# Patient Record
Sex: Female | Born: 1970 | Race: Black or African American | Hispanic: No | Marital: Single | Smoking: Never smoker
Health system: Southern US, Community
[De-identification: ages and names within clinical notes are randomized; demographics above are authoritative.]

## PROBLEM LIST (undated history)

## (undated) DIAGNOSIS — N390 Urinary tract infection, site not specified: Secondary | ICD-10-CM

## (undated) DIAGNOSIS — E669 Obesity, unspecified: Secondary | ICD-10-CM

## (undated) DIAGNOSIS — I10 Essential (primary) hypertension: Secondary | ICD-10-CM

## (undated) DIAGNOSIS — J849 Interstitial pulmonary disease, unspecified: Secondary | ICD-10-CM

## (undated) DIAGNOSIS — H409 Unspecified glaucoma: Secondary | ICD-10-CM

## (undated) DIAGNOSIS — J45909 Unspecified asthma, uncomplicated: Secondary | ICD-10-CM

## (undated) DIAGNOSIS — K219 Gastro-esophageal reflux disease without esophagitis: Secondary | ICD-10-CM

## (undated) HISTORY — DX: Obesity, unspecified: E66.9

## (undated) HISTORY — PX: ABDOMINAL HYSTERECTOMY: SHX81

## (undated) HISTORY — DX: Gastro-esophageal reflux disease without esophagitis: K21.9

## (undated) HISTORY — DX: Unspecified glaucoma: H40.9

## (undated) HISTORY — DX: Unspecified asthma, uncomplicated: J45.909

## (undated) HISTORY — DX: Urinary tract infection, site not specified: N39.0

---

## 2015-04-08 HISTORY — PX: LAPAROSCOPIC GASTRIC BAND REMOVAL WITH LAPAROSCOPIC GASTRIC SLEEVE RESECTION: SHX6498

## 2020-12-01 ENCOUNTER — Encounter (HOSPITAL_COMMUNITY): Payer: Self-pay | Admitting: *Deleted

## 2020-12-01 ENCOUNTER — Telehealth (HOSPITAL_COMMUNITY): Payer: Self-pay

## 2020-12-01 NOTE — Telephone Encounter (Signed)
[place] the [place]#[id number].

## 2020-12-01 NOTE — Telephone Encounter (Signed)
Called patient to see if she is interested in the Pulmonary Rehab Program. Patient expressed interest. Explained scheduling process and went over insurance, patient verbalized understanding.   Given to RN for review.

## 2020-12-01 NOTE — Progress Notes (Signed)
Received referral and [facility] [id number] [facility]Dr[facility] Shelle Iron for this pt to participate in pulmonary rehab with the the diagnosis of ILD. Clinical review of pt follow up appt on 11/10/20 Pulmonary office note. Pt appropriate for scheduling for Pulmonary rehab.  Will forward to support staff for scheduling and verification of insurance eligibility/benefits with pt consent. Alanson Aly, BSN Cardiac and Emergency planning/management officer

## 2020-12-07 ENCOUNTER — Telehealth (HOSPITAL_COMMUNITY): Payer: Self-pay

## 2020-12-07 NOTE — Telephone Encounter (Signed)
Pt called and stated she is interested in PR, pt will come in for orientation on 01/04/21 @ 9AM and will attend the 215PM class.  Mailed letter

## 2020-12-15 ENCOUNTER — Ambulatory Visit (HOSPITAL_COMMUNITY): Payer: Non-veteran care

## 2020-12-17 ENCOUNTER — Ambulatory Visit (HOSPITAL_COMMUNITY): Payer: Non-veteran care

## 2020-12-22 ENCOUNTER — Ambulatory Visit (HOSPITAL_COMMUNITY): Payer: Non-veteran care

## 2020-12-24 ENCOUNTER — Ambulatory Visit (HOSPITAL_COMMUNITY): Payer: Non-veteran care

## 2020-12-29 ENCOUNTER — Ambulatory Visit (HOSPITAL_COMMUNITY): Payer: Non-veteran care

## 2020-12-31 ENCOUNTER — Ambulatory Visit (HOSPITAL_COMMUNITY): Payer: Non-veteran care

## 2020-12-31 ENCOUNTER — Telehealth (HOSPITAL_COMMUNITY): Payer: Self-pay | Admitting: *Deleted

## 2021-01-04 ENCOUNTER — Encounter (HOSPITAL_COMMUNITY): Payer: Self-pay

## 2021-01-04 ENCOUNTER — Other Ambulatory Visit: Payer: Self-pay

## 2021-01-04 ENCOUNTER — Encounter (HOSPITAL_COMMUNITY)
Admission: RE | Admit: 2021-01-04 | Discharge: 2021-01-04 | Disposition: A | Discharge status: Home / Self Care | Payer: No Typology Code available for payment source | Source: Ambulatory Visit | Attending: Cardiology | Admitting: Cardiology

## 2021-01-04 VITALS — BP 114/80 | HR 91 | Ht 67.0 in | Wt 201.5 lb

## 2021-01-04 DIAGNOSIS — J849 Interstitial pulmonary disease, unspecified: Secondary | ICD-10-CM | POA: Diagnosis present

## 2021-01-04 HISTORY — DX: Interstitial pulmonary disease, unspecified: J84.9

## 2021-01-04 HISTORY — DX: Essential (primary) hypertension: I10

## 2021-01-04 NOTE — Progress Notes (Signed)
Chelsea Jennings 50 y.o. female Pulmonary Rehab Orientation Note This patient who was referred to Pulmonary rehab by Dr. Shelle Iron from the [facility] in [place] with the diagnosis of interstitial lung disease arrived today in Cardiac and Pulmonary Rehab. She arrived ambulatory with normal gait. She does not carry portable oxygen.  Per pt, she uses oxygen never. Color good, skin warm and dry. Patient is oriented to time and place. Patient's medical history, psychosocial health, and medications reviewed. Psychosocial assessment reveals pt lives alone. Pt is currently full time job working for [employer]. Pt hobbies include reading. Pt reports her stress level is low, she has worked remotely from home the last 2 years and is very happy that she will return to [employer] 1 day per week for now. Pt does not exhibit signs of depression.  PHQ2/9 score 0/0. Pt shows good  coping skills with positive outlook . Will continue to monitor and evaluate progress toward psychosocial goal(s) of continued mental well-being while participating in pulmonary rehab. Physical assessment reveals heart rate is normal, breath sounds clear to auscultation, no wheezes, rales, or rhonchi. Grip strength equal, strong. Distal pulses 3+ bilateral posterior tibial pulses present without peripheral edema. Patient reports she does take medications as prescribed. Patient states she follows a Regular diet. She has gained 20 pounds in the last 2 years she attributes to working from home and not being as active.. Patient's weight will be monitored closely. Demonstration and practice of PLB using pulse oximeter. Patient able to return demonstration satisfactorily. Safety and hand hygiene in the exercise area reviewed with patient. Patient voices understanding of the information reviewed. Department expectations discussed with patient and achievable goals were set. The patient shows enthusiasm about attending the program and we look forward to working with  this nice lady. The patient completed a 6 min walk test today, 01/04/2021 and to begin exercise on Tuesday, January 12, 2021 in the 10:15 am exercise slot.  [id number]

## 2021-01-04 NOTE — Progress Notes (Signed)
Pulmonary Individual Treatment Plan  Patient Details  Name: Chelsea Jennings MRN: [id number] Date of Birth: 12-31-1970 Referring Provider:   Doristine Devoid Pulmonary Rehab Walk Test from 01/04/2021 in [facility]  Referring Provider Dr. Tyler Pita)      Initial Encounter Date:  Flowsheet Row Pulmonary Rehab Walk Test from 01/04/2021 in [facility] CARDI[facility] REHAB  Date 01/04/21      Visit Diagnosis: Interstitial lung disease (HCC)  Patient's Home Medications on Admission:   Current Outpatient Medications:  .  amLODipine (NORVASC) 5 MG tablet, Take 5 mg by mouth daily., Disp: , Rfl:  .  cetirizine (ZYRTEC) 10 MG chewable tablet, Chew 10 mg by mouth daily., Disp: , Rfl:  .  chlorpheniramine (CHLOR-TRIMETON) 4 MG tablet, Take 4 mg by mouth 2 (two) times daily as needed for allergies., Disp: , Rfl:  .  clobetasol ointment (TEMOVATE) 0.05 %, Apply 1 application topically 2 (two) times daily., Disp: , Rfl:  .  dorzolamide-timolol (COSOPT) 22.3-6.8 MG/ML ophthalmic solution, 1 drop 2 (two) times daily., Disp: , Rfl:  .  hydroxychloroquine (PLAQUENIL) 200 MG tablet, Take 200 mg by mouth 2 (two) times daily., Disp: , Rfl:  .  latanoprost (XALATAN) 0.005 % ophthalmic solution, Place 1 drop into both eyes at bedtime., Disp: , Rfl:  .  mycophenolate (CELLCEPT) 500 MG tablet, Take 500 mg by mouth 2 (two) times daily., Disp: , Rfl:  .  omeprazole (PRILOSEC) 40 MG capsule, Take 40 mg by mouth daily., Disp: , Rfl:  .  sulfamethoxazole-trimethoprim (BACTRIM) 200-40 MG/5ML suspension, Take 160 mg by mouth once. 1 tablet Monday, Wednesday, and Friday, Disp: , Rfl:   Past Medical History: Past Medical History:  Diagnosis Date  . Hypertension   . Interstitial lung disease (HCC)     Tobacco Use: Social History   Tobacco Use  Smoking Status Not on file  Smokeless Tobacco Not on file    Labs: Recent Review Flowsheet Data   There is no flowsheet data to  display.     Capillary Blood Glucose: No results found for: GLUCAP   Pulmonary Assessment Scores:  Pulmonary Assessment Scores    Row Name 01/04/21 1052 01/04/21 1212       ADL UCSD   ADL Phase Entry -    SOB Score total 36 -         CAT Score   CAT Score 18 -         mMRC Score   mMRC Score - 2          UCSD: Self-administered rating of dyspnea associated with activities of daily living (ADLs) 6-point scale (0 = "not at all" to 5 = "maximal or unable to do because of breathlessness")  Scoring Scores range from 0 to 120.  Minimally important difference is 5 units  CAT: CAT can identify the health impairment of COPD patients and is better correlated with disease progression.  CAT has a scoring range of zero to 40. The CAT score is classified into four groups of low (less than 10), medium (10 - 20), high (21-30) and very high (31-40) based on the impact level of disease on health status. A CAT score over 10 suggests significant symptoms.  A worsening CAT score could be explained by an exacerbation, poor medication adherence, poor inhaler technique, or progression of COPD or comorbid conditions.  CAT MCID is 2 points  mMRC: mMRC (Modified Medical Research Council) Dyspnea Scale is used to assess the  degree of baseline functional disability in patients of respiratory disease due to dyspnea. No minimal important difference is established. A decrease in score of 1 point or greater is considered a positive change.   Pulmonary Function Assessment:  Pulmonary Function Assessment - 01/04/21 0959      Breath   Bilateral Breath Sounds Clear    Shortness of Breath Yes;Limiting activity;Panic with Shortness of Breath;Fear of Shortness of Breath           Exercise Target Goals: Exercise Program Goal: Individual exercise prescription set using results from initial 6 min walk test and THRR while considering  patient's activity barriers and safety.   Exercise Prescription  Goal: Initial exercise prescription builds to 30-45 minutes a day of aerobic activity, 2-3 days per week.  Home exercise guidelines will be given to patient during program as part of exercise prescription that the participant will acknowledge.  Activity Barriers & Risk Stratification:  Activity Barriers & Cardiac Risk Stratification - 01/04/21 0954      Activity Barriers & Cardiac Risk Stratification   Activity Barriers Deconditioning;Muscular Weakness;Shortness of Breath           6 Minute Walk:  6 Minute Walk    Row Name 01/04/21 1051         6 Minute Walk   Phase Initial     Distance 1000 feet     Walk Time 6 minutes     # of Rest Breaks 0  Had to stop pt a couple of times due to pulse ox not picking up oxygen saturation     MPH 1.89     METS 3.32     RPE 11     Perceived Dyspnea  1     VO2 Peak 11.62     Symptoms Yes (comment)     Comments Pt has a dry cough with any type of exertion     Resting HR 88 bpm     Resting BP 116/78     Resting Oxygen Saturation  100 %     Exercise Oxygen Saturation  during 6 min walk 88 %     Max Ex. HR 101 bpm     Max Ex. BP 134/84     2 Minute Post BP 134/84           Interval HR   1 Minute HR 101     2 Minute HR 98     3 Minute HR 100     4 Minute HR 101     5 Minute HR 101     6 Minute HR 95     2 Minute Post HR 82     Interval Heart Rate? Yes           Interval Oxygen   Interval Oxygen? Yes     Baseline Oxygen Saturation % 100 %     1 Minute Oxygen Saturation % 98 %     1 Minute Liters of Oxygen 0 L     2 Minute Oxygen Saturation % 97 %     2 Minute Liters of Oxygen 0 L     3 Minute Oxygen Saturation % 97 %     3 Minute Liters of Oxygen 0 L     4 Minute Oxygen Saturation % 88 %  Pulse ox was not reading correctly. Do not think this 88% is accurate. Had pt stop for a second and oxygen saturation immediately increase to 99%.  4 Minute Liters of Oxygen 0 L     5 Minute Oxygen Saturation % 99 %     5 Minute Liters of  Oxygen 0 L     6 Minute Oxygen Saturation % 98 %     6 Minute Liters of Oxygen 0 L     2 Minute Post Oxygen Saturation % 100 %     2 Minute Post Liters of Oxygen 0 L            Oxygen Initial Assessment:  Oxygen Initial Assessment - 01/04/21 0959      Home Oxygen   Home Oxygen Device None    Sleep Oxygen Prescription None    Home Exercise Oxygen Prescription None    Home Resting Oxygen Prescription None      Initial 6 min Walk   Oxygen Used None      Program Oxygen Prescription   Program Oxygen Prescription None      Intervention   Short Term Goals To learn and understand importance of monitoring SPO2 with pulse oximeter and demonstrate accurate use of the pulse oximeter.;To learn and understand importance of maintaining oxygen saturations>88%;To learn and demonstrate proper pursed lip breathing techniques or other breathing techniques.;To learn and demonstrate proper use of respiratory medications    Long  Term Goals Verbalizes importance of monitoring SPO2 with pulse oximeter and return demonstration;Maintenance of O2 saturations>88%;Exhibits proper breathing techniques, such as pursed lip breathing or other method taught during program session;Compliance with respiratory medication;Demonstrates proper use of MDI's           Oxygen Re-Evaluation:   Oxygen Discharge (Final Oxygen Re-Evaluation):   Initial Exercise Prescription:  Initial Exercise Prescription - 01/04/21 1000      Date of Initial Exercise RX and Referring Provider   Date 01/04/21    Referring Provider Dr. Tyler Pita)    Expected Discharge Date 03/11/21      Treadmill   MPH 1.8    Minutes 15      NuStep   Level 2    SPM 80    Minutes 15      Prescription Details   Frequency (times per week) 2    Duration Progress to 30 minutes of continuous aerobic without signs/symptoms of physical distress      Intensity   THRR 40-80% of Max Heartrate 68-137    Ratings of Perceived Exertion 11-13     Perceived Dyspnea 0-4      Progression   Progression Continue to progress workloads to maintain intensity without signs/symptoms of physical distress.      Resistance Training   Training Prescription Yes    Weight blue bands    Reps 10-15           Perform Capillary Blood Glucose checks as needed.  Exercise Prescription Changes:   Exercise Comments:   Exercise Goals and Review:  Exercise Goals    Row Name 01/04/21 1050             Exercise Goals   Increase Physical Activity Yes       Intervention Provide advice, education, support and counseling about physical activity/exercise needs.;Develop an individualized exercise prescription for aerobic and resistive training based on initial evaluation findings, risk stratification, comorbidities and participant's personal goals.       Expected Outcomes Short Term: Attend rehab on a regular basis to increase amount of physical activity.;Long Term: Add in home exercise to make exercise part of routine and to increase amount of physical  activity.;Long Term: Exercising regularly at least 3-5 days a week.       Increase Strength and Stamina Yes       Intervention Provide advice, education, support and counseling about physical activity/exercise needs.;Develop an individualized exercise prescription for aerobic and resistive training based on initial evaluation findings, risk stratification, comorbidities and participant's personal goals.       Expected Outcomes Short Term: Increase workloads from initial exercise prescription for resistance, speed, and METs.;Short Term: Perform resistance training exercises routinely during rehab and add in resistance training at home;Long Term: Improve cardiorespiratory fitness, muscular endurance and strength as measured by increased METs and functional capacity (6MWT)       Able to understand and use rate of perceived exertion (RPE) scale Yes       Intervention Provide education and explanation on how to  use RPE scale       Expected Outcomes Short Term: Able to use RPE daily in rehab to express subjective intensity level;Long Term:  Able to use RPE to guide intensity level when exercising independently       Able to understand and use Dyspnea scale Yes       Intervention Provide education and explanation on how to use Dyspnea scale       Expected Outcomes Short Term: Able to use Dyspnea scale daily in rehab to express subjective sense of shortness of breath during exertion;Long Term: Able to use Dyspnea scale to guide intensity level when exercising independently       Knowledge and understanding of Target Heart Rate Range (THRR) Yes       Intervention Provide education and explanation of THRR including how the numbers were predicted and where they are located for reference       Expected Outcomes Short Term: Able to state/look up THRR;Long Term: Able to use THRR to govern intensity when exercising independently;Short Term: Able to use daily as guideline for intensity in rehab       Understanding of Exercise Prescription Yes       Intervention Provide education, explanation, and written materials on patient's individual exercise prescription       Expected Outcomes Short Term: Able to explain program exercise prescription;Long Term: Able to explain home exercise prescription to exercise independently              Exercise Goals Re-Evaluation :   Discharge Exercise Prescription (Final Exercise Prescription Changes):   Nutrition:  Target Goals: Understanding of nutrition guidelines, daily intake of sodium 1500mg , cholesterol 200mg , calories 30% from fat and 7% or less from saturated fats, daily to have 5 or more servings of fruits and vegetables.  Biometrics:  Pre Biometrics - 01/04/21 0955      Pre Biometrics   Height 5\' 7"  (1.702 m)    Weight 91.4 kg    BMI (Calculated) 31.55    Grip Strength 24 kg            Nutrition Therapy Plan and Nutrition Goals:   Nutrition  Assessments:  MEDIFICTS Score Key:  ?70 Need to make dietary changes   40-70 Heart Healthy Diet  ? 40 Therapeutic Level Cholesterol Diet   Picture Your Plate Scores:  <16<40 Unhealthy dietary pattern with much room for improvement.  41-50 Dietary pattern unlikely to meet recommendations for good health and room for improvement.  51-60 More healthful dietary pattern, with some room for improvement.   >60 Healthy dietary pattern, although there may be some specific behaviors that could be improved.  Nutrition Goals Re-Evaluation:   Nutrition Goals Discharge (Final Nutrition Goals Re-Evaluation):   Psychosocial: Target Goals: Acknowledge presence or absence of significant depression and/or stress, maximize coping skills, provide positive support system. Participant is able to verbalize types and ability to use techniques and skills needed for reducing stress and depression.  Initial Review & Psychosocial Screening:  Initial Psych Review & Screening - 01/04/21 1003      Initial Review   Current issues with None Identified      Family Dynamics   Good Support System? Yes   friends and family     Barriers   Psychosocial barriers to participate in program There are no identifiable barriers or psychosocial needs.      Screening Interventions   Interventions Encouraged to exercise    Expected Outcomes Long Term goal: The participant improves quality of Life and PHQ9 Scores as seen by post scores and/or verbalization of changes           Quality of Life Scores:  Scores of 19 and below usually indicate a poorer quality of life in these areas.  A difference of  2-3 points is a clinically meaningful difference.  A difference of 2-3 points in the total score of the Quality of Life Index has been associated with significant improvement in overall quality of life, self-image, physical symptoms, and general health in studies assessing change in quality of life.  PHQ-9: Recent  Review Flowsheet Data    Depression screen [facility] 2/9 01/04/2021   Decreased Interest 0   Down, Depressed, Hopeless 0   PHQ - 2 Score 0   Altered sleeping 0   Tired, decreased energy 0   Change in appetite 0   Feeling bad or failure about yourself  0   Trouble concentrating 0   Moving slowly or fidgety/restless 0   Suicidal thoughts 0   Difficult doing work/chores Not difficult at all     Interpretation of Total Score  Total Score Depression Severity:  1-4 = Minimal depression, 5-9 = Mild depression, 10-14 = Moderate depression, 15-19 = Moderately severe depression, 20-27 = Severe depression   Psychosocial Evaluation and Intervention:  Psychosocial Evaluation - 01/04/21 1005      Psychosocial Evaluation & Interventions   Interventions Encouraged to exercise with the program and follow exercise prescription    Continue Psychosocial Services  No Follow up required           Psychosocial Re-Evaluation:   Psychosocial Discharge (Final Psychosocial Re-Evaluation):   Education: Education Goals: Education classes will be provided on a weekly basis, covering required topics. Participant will state understanding/return demonstration of topics presented.  Learning Barriers/Preferences:  Learning Barriers/Preferences - 01/04/21 1005      Learning Barriers/Preferences   Learning Barriers None    Learning Preferences Written Material;Verbal Instruction;Individual Instruction           Education Topics: Risk Factor Reduction:  -Group instruction that is supported by a PowerPoint presentation. Instructor discusses the definition of a risk factor, different risk factors for pulmonary disease, and how the heart and lungs work together.     Nutrition for Pulmonary Patient:  -Group instruction provided by PowerPoint slides, verbal discussion, and written materials to support subject matter. The instructor gives an explanation and review of healthy diet recommendations, which includes  a discussion on weight management, recommendations for fruit and vegetable consumption, as well as protein, fluid, caffeine, fiber, sodium, sugar, and alcohol. Tips for eating when patients are short of breath are discussed.  Pursed Lip Breathing:  -Group instruction that is supported by demonstration and informational handouts. Instructor discusses the benefits of pursed lip and diaphragmatic breathing and detailed demonstration on how to preform both.     Oxygen Safety:  -Group instruction provided by PowerPoint, verbal discussion, and written material to support subject matter. There is an overview of "What is Oxygen" and "Why do we need it".  Instructor also reviews how to create a safe environment for oxygen use, the importance of using oxygen as prescribed, and the risks of noncompliance. There is a brief discussion on traveling with oxygen and resources the patient may utilize.   Oxygen Equipment:  -Group instruction provided by [facility] Staff utilizing handouts, written materials, and equipment demonstrations.   Signs and Symptoms:  -Group instruction provided by written material and verbal discussion to support subject matter. Warning signs and symptoms of infection, stroke, and heart attack are reviewed and when to call the physician/911 reinforced. Tips for preventing the spread of infection discussed.   Advanced Directives:  -Group instruction provided by verbal instruction and written material to support subject matter. Instructor reviews Advanced Directive laws and proper instruction for filling out document.   Pulmonary Video:  -Group video education that reviews the importance of medication and oxygen compliance, exercise, good nutrition, pulmonary hygiene, and pursed lip and diaphragmatic breathing for the pulmonary patient.   Exercise for the Pulmonary Patient:  -Group instruction that is supported by a PowerPoint presentation. Instructor discusses benefits of  exercise, core components of exercise, frequency, duration, and intensity of an exercise routine, importance of utilizing pulse oximetry during exercise, safety while exercising, and options of places to exercise outside of rehab.     Pulmonary Medications:  -Verbally interactive group education provided by instructor with focus on inhaled medications and proper administration.   Anatomy and Physiology of the Respiratory System and Intimacy:  -Group instruction provided by PowerPoint, verbal discussion, and written material to support subject matter. Instructor reviews respiratory cycle and anatomical components of the respiratory system and their functions. Instructor also reviews differences in obstructive and restrictive respiratory diseases with examples of each. Intimacy, Sex, and Sexuality differences are reviewed with a discussion on how relationships can change when diagnosed with pulmonary disease. Common sexual concerns are reviewed.   MD DAY -A group question and answer session with a medical doctor that allows participants to ask questions that relate to their pulmonary disease state.   OTHER EDUCATION -Group or individual verbal, written, or video instructions that support the educational goals of the pulmonary rehab program.   Holiday Eating Survival Tips:  -Group instruction provided by PowerPoint slides, verbal discussion, and written materials to support subject matter. The instructor gives patients tips, tricks, and techniques to help them not only survive but enjoy the holidays despite the onslaught of food that accompanies the holidays.   Knowledge Questionnaire Score:  Knowledge Questionnaire Score - 01/04/21 1051      Knowledge Questionnaire Score   Pre Score 15/18           Core Components/Risk Factors/Patient Goals at Admission:  Personal Goals and Risk Factors at Admission - 01/04/21 1005      Core Components/Risk Factors/Patient Goals on Admission    Improve shortness of breath with ADL's Yes           Core Components/Risk Factors/Patient Goals Review:   Goals and Risk Factor Review    Row Name 01/04/21 1006  Core Components/Risk Factors/Patient Goals Review   Personal Goals Review Develop more efficient breathing techniques such as purse lipped breathing and diaphragmatic breathing and practicing self-pacing with activity.;Increase knowledge of respiratory medications and ability to use respiratory devices properly.;Improve shortness of breath with ADL's              Core Components/Risk Factors/Patient Goals at Discharge (Final Review):   Goals and Risk Factor Review - 01/04/21 1006      Core Components/Risk Factors/Patient Goals Review   Personal Goals Review Develop more efficient breathing techniques such as purse lipped breathing and diaphragmatic breathing and practicing self-pacing with activity.;Increase knowledge of respiratory medications and ability to use respiratory devices properly.;Improve shortness of breath with ADL's           ITP Comments:   Comments:

## 2021-01-05 ENCOUNTER — Ambulatory Visit (HOSPITAL_COMMUNITY): Payer: Non-veteran care

## 2021-01-07 ENCOUNTER — Ambulatory Visit (HOSPITAL_COMMUNITY): Payer: Non-veteran care

## 2021-01-12 ENCOUNTER — Ambulatory Visit (HOSPITAL_COMMUNITY): Payer: Non-veteran care

## 2021-01-12 ENCOUNTER — Other Ambulatory Visit: Payer: Self-pay

## 2021-01-12 ENCOUNTER — Encounter (HOSPITAL_COMMUNITY): Payer: No Typology Code available for payment source

## 2021-01-12 ENCOUNTER — Encounter (HOSPITAL_COMMUNITY)
Admission: RE | Admit: 2021-01-12 | Discharge: 2021-01-12 | Disposition: A | Discharge status: Home / Self Care | Payer: No Typology Code available for payment source | Source: Ambulatory Visit | Attending: Cardiology | Admitting: Cardiology

## 2021-01-12 VITALS — Wt 203.5 lb

## 2021-01-12 DIAGNOSIS — J849 Interstitial pulmonary disease, unspecified: Secondary | ICD-10-CM | POA: Diagnosis present

## 2021-01-12 NOTE — Progress Notes (Signed)
Daily Session Note  Patient Details  Name: Chelsea Jennings MRN: [id number] Date of Birth: June 27, 1971 Referring Provider:   April Manson Pulmonary Rehab Walk Test from 01/04/2021 in [place]  Referring Provider Dr. Georgetta Haber)      Encounter Date: 01/12/2021  Check In:  Session Check In - 01/12/21 1138      Check-In   Supervising physician immediately available to respond to emergencies [facility] immediately available    Physician(s) Dr. Doristine Bosworth    Location [facility]    Staff Present Rosebud Poles, RN, BSN;Lisa Ysidro Evert, RN;Jessica Hassell Done, MS, ACSM-CEP, Exercise Physiologist    Virtual Visit No    Medication changes reported     No    Fall or balance concerns reported    No    Tobacco Cessation No Change    Warm-up and Cool-down Performed on first and last piece of equipment    Resistance Training Performed Yes    VAD Patient? No    PAD/SET Patient? No      Pain Assessment   Currently in Pain? No/denies    Multiple Pain Sites No           Capillary Blood Glucose: No results found for this or any previous visit (from the past 24 hour(s)).   Exercise Prescription Changes - 01/12/21 1100      Response to Exercise   Blood Pressure (Admit) 112/66    Blood Pressure (Exercise) 144/86    Blood Pressure (Exit) 100/60    Heart Rate (Admit) 99 bpm    Heart Rate (Exercise) 121 bpm    Heart Rate (Exit) 82 bpm    Oxygen Saturation (Admit) 99 %    Oxygen Saturation (Exercise) 97 %    Oxygen Saturation (Exit) 100 %    Rating of Perceived Exertion (Exercise) 11    Perceived Dyspnea (Exercise) 1    Duration Continue with 30 min of aerobic exercise without signs/symptoms of physical distress.    Intensity Other (comment)   40%-80% HR Max     Progression   Progression Continue to progress workloads to maintain intensity without signs/symptoms of physical distress.      Resistance Training   Training Prescription Yes     Weight blue bands    Reps 10-15    Time 10 Minutes      Treadmill   MPH 1.8    Grade 0    Minutes 15      NuStep   Level 2    SPM 80    Minutes 15    METs 2.3           Social History   Tobacco Use  Smoking Status Not on file  Smokeless Tobacco Not on file    Goals Met:  Proper associated with RPD/PD & O2 Sat Exercise tolerated well No report of cardiac concerns or symptoms Strength training completed today  Goals Unmet:  Not Applicable  Comments: Service time is from 1015 to 1110 Patient completed first day of exercise and tolerated well with no complaints or concerns.    Dr. Fransico Him is Medical Director for Cardiac Rehab at [facility].

## 2021-01-14 ENCOUNTER — Encounter (HOSPITAL_COMMUNITY): Payer: No Typology Code available for payment source

## 2021-01-14 ENCOUNTER — Other Ambulatory Visit: Payer: Self-pay

## 2021-01-14 ENCOUNTER — Encounter (HOSPITAL_COMMUNITY)
Admission: RE | Admit: 2021-01-14 | Discharge: 2021-01-14 | Disposition: A | Discharge status: Home / Self Care | Payer: No Typology Code available for payment source | Source: Ambulatory Visit | Attending: Cardiology | Admitting: Cardiology

## 2021-01-14 ENCOUNTER — Ambulatory Visit (HOSPITAL_COMMUNITY): Payer: Non-veteran care

## 2021-01-14 DIAGNOSIS — J849 Interstitial pulmonary disease, unspecified: Secondary | ICD-10-CM

## 2021-01-14 NOTE — Progress Notes (Signed)
Chelsea Jennings 50 y.o. female Nutrition Note  Diagnosis:ILD  Past Medical History:  Diagnosis Date  . Hypertension   . Interstitial lung disease (HCC)      Medications reviewed.   Current Outpatient Medications:  .  amLODipine (NORVASC) 5 MG tablet, Take 5 mg by mouth daily., Disp: , Rfl:  .  cetirizine (ZYRTEC) 10 MG chewable tablet, Chew 10 mg by mouth daily., Disp: , Rfl:  .  chlorpheniramine (CHLOR-TRIMETON) 4 MG tablet, Take 4 mg by mouth 2 (two) times daily as needed for allergies., Disp: , Rfl:  .  clobetasol ointment (TEMOVATE) 0.05 %, Apply 1 application topically 2 (two) times daily., Disp: , Rfl:  .  dorzolamide-timolol (COSOPT) 22.3-6.8 MG/ML ophthalmic solution, 1 drop 2 (two) times daily., Disp: , Rfl:  .  hydroxychloroquine (PLAQUENIL) 200 MG tablet, Take 200 mg by mouth 2 (two) times daily., Disp: , Rfl:  .  latanoprost (XALATAN) 0.005 % ophthalmic solution, Place 1 drop into both eyes at bedtime., Disp: , Rfl:  .  mycophenolate (CELLCEPT) 500 MG tablet, Take 500 mg by mouth 2 (two) times daily., Disp: , Rfl:  .  omeprazole (PRILOSEC) 40 MG capsule, Take 40 mg by mouth daily., Disp: , Rfl:  .  sulfamethoxazole-trimethoprim (BACTRIM) 200-40 MG/5ML suspension, Take 160 mg by mouth once. 1 tablet Monday, Wednesday, and Friday, Disp: , Rfl:    Ht Readings from Last 1 Encounters:  01/04/21 5\' 7"  (1.702 m)     Wt Readings from Last 3 Encounters:  01/12/21 203 lb 7.8 oz (92.3 kg)  01/04/21 201 lb 8 oz (91.4 kg)     There is no height or weight on file to calculate BMI.   Social History   Tobacco Use  Smoking Status Not on file  Smokeless Tobacco Not on file      Nutrition Note  Spoke with pt. Nutrition Plan and Nutrition Survey goals reviewed with pt.   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 She is paying attention to carbs and sugar now.   Acid reflux: yes. Controlled on medication.  Taking CellCept after meals.   Discussed  sodium and HTN.  Pt is mainly concerned with her weight. Had bariatric surgery 6 years ago. She lost 120 lbs. She has remained at goal weight of 170 lbs until October 2021. She gained 20-30 lbs due to mental stress of COVID19 and a change in plans to go back to work. She has been exercising less due to SOB.  She is planning to continue exercising more and has started getting 10 k steps in per day. She wants to incorporate more vegetables. Provided recipes.   Pt expressed understanding of the information reviewed.   Nutrition Diagnosis ? Food-and nutrition-related knowledge deficit related to lack of exposure to information as related to diagnosis of: ? ILD, HTN, and goal of weight loss  Nutrition Intervention ? Pt's individual nutrition plan reviewed with pt. ? Benefits of adopting healthy diet reviewed with Rate My Plate survey ? Continue client-centered nutrition education by RD, as part of interdisciplinary care.  Goal(s) ? Pt to identify food quantities necessary to achieve weight loss of 6-24 lb at graduation from cardiac rehab.  ? Pt to build a healthy plate including vegetables, fruits, whole grains, and low-fat dairy products in a heart healthy meal plan.  Plan:   Will provide client-centered nutrition education as part of interdisciplinary care  Monitor and evaluate progress toward nutrition goal with team.   November 2021  Earlene Plater, MS, RDN, LDN

## 2021-01-14 NOTE — Progress Notes (Signed)
Daily Session Note  Patient Details  Name: Chelsea Jennings MRN: [id number] Date of Birth: 06/14/1971 Referring Provider:   April Manson Pulmonary Rehab Walk Test from 01/04/2021 in [place]  Referring Provider Dr. Georgetta Haber)      Encounter Date: 01/14/2021  Check In:  Session Check In - 01/14/21 1139      Check-In   Supervising physician immediately available to respond to emergencies [facility] immediately available    Physician(s) Dr. Arbutus Ped    [facility]    Staff Present Rosebud Poles, RN, BSN;Lisa Ysidro Evert, RN;Jessica Hassell Done, MS, ACSM-CEP, Exercise Physiologist    Virtual Visit No    Medication changes reported     No    Fall or balance concerns reported    No    Tobacco Cessation No Change    Warm-up and Cool-down Performed on first and last piece of equipment    Resistance Training Performed Yes    VAD Patient? No    PAD/SET Patient? No      Pain Assessment   Currently in Pain? No/denies    Multiple Pain Sites No           Capillary Blood Glucose: No results found for this or any previous visit (from the past 24 hour(s)).    Social History   Tobacco Use  Smoking Status Not on file  Smokeless Tobacco Not on file    Goals Met:  Proper associated with RPD/PD & O2 Sat Exercise tolerated well No report of cardiac concerns or symptoms Strength training completed today  Goals Unmet:  Not Applicable  Comments: Service time is from 1010 to 1125    Dr. Fransico Him is Medical Director for Cardiac Rehab at [facility].

## 2021-01-19 ENCOUNTER — Encounter (HOSPITAL_COMMUNITY): Payer: No Typology Code available for payment source

## 2021-01-19 ENCOUNTER — Ambulatory Visit (HOSPITAL_COMMUNITY): Payer: Non-veteran care

## 2021-01-19 ENCOUNTER — Encounter (HOSPITAL_COMMUNITY)
Admission: RE | Admit: 2021-01-19 | Discharge: 2021-01-19 | Disposition: A | Discharge status: Home / Self Care | Payer: No Typology Code available for payment source | Source: Ambulatory Visit | Attending: Cardiology | Admitting: Cardiology

## 2021-01-19 ENCOUNTER — Other Ambulatory Visit: Payer: Self-pay

## 2021-01-19 DIAGNOSIS — J849 Interstitial pulmonary disease, unspecified: Secondary | ICD-10-CM

## 2021-01-19 NOTE — Progress Notes (Signed)
Daily Session Note  Patient Details  Name: Chelsea Jennings MRN: [id number] Date of Birth: 08/27/1971 Referring Provider:   April Manson Pulmonary Rehab Walk Test from 01/04/2021 in [place]  Referring Provider Dr. Georgetta Haber)      Encounter Date: 01/19/2021  Check In:  Session Check In - 01/19/21 1043      Check-In   Supervising physician immediately available to respond to emergencies [facility] immediately available    Physician(s) Dr. Arbutus Ped    [facility]    Staff Present Rosebud Poles, RN, BSN;Carlette Wilber Oliphant, RN, Isaac Laud, MS, ACSM-CEP, Exercise Physiologist    Virtual Visit No    Medication changes reported     No    Fall or balance concerns reported    No    Tobacco Cessation No Change    Warm-up and Cool-down Performed on first and last piece of equipment    Resistance Training Performed Yes    VAD Patient? No    PAD/SET Patient? No      Pain Assessment   Currently in Pain? No/denies    Multiple Pain Sites No           Capillary Blood Glucose: No results found for this or any previous visit (from the past 24 hour(s)).    Social History   Tobacco Use  Smoking Status Not on file  Smokeless Tobacco Not on file    Goals Met:  Proper associated with RPD/PD & O2 Sat Exercise tolerated well No report of cardiac concerns or symptoms Strength training completed today  Goals Unmet:  Not Applicable  Comments: Service time is from 1003 to 50    Dr. Fransico Him is Medical Director for Cardiac Rehab at [facility].

## 2021-01-21 ENCOUNTER — Other Ambulatory Visit: Payer: Self-pay

## 2021-01-21 ENCOUNTER — Encounter (HOSPITAL_COMMUNITY)
Admission: RE | Admit: 2021-01-21 | Discharge: 2021-01-21 | Disposition: A | Discharge status: Home / Self Care | Payer: No Typology Code available for payment source | Source: Ambulatory Visit | Attending: Cardiology | Admitting: Cardiology

## 2021-01-21 ENCOUNTER — Ambulatory Visit (HOSPITAL_COMMUNITY): Payer: Non-veteran care

## 2021-01-21 ENCOUNTER — Encounter (HOSPITAL_COMMUNITY): Payer: No Typology Code available for payment source

## 2021-01-21 DIAGNOSIS — J849 Interstitial pulmonary disease, unspecified: Secondary | ICD-10-CM

## 2021-01-21 NOTE — Progress Notes (Signed)
Daily Session Note  Patient Details  Name: Chelsea Jennings MRN: [id number] Date of Birth: 1971-04-15 Referring Provider:   April Manson Pulmonary Rehab Walk Test from 01/04/2021 in [place]  Referring Provider Dr. Georgetta Haber)      Encounter Date: 01/21/2021  Check In:  Session Check In - 01/21/21 1018      Check-In   Supervising physician immediately available to respond to emergencies [facility] immediately available    Physician(s) Dr. Florencia Reasons    [facility]    Staff Present Rosebud Poles, RN, Milus Glazier, MS, EP-C, CCRP;Jessica Hassell Done, MS, ACSM-CEP, Exercise Physiologist    Virtual Visit No    Medication changes reported     No    Fall or balance concerns reported    No    Tobacco Cessation No Change    Warm-up and Cool-down Performed on first and last piece of equipment    Resistance Training Performed Yes    VAD Patient? No    PAD/SET Patient? No      Pain Assessment   Currently in Pain? No/denies    Multiple Pain Sites No           Capillary Blood Glucose: No results found for this or any previous visit (from the past 24 hour(s)).    Social History   Tobacco Use  Smoking Status Not on file  Smokeless Tobacco Not on file    Goals Met:  Proper associated with RPD/PD & O2 Sat Exercise tolerated well Strength training completed today  Goals Unmet:  Not Applicable  Comments: Service time is from 1000 to 30    Dr. Fransico Him is Medical Director for Cardiac Rehab at [facility].

## 2021-01-26 ENCOUNTER — Other Ambulatory Visit: Payer: Self-pay

## 2021-01-26 ENCOUNTER — Encounter (HOSPITAL_COMMUNITY): Payer: No Typology Code available for payment source

## 2021-01-26 ENCOUNTER — Encounter (HOSPITAL_COMMUNITY)
Admission: RE | Admit: 2021-01-26 | Discharge: 2021-01-26 | Disposition: A | Discharge status: Home / Self Care | Payer: No Typology Code available for payment source | Source: Ambulatory Visit | Attending: Cardiology | Admitting: Cardiology

## 2021-01-26 ENCOUNTER — Ambulatory Visit (HOSPITAL_COMMUNITY): Payer: Non-veteran care

## 2021-01-26 VITALS — Wt 201.5 lb

## 2021-01-26 DIAGNOSIS — J849 Interstitial pulmonary disease, unspecified: Secondary | ICD-10-CM | POA: Diagnosis not present

## 2021-01-26 NOTE — Progress Notes (Signed)
Daily Session Note  Patient Details  Name: Chelsea Jennings MRN: [id number] Date of Birth: [birth year] Referring Provider:   April Manson Pulmonary Rehab Walk Test from 01/04/2021 in [place]  Referring Provider Dr. Georgetta Haber)      Encounter Date: 01/26/2021  Check In:  Session Check In - 01/26/21 1017      Check-In   Supervising physician immediately available to respond to emergencies [facility] immediately available    Physician(s) Dr. Tawanna Solo    [facility]    Staff Present Rosebud Poles, RN, BSN;Damonie Ellenwood Ysidro Evert, RN;Jessica Hassell Done, MS, ACSM-CEP, Exercise Physiologist    Virtual Visit No    Medication changes reported     No    Fall or balance concerns reported    No    Tobacco Cessation No Change    Warm-up and Cool-down Performed on first and last piece of equipment    Resistance Training Performed Yes    VAD Patient? No    PAD/SET Patient? No      Pain Assessment   Currently in Pain? No/denies    Multiple Pain Sites No           Capillary Blood Glucose: No results found for this or any previous visit (from the past 24 hour(s)).   Exercise Prescription Changes - 01/26/21 1100      Response to Exercise   Blood Pressure (Admit) 110/64    Blood Pressure (Exercise) 118/76    Blood Pressure (Exit) 114/66    Heart Rate (Admit) 90 bpm    Heart Rate (Exercise) 107 bpm    Heart Rate (Exit) 87 bpm    Oxygen Saturation (Admit) 100 %    Oxygen Saturation (Exercise) 97 %    Oxygen Saturation (Exit) 100 %    Rating of Perceived Exertion (Exercise) 10    Perceived Dyspnea (Exercise) 2    Duration Progress to 30 minutes of  aerobic without signs/symptoms of physical distress    Intensity THRR unchanged      Progression   Progression Continue to progress workloads to maintain intensity without signs/symptoms of physical distress.      Resistance Training   Training Prescription Yes    Weight blue bands     Reps 10-15    Time 10 Minutes      Treadmill   MPH 2.2    Grade 0    Minutes 15      NuStep   Level 3    SPM 80    Minutes 15    METs 2.5           Social History   Tobacco Use  Smoking Status Not on file  Smokeless Tobacco Not on file    Goals Met:  Exercise tolerated well No report of cardiac concerns or symptoms Strength training completed today  Goals Unmet:  Not Applicable  Comments: Service time is from 1000 to 1105    Dr. Fransico Him is Medical Director for Cardiac Rehab at [facility].

## 2021-01-28 ENCOUNTER — Encounter (HOSPITAL_COMMUNITY): Payer: No Typology Code available for payment source

## 2021-01-28 ENCOUNTER — Ambulatory Visit (HOSPITAL_COMMUNITY): Payer: Non-veteran care

## 2021-01-28 ENCOUNTER — Other Ambulatory Visit: Payer: Self-pay

## 2021-01-28 ENCOUNTER — Encounter (HOSPITAL_COMMUNITY)
Admission: RE | Admit: 2021-01-28 | Discharge: 2021-01-28 | Disposition: A | Discharge status: Home / Self Care | Payer: No Typology Code available for payment source | Source: Ambulatory Visit | Attending: Cardiology | Admitting: Cardiology

## 2021-01-28 DIAGNOSIS — J849 Interstitial pulmonary disease, unspecified: Secondary | ICD-10-CM

## 2021-01-28 NOTE — Progress Notes (Signed)
Daily Session Note  Patient Details  Name: Chelsea Jennings MRN: [id number] Date of Birth: [birth year] Referring Provider:   April Manson Pulmonary Rehab Walk Test from 01/04/2021 in [place]  Referring Provider Dr. Georgetta Haber)      Encounter Date: 01/28/2021  Check In:  Session Check In - 01/28/21 1018      Check-In   Supervising physician immediately available to respond to emergencies [facility] immediately available    Physician(s) Dr. Tawanna Solo    [facility]    Staff Present Rosebud Poles, RN, BSN;Lisa Ysidro Evert, RN;Jessica Hassell Done, MS, ACSM-CEP, Exercise Physiologist    Virtual Visit No    Medication changes reported     No    Fall or balance concerns reported    No    Tobacco Cessation No Change    Warm-up and Cool-down Performed on first and last piece of equipment    Resistance Training Performed Yes    VAD Patient? No    PAD/SET Patient? No      Pain Assessment   Currently in Pain? No/denies    Multiple Pain Sites No           Capillary Blood Glucose: No results found for this or any previous visit (from the past 24 hour(s)).    Social History   Tobacco Use  Smoking Status Not on file  Smokeless Tobacco Not on file    Goals Met:  Proper associated with RPD/PD & O2 Sat Exercise tolerated well Strength training completed today  Goals Unmet:  Not Applicable  Comments: Service time is from 1003 to 1105.    Dr. Fransico Him is Medical Director for Cardiac Rehab at [facility].

## 2021-02-02 ENCOUNTER — Encounter (HOSPITAL_COMMUNITY)
Admission: RE | Admit: 2021-02-02 | Discharge: 2021-02-02 | Disposition: A | Discharge status: Home / Self Care | Payer: No Typology Code available for payment source | Source: Ambulatory Visit | Attending: Cardiology | Admitting: Cardiology

## 2021-02-02 ENCOUNTER — Encounter (HOSPITAL_COMMUNITY): Payer: No Typology Code available for payment source

## 2021-02-02 ENCOUNTER — Other Ambulatory Visit: Payer: Self-pay

## 2021-02-02 ENCOUNTER — Ambulatory Visit (HOSPITAL_COMMUNITY): Payer: Non-veteran care

## 2021-02-02 DIAGNOSIS — J849 Interstitial pulmonary disease, unspecified: Secondary | ICD-10-CM | POA: Diagnosis not present

## 2021-02-02 NOTE — Progress Notes (Signed)
Daily Session Note  Patient Details  Name: Chelsea Jennings MRN: [id number] Date of Birth: [birth year] Referring Provider:   April Manson Pulmonary Rehab Walk Test from 01/04/2021 in [place]  Referring Provider Dr. Georgetta Haber)      Encounter Date: 02/02/2021  Check In:  Session Check In - 02/02/21 1024      Check-In   Supervising physician immediately available to respond to emergencies [facility] immediately available    Physician(s) Dr. Tawanna Solo    Location [facility]    Staff Present Rosebud Poles, RN, Isaac Laud, MS, ACSM-CEP, Exercise Physiologist;Lisa Ysidro Evert, RN    Virtual Visit No    Medication changes reported     No    Fall or balance concerns reported    No    Tobacco Cessation No Change    Warm-up and Cool-down Performed on first and last piece of equipment    Resistance Training Performed Yes    VAD Patient? No    PAD/SET Patient? No      Pain Assessment   Currently in Pain? No/denies    Multiple Pain Sites No           Capillary Blood Glucose: No results found for this or any previous visit (from the past 24 hour(s)).    Social History   Tobacco Use  Smoking Status Not on file  Smokeless Tobacco Not on file    Goals Met:  Proper associated with RPD/PD & O2 Sat Exercise tolerated well Strength training completed today  Goals Unmet:  Not Applicable  Comments: Service time is from 1005 to 1110    Dr. Fransico Him is Medical Director for Cardiac Rehab at [facility].

## 2021-02-02 NOTE — Progress Notes (Signed)
Pulmonary Individual Treatment Plan  Patient Details  Name: Chelsea Jennings MRN: [id number] Date of Birth: 06-03-1971 Referring Provider:   April Manson Pulmonary Rehab Walk Test from 01/04/2021 in [place]  Referring Provider Dr. Georgetta Haber)      Initial Encounter Date:  Flowsheet Row Pulmonary Rehab Walk Test from 01/04/2021 in [place]  Date 01/04/21      Visit Diagnosis: Interstitial lung disease ([place])  Patient's Home Medications on Admission:   Current Outpatient Medications:  .  amLODipine (NORVASC) 5 MG tablet, Take 5 mg by mouth daily., Disp: , Rfl:  .  cetirizine (ZYRTEC) 10 MG chewable tablet, Chew 10 mg by mouth daily., Disp: , Rfl:  .  chlorpheniramine (CHLOR-TRIMETON) 4 MG tablet, Take 4 mg by mouth 2 (two) times daily as needed for allergies., Disp: , Rfl:  .  clobetasol ointment (TEMOVATE) 2.58 %, Apply 1 application topically 2 (two) times daily., Disp: , Rfl:  .  dorzolamide-timolol (COSOPT) 22.3-6.8 MG/ML ophthalmic solution, 1 drop 2 (two) times daily., Disp: , Rfl:  .  hydroxychloroquine (PLAQUENIL) 200 MG tablet, Take 200 mg by mouth 2 (two) times daily., Disp: , Rfl:  .  latanoprost (XALATAN) 0.005 % ophthalmic solution, Place 1 drop into both eyes at bedtime., Disp: , Rfl:  .  mycophenolate (CELLCEPT) 500 MG tablet, Take 500 mg by mouth 2 (two) times daily., Disp: , Rfl:  .  omeprazole (PRILOSEC) 40 MG capsule, Take 40 mg by mouth daily., Disp: , Rfl:  .  sulfamethoxazole-trimethoprim (BACTRIM) 200-40 MG/5ML suspension, Take 160 mg by mouth once. 1 tablet Monday, Wednesday, and Friday, Disp: , Rfl:   Past Medical History: Past Medical History:  Diagnosis Date  . Hypertension   . Interstitial lung disease ([place])     Tobacco Use: Social History   Tobacco Use  Smoking Status Not on file  Smokeless Tobacco Not on file    Labs: Recent Review Flowsheet Data   There is no flowsheet data to  display.     Capillary Blood Glucose: No results found for: GLUCAP   Pulmonary Assessment Scores:  Pulmonary Assessment Scores    Row Name 01/04/21 1052 01/04/21 1212       ADL UCSD   ADL Phase Entry --    SOB Score total 36 --         CAT Score   CAT Score 18 --         mMRC Score   mMRC Score -- 2          UCSD: Self-administered rating of dyspnea associated with activities of daily living (ADLs) 6-point scale (0 = "not at all" to 5 = "maximal or unable to do because of breathlessness")  Scoring Scores range from 0 to 120.  Minimally important difference is 5 units  CAT: CAT can identify the health impairment of COPD patients and is better correlated with disease progression.  CAT has a scoring range of zero to 40. The CAT score is classified into four groups of low (less than 10), medium (10 - 20), high (21-30) and very high (31-40) based on the impact level of disease on health status. A CAT score over 10 suggests significant symptoms.  A worsening CAT score could be explained by an exacerbation, poor medication adherence, poor inhaler technique, or progression of COPD or comorbid conditions.  CAT MCID is 2 points  mMRC: mMRC (Modified Medical Research Council) Dyspnea Scale is used to assess the  degree of baseline functional disability in patients of respiratory disease due to dyspnea. No minimal important difference is established. A decrease in score of 1 point or greater is considered a positive change.   Pulmonary Function Assessment:  Pulmonary Function Assessment - 01/04/21 0959      Breath   Bilateral Breath Sounds Clear    Shortness of Breath Yes;Limiting activity;Panic with Shortness of Breath;Fear of Shortness of Breath           Exercise Target Goals: Exercise Program Goal: Individual exercise prescription set using results from initial 6 min walk test and THRR while considering  patient's activity barriers and safety.   Exercise Prescription  Goal: Initial exercise prescription builds to 30-45 minutes a day of aerobic activity, 2-3 days per week.  Home exercise guidelines will be given to patient during program as part of exercise prescription that the participant will acknowledge.  Activity Barriers & Risk Stratification:  Activity Barriers & Cardiac Risk Stratification - 01/04/21 0954      Activity Barriers & Cardiac Risk Stratification   Activity Barriers Deconditioning;Muscular Weakness;Shortness of Breath           6 Minute Walk:  6 Minute Walk    Row Name 01/04/21 1051         6 Minute Walk   Phase Initial     Distance 1000 feet     Walk Time 6 minutes     # of Rest Breaks 0  Had to stop pt a couple of times due to pulse ox not picking up oxygen saturation     MPH 1.89     METS 3.32     RPE 11     Perceived Dyspnea  1     VO2 Peak 11.62     Symptoms Yes (comment)     Comments Pt has a dry cough with any type of exertion     Resting HR 88 bpm     Resting BP 116/78     Resting Oxygen Saturation  100 %     Exercise Oxygen Saturation  during 6 min walk 88 %     Max Ex. HR 101 bpm     Max Ex. BP 134/84     2 Minute Post BP 134/84           Interval HR   1 Minute HR 101     2 Minute HR 98     3 Minute HR 100     4 Minute HR 101     5 Minute HR 101     6 Minute HR 95     2 Minute Post HR 82     Interval Heart Rate? Yes           Interval Oxygen   Interval Oxygen? Yes     Baseline Oxygen Saturation % 100 %     1 Minute Oxygen Saturation % 98 %     1 Minute Liters of Oxygen 0 L     2 Minute Oxygen Saturation % 97 %     2 Minute Liters of Oxygen 0 L     3 Minute Oxygen Saturation % 97 %     3 Minute Liters of Oxygen 0 L     4 Minute Oxygen Saturation % 88 %  Pulse ox was not reading correctly. Do not think this 88% is accurate. Had pt stop for a second and oxygen saturation immediately increase to 99%.  4 Minute Liters of Oxygen 0 L     5 Minute Oxygen Saturation % 99 %     5 Minute Liters of  Oxygen 0 L     6 Minute Oxygen Saturation % 98 %     6 Minute Liters of Oxygen 0 L     2 Minute Post Oxygen Saturation % 100 %     2 Minute Post Liters of Oxygen 0 L            Oxygen Initial Assessment:  Oxygen Initial Assessment - 01/04/21 0959      Home Oxygen   Home Oxygen Device None    Sleep Oxygen Prescription None    Home Exercise Oxygen Prescription None    Home Resting Oxygen Prescription None      Initial 6 min Walk   Oxygen Used None      Program Oxygen Prescription   Program Oxygen Prescription None      Intervention   Short Term Goals To learn and understand importance of monitoring SPO2 with pulse oximeter and demonstrate accurate use of the pulse oximeter.;To learn and understand importance of maintaining oxygen saturations>88%;To learn and demonstrate proper pursed lip breathing techniques or other breathing techniques.;To learn and demonstrate proper use of respiratory medications    Long  Term Goals Verbalizes importance of monitoring SPO2 with pulse oximeter and return demonstration;Maintenance of O2 saturations>88%;Exhibits proper breathing techniques, such as pursed lip breathing or other method taught during program session;Compliance with respiratory medication;Demonstrates proper use of MDI's           Oxygen Re-Evaluation:  Oxygen Re-Evaluation    Row Name 02/02/21 0755             Program Oxygen Prescription   Program Oxygen Prescription None               Home Oxygen   Home Oxygen Device None       Sleep Oxygen Prescription None       Home Exercise Oxygen Prescription None       Home Resting Oxygen Prescription None               Goals/Expected Outcomes   Short Term Goals To learn and understand importance of monitoring SPO2 with pulse oximeter and demonstrate accurate use of the pulse oximeter.;To learn and understand importance of maintaining oxygen saturations>88%;To learn and demonstrate proper pursed lip breathing techniques or  other breathing techniques.;To learn and demonstrate proper use of respiratory medications       Long  Term Goals Verbalizes importance of monitoring SPO2 with pulse oximeter and return demonstration;Maintenance of O2 saturations>88%;Exhibits proper breathing techniques, such as pursed lip breathing or other method taught during program session;Compliance with respiratory medication;Demonstrates proper use of MDI's       Goals/Expected Outcomes compliance and understanding of oxygen saturation and pursed lip breathing.              Oxygen Discharge (Final Oxygen Re-Evaluation):  Oxygen Re-Evaluation - 02/02/21 0755      Program Oxygen Prescription   Program Oxygen Prescription None      Home Oxygen   Home Oxygen Device None    Sleep Oxygen Prescription None    Home Exercise Oxygen Prescription None    Home Resting Oxygen Prescription None      Goals/Expected Outcomes   Short Term Goals To learn and understand importance of monitoring SPO2 with pulse oximeter and demonstrate accurate use of the pulse oximeter.;To learn and understand  importance of maintaining oxygen saturations>88%;To learn and demonstrate proper pursed lip breathing techniques or other breathing techniques.;To learn and demonstrate proper use of respiratory medications    Long  Term Goals Verbalizes importance of monitoring SPO2 with pulse oximeter and return demonstration;Maintenance of O2 saturations>88%;Exhibits proper breathing techniques, such as pursed lip breathing or other method taught during program session;Compliance with respiratory medication;Demonstrates proper use of MDI's    Goals/Expected Outcomes compliance and understanding of oxygen saturation and pursed lip breathing.           Initial Exercise Prescription:  Initial Exercise Prescription - 01/04/21 1000      Date of Initial Exercise RX and Referring Provider   Date 01/04/21    Referring Provider Dr. Georgetta Haber)    Expected Discharge Date  03/11/21      Treadmill   MPH 1.8    Minutes 15      NuStep   Level 2    SPM 80    Minutes 15      Prescription Details   Frequency (times per week) 2    Duration Progress to 30 minutes of continuous aerobic without signs/symptoms of physical distress      Intensity   THRR 40-80% of Max Heartrate 68-137    Ratings of Perceived Exertion 11-13    Perceived Dyspnea 0-4      Progression   Progression Continue to progress workloads to maintain intensity without signs/symptoms of physical distress.      Resistance Training   Training Prescription Yes    Weight blue bands    Reps 10-15           Perform Capillary Blood Glucose checks as needed.  Exercise Prescription Changes:  Exercise Prescription Changes    Row Name 01/12/21 1100 01/26/21 1100           Response to Exercise   Blood Pressure (Admit) 112/66 110/64      Blood Pressure (Exercise) 144/86 118/76      Blood Pressure (Exit) 100/60 114/66      Heart Rate (Admit) 99 bpm 90 bpm      Heart Rate (Exercise) 121 bpm 107 bpm      Heart Rate (Exit) 82 bpm 87 bpm      Oxygen Saturation (Admit) 99 % 100 %      Oxygen Saturation (Exercise) 97 % 97 %      Oxygen Saturation (Exit) 100 % 100 %      Rating of Perceived Exertion (Exercise) 11 10      Perceived Dyspnea (Exercise) 1 2      Duration Continue with 30 min of aerobic exercise without signs/symptoms of physical distress. Progress to 30 minutes of  aerobic without signs/symptoms of physical distress      Intensity Other (comment)  40%-80% HR Max THRR unchanged             Progression   Progression Continue to progress workloads to maintain intensity without signs/symptoms of physical distress. Continue to progress workloads to maintain intensity without signs/symptoms of physical distress.             Resistance Training   Training Prescription Yes Yes      Weight blue bands blue bands      Reps 10-15 10-15      Time 10 Minutes 10 Minutes              Treadmill   MPH 1.8 2.2      Grade 0 0  Minutes 15 15             NuStep   Level 2 3      SPM 80 80      Minutes 15 15      METs 2.3 2.5             Exercise Comments:  Exercise Comments    Row Name 01/12/21 1152           Exercise Comments Patient completed first day of exercise and tolerated well with no concerns. She did have mild shortness of breath throughout her exercise session. She was able to complete 30 minutes of cardiovascular exercise with no rest breaks and completed resistance training with no mobility isssues.              Exercise Goals and Review:  Exercise Goals    Row Name 01/04/21 1050             Exercise Goals   Increase Physical Activity Yes       Intervention Provide advice, education, support and counseling about physical activity/exercise needs.;Develop an individualized exercise prescription for aerobic and resistive training based on initial evaluation findings, risk stratification, comorbidities and participant's personal goals.       Expected Outcomes Short Term: Attend rehab on a regular basis to increase amount of physical activity.;Long Term: Add in home exercise to make exercise part of routine and to increase amount of physical activity.;Long Term: Exercising regularly at least 3-5 days a week.       Increase Strength and Stamina Yes       Intervention Provide advice, education, support and counseling about physical activity/exercise needs.;Develop an individualized exercise prescription for aerobic and resistive training based on initial evaluation findings, risk stratification, comorbidities and participant's personal goals.       Expected Outcomes Short Term: Increase workloads from initial exercise prescription for resistance, speed, and METs.;Short Term: Perform resistance training exercises routinely during rehab and add in resistance training at home;Long Term: Improve cardiorespiratory fitness, muscular endurance and strength  as measured by increased METs and functional capacity (6MWT)       Able to understand and use rate of perceived exertion (RPE) scale Yes       Intervention Provide education and explanation on how to use RPE scale       Expected Outcomes Short Term: Able to use RPE daily in rehab to express subjective intensity level;Long Term:  Able to use RPE to guide intensity level when exercising independently       Able to understand and use Dyspnea scale Yes       Intervention Provide education and explanation on how to use Dyspnea scale       Expected Outcomes Short Term: Able to use Dyspnea scale daily in rehab to express subjective sense of shortness of breath during exertion;Long Term: Able to use Dyspnea scale to guide intensity level when exercising independently       Knowledge and understanding of Target Heart Rate Range (THRR) Yes       Intervention Provide education and explanation of THRR including how the numbers were predicted and where they are located for reference       Expected Outcomes Short Term: Able to state/look up THRR;Long Term: Able to use THRR to govern intensity when exercising independently;Short Term: Able to use daily as guideline for intensity in rehab       Understanding of Exercise Prescription Yes  Intervention Provide education, explanation, and written materials on patient's individual exercise prescription       Expected Outcomes Short Term: Able to explain program exercise prescription;Long Term: Able to explain home exercise prescription to exercise independently              Exercise Goals Re-Evaluation :  Exercise Goals Re-Evaluation    Row Name 02/02/21 0751             Exercise Goal Re-Evaluation   Exercise Goals Review Increase Physical Activity;Increase Strength and Stamina;Able to understand and use rate of perceived exertion (RPE) scale;Able to understand and use Dyspnea scale;Knowledge and understanding of Target Heart Rate Range  (THRR);Understanding of Exercise Prescription       Comments Pt has completed 6 exercise sessions and has tolerated very well so far. She is a very high functioning pt but does experience mild shortness of breath with exercise. She has made steady progressions with workload and MET increases. She is currently exercising at 2.6 METS on level 3 on the Nustep and 4.5 METS on the treadmill. Will continue to monitor and progress as she is able. Will also be discussing home exercise prescription in the next couple of weeks.       Expected Outcomes Through exercise at rehab and home, the patient will decrease shortness of breath with daily activities and feel confident in carrying out an exercise regimn at home.              Discharge Exercise Prescription (Final Exercise Prescription Changes):  Exercise Prescription Changes - 01/26/21 1100      Response to Exercise   Blood Pressure (Admit) 110/64    Blood Pressure (Exercise) 118/76    Blood Pressure (Exit) 114/66    Heart Rate (Admit) 90 bpm    Heart Rate (Exercise) 107 bpm    Heart Rate (Exit) 87 bpm    Oxygen Saturation (Admit) 100 %    Oxygen Saturation (Exercise) 97 %    Oxygen Saturation (Exit) 100 %    Rating of Perceived Exertion (Exercise) 10    Perceived Dyspnea (Exercise) 2    Duration Progress to 30 minutes of  aerobic without signs/symptoms of physical distress    Intensity THRR unchanged      Progression   Progression Continue to progress workloads to maintain intensity without signs/symptoms of physical distress.      Resistance Training   Training Prescription Yes    Weight blue bands    Reps 10-15    Time 10 Minutes      Treadmill   MPH 2.2    Grade 0    Minutes 15      NuStep   Level 3    SPM 80    Minutes 15    METs 2.5           Nutrition:  Target Goals: Understanding of nutrition guidelines, daily intake of sodium <1535m, cholesterol <2016m calories 30% from fat and 7% or less from saturated fats,  daily to have 5 or more servings of fruits and vegetables.  Biometrics:  Pre Biometrics - 01/04/21 0955      Pre Biometrics   Height 5' 7"  (1.702 m)    Weight 91.4 kg    BMI (Calculated) 31.55    Grip Strength 24 kg            Nutrition Therapy Plan and Nutrition Goals:  Nutrition Therapy & Goals - 01/14/21 1131      Nutrition Therapy  Diet TLC    Drug/Food Interactions Statins/Certain Fruits      Personal Nutrition Goals   Nutrition Goal Pt to identify food quantities necessary to achieve weight loss of 6-24 lb at graduation from cardiac rehab.    Personal Goal #2 Pt to build a healthy plate including vegetables, fruits, whole grains, and low-fat dairy products in a heart healthy meal plan.      Intervention Plan   Intervention Prescribe, educate and counsel regarding individualized specific dietary modifications aiming towards targeted core components such as weight, hypertension, lipid management, diabetes, heart failure and other comorbidities.;Nutrition handout(s) given to patient.    Expected Outcomes Short Term Goal: Understand basic principles of dietary content, such as calories, fat, sodium, cholesterol and nutrients.           Nutrition Assessments:  MEDIFICTS Score Key:  ?70 Need to make dietary changes   40-70 Heart Healthy Diet  ? 40 Therapeutic Level Cholesterol Diet  Flowsheet Row PULMONARY REHAB OTHER RESPIRATORY from 01/26/2021 in [facility]  Picture Your Plate Total Score on Admission 62     Picture Your Plate Scores:  <45 Unhealthy dietary pattern with much room for improvement.  41-50 Dietary pattern unlikely to meet recommendations for good health and room for improvement.  51-60 More healthful dietary pattern, with some room for improvement.   >60 Healthy dietary pattern, although there may be some specific behaviors that could be improved.    Nutrition Goals Re-Evaluation:  Nutrition Goals  Re-Evaluation    [place] Name 01/14/21 1131 01/27/21 1451           Goals   Current Weight 203 lb (92.1 kg) 201 lb 8 oz (91.4 kg)      Nutrition Goal -- Pt to identify food quantities necessary to achieve weight loss of 6-24 lb at graduation from cardiac rehab.      Comment -- 2 lb weight loss this week      Expected Outcome Weight loss Weight loss             Personal Goal #2 Re-Evaluation   Personal Goal #2 -- Pt to build a healthy plate including vegetables, fruits, whole grains, and low-fat dairy products in a heart healthy meal plan.             Nutrition Goals Discharge (Final Nutrition Goals Re-Evaluation):  Nutrition Goals Re-Evaluation - 01/27/21 1451      Goals   Current Weight 201 lb 8 oz (91.4 kg)    Nutrition Goal Pt to identify food quantities necessary to achieve weight loss of 6-24 lb at graduation from cardiac rehab.    Comment 2 lb weight loss this week    Expected Outcome Weight loss      Personal Goal #2 Re-Evaluation   Personal Goal #2 Pt to build a healthy plate including vegetables, fruits, whole grains, and low-fat dairy products in a heart healthy meal plan.           Psychosocial: Target Goals: Acknowledge presence or absence of significant depression and/or stress, maximize coping skills, provide positive support system. Participant is able to verbalize types and ability to use techniques and skills needed for reducing stress and depression.  Initial Review & Psychosocial Screening:  Initial Psych Review & Screening - 01/04/21 1003      Initial Review   Current issues with None Identified      Family Dynamics   Good Support System? Yes   friends and family  Barriers   Psychosocial barriers to participate in program There are no identifiable barriers or psychosocial needs.      Screening Interventions   Interventions Encouraged to exercise    Expected Outcomes Long Term goal: The participant improves quality of Life and PHQ9 Scores as seen  by post scores and/or verbalization of changes           Quality of Life Scores:  Scores of 19 and below usually indicate a poorer quality of life in these areas.  A difference of  2-3 points is a clinically meaningful difference.  A difference of 2-3 points in the total score of the Quality of Life Index has been associated with significant improvement in overall quality of life, self-image, physical symptoms, and general health in studies assessing change in quality of life.  PHQ-9: Recent Review Flowsheet Data    Depression screen [facility] 2/9 01/04/2021   Decreased Interest 0   Down, Depressed, Hopeless 0   PHQ - 2 Score 0   Altered sleeping 0   Tired, decreased energy 0   Change in appetite 0   Feeling bad or failure about yourself  0   Trouble concentrating 0   Moving slowly or fidgety/restless 0   Suicidal thoughts 0   Difficult doing work/chores Not difficult at all     Interpretation of Total Score  Total Score Depression Severity:  1-4 = Minimal depression, 5-9 = Mild depression, 10-14 = Moderate depression, 15-19 = Moderately severe depression, 20-27 = Severe depression   Psychosocial Evaluation and Intervention:  Psychosocial Evaluation - 01/04/21 1005      Psychosocial Evaluation & Interventions   Interventions Encouraged to exercise with the program and follow exercise prescription    Continue Psychosocial Services  No Follow up required           Psychosocial Re-Evaluation:  Psychosocial Re-Evaluation    [place] Name 02/01/21 1129             Psychosocial Re-Evaluation   Current issues with None Identified       Comments Shifra has a positive attitude and has no psychosocial concerns at this time, has a supportive family.       Interventions Encouraged to attend Pulmonary Rehabilitation for the exercise       Continue Psychosocial Services  No Follow up required              Psychosocial Discharge (Final Psychosocial Re-Evaluation):  Psychosocial  Re-Evaluation - 02/01/21 1129      Psychosocial Re-Evaluation   Current issues with None Identified    Comments Saysha has a positive attitude and has no psychosocial concerns at this time, has a supportive family.    Interventions Encouraged to attend Pulmonary Rehabilitation for the exercise    Continue Psychosocial Services  No Follow up required           Education: Education Goals: Education classes will be provided on a weekly basis, covering required topics. Participant will state understanding/return demonstration of topics presented.  Learning Barriers/Preferences:  Learning Barriers/Preferences - 01/04/21 1005      Learning Barriers/Preferences   Learning Barriers None    Learning Preferences Written Material;Verbal Instruction;Individual Instruction           Education Topics: Risk Factor Reduction:  -Group instruction that is supported by a PowerPoint presentation. Instructor discusses the definition of a risk factor, different risk factors for pulmonary disease, and how the heart and lungs work together.     Nutrition for  Pulmonary Patient:  -Group instruction provided by PowerPoint slides, verbal discussion, and written materials to support subject matter. The instructor gives an explanation and review of healthy diet recommendations, which includes a discussion on weight management, recommendations for fruit and vegetable consumption, as well as protein, fluid, caffeine, fiber, sodium, sugar, and alcohol. Tips for eating when patients are short of breath are discussed.   Pursed Lip Breathing:  -Group instruction that is supported by demonstration and informational handouts. Instructor discusses the benefits of pursed lip and diaphragmatic breathing and detailed demonstration on how to preform both.     Oxygen Safety:  -Group instruction provided by PowerPoint, verbal discussion, and written material to support subject matter. There is an overview of "What is  Oxygen" and "Why do we need it".  Instructor also reviews how to create a safe environment for oxygen use, the importance of using oxygen as prescribed, and the risks of noncompliance. There is a brief discussion on traveling with oxygen and resources the patient may utilize.   Oxygen Equipment:  -Group instruction provided by [facility] Staff utilizing handouts, written materials, and equipment demonstrations.   Signs and Symptoms:  -Group instruction provided by written material and verbal discussion to support subject matter. Warning signs and symptoms of infection, stroke, and heart attack are reviewed and when to call the physician/911 reinforced. Tips for preventing the spread of infection discussed.   Advanced Directives:  -Group instruction provided by verbal instruction and written material to support subject matter. Instructor reviews Advanced Directive laws and proper instruction for filling out document.   Pulmonary Video:  -Group video education that reviews the importance of medication and oxygen compliance, exercise, good nutrition, pulmonary hygiene, and pursed lip and diaphragmatic breathing for the pulmonary patient.   Exercise for the Pulmonary Patient:  -Group instruction that is supported by a PowerPoint presentation. Instructor discusses benefits of exercise, core components of exercise, frequency, duration, and intensity of an exercise routine, importance of utilizing pulse oximetry during exercise, safety while exercising, and options of places to exercise outside of rehab.     Pulmonary Medications:  -Verbally interactive group education provided by instructor with focus on inhaled medications and proper administration.   Anatomy and Physiology of the Respiratory System and Intimacy:  -Group instruction provided by PowerPoint, verbal discussion, and written material to support subject matter. Instructor reviews respiratory cycle and anatomical components of the  respiratory system and their functions. Instructor also reviews differences in obstructive and restrictive respiratory diseases with examples of each. Intimacy, Sex, and Sexuality differences are reviewed with a discussion on how relationships can change when diagnosed with pulmonary disease. Common sexual concerns are reviewed. Flowsheet Row PULMONARY REHAB OTHER RESPIRATORY from 01/14/2021 in [place]  Date 01/14/21  Educator Bufford Lope  Instruction Review Code 1- Verbalizes Understanding      MD DAY -A group question and answer session with a medical doctor that allows participants to ask questions that relate to their pulmonary disease state.   OTHER EDUCATION -Group or individual verbal, written, or video instructions that support the educational goals of the pulmonary rehab program.   Holiday Eating Survival Tips:  -Group instruction provided by PowerPoint slides, verbal discussion, and written materials to support subject matter. The instructor gives patients tips, tricks, and techniques to help them not only survive but enjoy the holidays despite the onslaught of food that accompanies the holidays.   Knowledge Questionnaire Score:  Knowledge Questionnaire Score - 01/04/21 1051  Knowledge Questionnaire Score   Pre Score 15/18           Core Components/Risk Factors/Patient Goals at Admission:  Personal Goals and Risk Factors at Admission - 01/04/21 1005      Core Components/Risk Factors/Patient Goals on Admission   Improve shortness of breath with ADL's Yes           Core Components/Risk Factors/Patient Goals Review:   Goals and Risk Factor Review    Row Name 01/04/21 1006 02/01/21 1130           Core Components/Risk Factors/Patient Goals Review   Personal Goals Review Develop more efficient breathing techniques such as purse lipped breathing and diaphragmatic breathing and practicing self-pacing with activity.;Increase  knowledge of respiratory medications and ability to use respiratory devices properly.;Improve shortness of breath with ADL's Develop more efficient breathing techniques such as purse lipped breathing and diaphragmatic breathing and practicing self-pacing with activity.;Increase knowledge of respiratory medications and ability to use respiratory devices properly.;Improve shortness of breath with ADL's      Review -- Wynder has attended 6 exercise sessions, is progressing well, seems to enjoy exercising and pushing herself to new limits, is exercising @ 2.6 mets on the nustep and 2.2 mph on the treadmill.      Expected Outcomes -- See admission goals.             Core Components/Risk Factors/Patient Goals at Discharge (Final Review):   Goals and Risk Factor Review - 02/01/21 1130      Core Components/Risk Factors/Patient Goals Review   Personal Goals Review Develop more efficient breathing techniques such as purse lipped breathing and diaphragmatic breathing and practicing self-pacing with activity.;Increase knowledge of respiratory medications and ability to use respiratory devices properly.;Improve shortness of breath with ADL's    Review Vassey has attended 6 exercise sessions, is progressing well, seems to enjoy exercising and pushing herself to new limits, is exercising @ 2.6 mets on the nustep and 2.2 mph on the treadmill.    Expected Outcomes See admission goals.           ITP Comments:   Comments: ITP REVIEW Pt is making expected progress toward pulmonary rehab goals after completing 8 sessions. Recommend continued exercise, life style modification, education, and utilization of breathing techniques to increase stamina and strength and decrease shortness of breath with exertion.

## 2021-02-04 ENCOUNTER — Other Ambulatory Visit: Payer: Self-pay

## 2021-02-04 ENCOUNTER — Encounter (HOSPITAL_COMMUNITY)
Admission: RE | Admit: 2021-02-04 | Discharge: 2021-02-04 | Disposition: A | Discharge status: Home / Self Care | Payer: No Typology Code available for payment source | Source: Ambulatory Visit | Attending: Cardiology | Admitting: Cardiology

## 2021-02-04 ENCOUNTER — Ambulatory Visit (HOSPITAL_COMMUNITY): Payer: Non-veteran care

## 2021-02-04 ENCOUNTER — Encounter (HOSPITAL_COMMUNITY): Payer: No Typology Code available for payment source

## 2021-02-04 DIAGNOSIS — J849 Interstitial pulmonary disease, unspecified: Secondary | ICD-10-CM

## 2021-02-04 NOTE — Progress Notes (Signed)
Daily Session Note  Patient Details  Name: Chelsea Jennings MRN: [id number] Date of Birth: [birth year] Referring Provider:   April Manson Pulmonary Rehab Walk Test from 01/04/2021 in [place]  Referring Provider Dr. Georgetta Haber)      Encounter Date: 02/04/2021  Check In:  Session Check In - 02/04/21 1051      Check-In   Supervising physician immediately available to respond to emergencies [facility] immediately available    Physician(s) Dr. Florencia Reasons    Location [facility]    Staff Present Rosebud Poles, RN, BSN;Lisa Ysidro Evert, RN;[place] Zou Hassell Done, MS, ACSM-CEP, Exercise Physiologist    Virtual Visit No    Medication changes reported     No    Fall or balance concerns reported    No    Tobacco Cessation No Change    Warm-up and Cool-down Performed on first and last piece of equipment    Resistance Training Performed No    VAD Patient? No    PAD/SET Patient? No      Pain Assessment   Currently in Pain? No/denies    Multiple Pain Sites No           Capillary Blood Glucose: No results found for this or any previous visit (from the past 24 hour(s)).    Social History   Tobacco Use  Smoking Status Not on file  Smokeless Tobacco Not on file    Goals Met:  Proper associated with RPD/PD & O2 Sat Independence with exercise equipment Exercise tolerated well No report of cardiac concerns or symptoms Strength training completed today  Goals Unmet:  Not Applicable  Comments: Service time is from 1005 to 1110    Dr. Fransico Him is Medical Director for Cardiac Rehab at [facility].

## 2021-02-09 ENCOUNTER — Ambulatory Visit (HOSPITAL_COMMUNITY): Payer: Non-veteran care

## 2021-02-09 ENCOUNTER — Encounter (HOSPITAL_COMMUNITY): Payer: No Typology Code available for payment source

## 2021-02-09 ENCOUNTER — Encounter (HOSPITAL_COMMUNITY)
Admission: RE | Admit: 2021-02-09 | Discharge: 2021-02-09 | Disposition: A | Discharge status: Home / Self Care | Payer: No Typology Code available for payment source | Source: Ambulatory Visit | Attending: Cardiology | Admitting: Cardiology

## 2021-02-09 ENCOUNTER — Other Ambulatory Visit: Payer: Self-pay

## 2021-02-09 VITALS — Wt 203.7 lb

## 2021-02-09 DIAGNOSIS — J849 Interstitial pulmonary disease, unspecified: Secondary | ICD-10-CM | POA: Diagnosis present

## 2021-02-09 NOTE — Progress Notes (Signed)
Daily Session Note  Patient Details  Name: Chelsea Jennings MRN: [id number] Date of Birth: [birth year] Referring Provider:   April Manson Pulmonary Rehab Walk Test from 01/04/2021 in [place]  Referring Provider Dr. Georgetta Haber)      Encounter Date: 02/09/2021  Check In:  Session Check In - 02/09/21 1116      Check-In   Supervising physician immediately available to respond to emergencies [facility] immediately available    Physician(s) Dr. Tawanna Solo    [facility]    Staff Present Maurice Small, RN, BSN;Lisa Ysidro Evert, RN;Lendora Keys Hassell Done, MS, ACSM-CEP, Exercise Physiologist    Virtual Visit No    Medication changes reported     No    Fall or balance concerns reported    No    Tobacco Cessation No Change    Warm-up and Cool-down Performed on first and last piece of equipment    Resistance Training Performed Yes    VAD Patient? No    PAD/SET Patient? No      Pain Assessment   Currently in Pain? No/denies    Multiple Pain Sites No           Capillary Blood Glucose: No results found for this or any previous visit (from the past 24 hour(s)).   Exercise Prescription Changes - 02/09/21 1200      Response to Exercise   Blood Pressure (Admit) 114/72    Blood Pressure (Exercise) 164/80    Blood Pressure (Exit) 107/72    Heart Rate (Admit) 83 bpm    Heart Rate (Exercise) 125 bpm    Heart Rate (Exit) 95 bpm    Oxygen Saturation (Admit) 100 %    Oxygen Saturation (Exercise) 95 %    Oxygen Saturation (Exit) 99 %    Rating of Perceived Exertion (Exercise) 11    Perceived Dyspnea (Exercise) 1    Duration Progress to 45 minutes of aerobic exercise without signs/symptoms of physical distress    Intensity THRR unchanged      Progression   Progression Continue to progress workloads to maintain intensity without signs/symptoms of physical distress.      Resistance Training   Training Prescription Yes    Weight blue  bands    Reps 10-15    Time 10 Minutes      Treadmill   MPH 2.4    Grade 1    Minutes 15      NuStep   Level 4    SPM 80    Minutes 15    METs 2.8           Social History   Tobacco Use  Smoking Status Not on file  Smokeless Tobacco Not on file    Goals Met:  Proper associated with RPD/PD & O2 Sat Independence with exercise equipment Exercise tolerated well Strength training completed today  Goals Unmet:  Not Applicable  Comments: Service time is from 1005 to 44    Dr. Fransico Him is Medical Director for Cardiac Rehab at [facility].

## 2021-02-11 ENCOUNTER — Encounter (HOSPITAL_COMMUNITY): Payer: No Typology Code available for payment source

## 2021-02-11 ENCOUNTER — Ambulatory Visit (HOSPITAL_COMMUNITY): Payer: Non-veteran care

## 2021-02-16 ENCOUNTER — Encounter (HOSPITAL_COMMUNITY): Payer: No Typology Code available for payment source

## 2021-02-16 ENCOUNTER — Telehealth (HOSPITAL_COMMUNITY): Payer: Self-pay

## 2021-02-16 NOTE — Telephone Encounter (Signed)
Pt called stating she will be out of pulmonary rehab today due to having a runny nose and sore throat. Pt took an at home COVID test which was negative. Will cancel pt's appointment for today.

## 2021-02-18 ENCOUNTER — Encounter (HOSPITAL_COMMUNITY): Payer: No Typology Code available for payment source

## 2021-02-23 ENCOUNTER — Encounter (HOSPITAL_COMMUNITY): Payer: No Typology Code available for payment source

## 2021-02-23 ENCOUNTER — Other Ambulatory Visit: Payer: Self-pay

## 2021-02-23 ENCOUNTER — Encounter (HOSPITAL_COMMUNITY)
Admission: RE | Admit: 2021-02-23 | Discharge: 2021-02-23 | Disposition: A | Discharge status: Home / Self Care | Payer: No Typology Code available for payment source | Source: Ambulatory Visit | Attending: Cardiology | Admitting: Cardiology

## 2021-02-23 VITALS — Wt 202.2 lb

## 2021-02-23 DIAGNOSIS — J849 Interstitial pulmonary disease, unspecified: Secondary | ICD-10-CM

## 2021-02-23 NOTE — Progress Notes (Signed)
I have reviewed a Home Exercise Prescription with [name] . Chelsea Jennings is currently exercising at home. She states she does some form of resistance training nearly every day for 10-15 minutes. She also just bought an outdoor bike and plans to ride that for exercise.The patient was advised to walk, bike and perform resistance training 3-4 additional days a week for 45-60 minutes.  [name] and I discussed how to progress their exercise prescription. The patient stated that their goals were to lose weight and to be able to walk a mile without having to take so many rest breaks. The patient stated that they understand the exercise prescription.  We reviewed exercise guidelines, target heart rate during exercise, RPE Scale, weather conditions, inhaler use, endpoints for exercise, warmup and cool down.  Patient is encouraged to come to me with any questions. I will continue to follow up with the patient to assist them with progression and safety.    Norris Cross MS, ACSM CEP 4:07 PM 02/23/2021

## 2021-02-23 NOTE — Progress Notes (Signed)
Daily Session Note  Patient Details  Name: Chelsea Jennings MRN: [id number] Date of Birth: 11-Jun-1971 Referring Provider:   April Manson Pulmonary Rehab Walk Test from 01/04/2021 in [place]  Referring Provider Dr. Georgetta Haber)      Encounter Date: 02/23/2021  Check In:  Session Check In - 02/23/21 1010      Check-In   Supervising physician immediately available to respond to emergencies [facility] immediately available    Physician(s) Dr. Tawanna Solo    [facility]    Staff Present Rosebud Poles, RN, BSN;Chelsy Parrales Ysidro Evert, RN;Jessica Hassell Done, MS, ACSM-CEP, Exercise Physiologist    Virtual Visit No    Medication changes reported     No    Fall or balance concerns reported    No    Tobacco Cessation No Change    Warm-up and Cool-down Performed on first and last piece of equipment    Resistance Training Performed Yes    VAD Patient? No    PAD/SET Patient? No      Pain Assessment   Currently in Pain? No/denies    Multiple Pain Sites No           Capillary Blood Glucose: No results found for this or any previous visit (from the past 24 hour(s)).   Exercise Prescription Changes - 02/23/21 1300      Response to Exercise   Blood Pressure (Admit) 104/60    Blood Pressure (Exercise) 130/80    Blood Pressure (Exit) 106/70    Heart Rate (Admit) 99 bpm    Heart Rate (Exercise) 128 bpm    Heart Rate (Exit) 105 bpm    Oxygen Saturation (Admit) 99 %    Oxygen Saturation (Exercise) 96 %    Oxygen Saturation (Exit) 100 %    Rating of Perceived Exertion (Exercise) 10    Perceived Dyspnea (Exercise) 1    Duration Progress to 30 minutes of  aerobic without signs/symptoms of physical distress    Intensity THRR unchanged      Progression   Progression Continue to progress workloads to maintain intensity without signs/symptoms of physical distress.      Resistance Training   Training Prescription Yes    Weight blue bands     Reps 10-15    Time 10 Minutes      Treadmill   MPH 2.6    Grade 0.5    Minutes 15      NuStep   Level 3    SPM 80    Minutes 15    METs 2.6           Social History   Tobacco Use  Smoking Status Not on file  Smokeless Tobacco Not on file    Goals Met:  Exercise tolerated well No report of cardiac concerns or symptoms Strength training completed today  Goals Unmet:  Not Applicable  Comments: Service time is from 0955 to 1102    Dr. Fransico Him is Medical Director for Cardiac Rehab at [facility].

## 2021-02-25 ENCOUNTER — Other Ambulatory Visit: Payer: Self-pay

## 2021-02-25 ENCOUNTER — Encounter (HOSPITAL_COMMUNITY): Payer: No Typology Code available for payment source

## 2021-02-25 ENCOUNTER — Encounter (HOSPITAL_COMMUNITY)
Admission: RE | Admit: 2021-02-25 | Discharge: 2021-02-25 | Disposition: A | Discharge status: Home / Self Care | Payer: No Typology Code available for payment source | Source: Ambulatory Visit | Attending: Cardiology | Admitting: Cardiology

## 2021-02-25 DIAGNOSIS — J849 Interstitial pulmonary disease, unspecified: Secondary | ICD-10-CM | POA: Diagnosis not present

## 2021-02-25 NOTE — Progress Notes (Signed)
Daily Session Note  Patient Details  Name: Chelsea Jennings MRN: [id number] Date of Birth: 1971/06/05 Referring Provider:   April Manson Pulmonary Rehab Walk Test from 01/04/2021 in [place]  Referring Provider Dr. Georgetta Haber)      Encounter Date: 02/25/2021  Check In:  Session Check In - 02/25/21 1022      Check-In   Supervising physician immediately available to respond to emergencies [facility] immediately available    Physician(s) Dr. Tawanna Solo    [facility]    Staff Present Rosebud Poles, RN, BSN;Lisa Ysidro Evert, RN;Jessica Hassell Done, MS, ACSM-CEP, Exercise Physiologist    Virtual Visit No    Medication changes reported     No    Fall or balance concerns reported    No    Tobacco Cessation No Change    Warm-up and Cool-down Performed on first and last piece of equipment    Resistance Training Performed Yes    VAD Patient? No    PAD/SET Patient? No      Pain Assessment   Currently in Pain? No/denies    Multiple Pain Sites No           Capillary Blood Glucose: No results found for this or any previous visit (from the past 24 hour(s)).    Social History   Tobacco Use  Smoking Status Not on file  Smokeless Tobacco Not on file    Goals Met:  Proper associated with RPD/PD & O2 Sat Exercise tolerated well Strength training completed today  Goals Unmet:  Not Applicable  Comments: Service time is from 1000 to [place]    Dr. Fransico Him is Medical Director for Cardiac Rehab at [facility].

## 2021-03-02 ENCOUNTER — Encounter (HOSPITAL_COMMUNITY)
Admission: RE | Admit: 2021-03-02 | Discharge: 2021-03-02 | Disposition: A | Discharge status: Home / Self Care | Payer: No Typology Code available for payment source | Source: Ambulatory Visit | Attending: Cardiology | Admitting: Cardiology

## 2021-03-02 ENCOUNTER — Encounter (HOSPITAL_COMMUNITY): Payer: No Typology Code available for payment source

## 2021-03-02 ENCOUNTER — Other Ambulatory Visit: Payer: Self-pay

## 2021-03-02 DIAGNOSIS — J849 Interstitial pulmonary disease, unspecified: Secondary | ICD-10-CM

## 2021-03-02 NOTE — Progress Notes (Signed)
Daily Session Note  Patient Details  Name: Chelsea Jennings MRN: [id number] Date of Birth: 06/24/1971 Referring Provider:   April Manson Pulmonary Rehab Walk Test from 01/04/2021 in [place]  Referring Provider Dr. Georgetta Haber)      Encounter Date: 03/02/2021  Check In:  Session Check In - 03/02/21 1125      Check-In   Supervising physician immediately available to respond to emergencies [facility] immediately available    Physician(s) Dr. Tawanna Solo    [facility]    Staff Present Rosebud Poles, RN, BSN;Lisa Ysidro Evert, RN;Caitlyn Buchanan Hassell Done, MS, ACSM-CEP, Exercise Physiologist    Virtual Visit No    Medication changes reported     No    Fall or balance concerns reported    No    Tobacco Cessation No Change    Warm-up and Cool-down Performed on first and last piece of equipment    Resistance Training Performed Yes    VAD Patient? No    PAD/SET Patient? No      Pain Assessment   Currently in Pain? No/denies    Multiple Pain Sites No           Capillary Blood Glucose: No results found for this or any previous visit (from the past 24 hour(s)).    Social History   Tobacco Use  Smoking Status Not on file  Smokeless Tobacco Not on file    Goals Met:  Proper associated with RPD/PD & O2 Sat Independence with exercise equipment Exercise tolerated well No report of cardiac concerns or symptoms Strength training completed today  Goals Unmet:  Not Applicable  Comments: Service time is from 1000 to 1105    Dr. Fransico Him is Medical Director for Cardiac Rehab at [facility].

## 2021-03-02 NOTE — Progress Notes (Signed)
Pulmonary Individual Treatment Plan  Patient Details  Name: Amma Crear MRN: [id number] Date of Birth: 05/07/1971 Referring Provider:   April Manson Pulmonary Rehab Walk Test from 01/04/2021 in [facility]  Referring Provider Dr. Georgetta Haber)      Initial Encounter Date:  Flowsheet Row Pulmonary Rehab Walk Test from 01/04/2021 in [facility]  Date 01/04/21      Visit Diagnosis: Interstitial lung disease ([place])  Patient's Home Medications on Admission:   Current Outpatient Medications:  .  amLODipine (NORVASC) 5 MG tablet, Take 5 mg by mouth daily., Disp: , Rfl:  .  cetirizine (ZYRTEC) 10 MG chewable tablet, Chew 10 mg by mouth daily., Disp: , Rfl:  .  chlorpheniramine (CHLOR-TRIMETON) 4 MG tablet, Take 4 mg by mouth 2 (two) times daily as needed for allergies., Disp: , Rfl:  .  clobetasol ointment (TEMOVATE) 9.38 %, Apply 1 application topically 2 (two) times daily., Disp: , Rfl:  .  dorzolamide-timolol (COSOPT) 22.3-6.8 MG/ML ophthalmic solution, 1 drop 2 (two) times daily., Disp: , Rfl:  .  hydroxychloroquine (PLAQUENIL) 200 MG tablet, Take 200 mg by mouth 2 (two) times daily., Disp: , Rfl:  .  latanoprost (XALATAN) 0.005 % ophthalmic solution, Place 1 drop into both eyes at bedtime., Disp: , Rfl:  .  mycophenolate (CELLCEPT) 500 MG tablet, Take 500 mg by mouth 2 (two) times daily., Disp: , Rfl:  .  omeprazole (PRILOSEC) 40 MG capsule, Take 40 mg by mouth daily., Disp: , Rfl:  .  sulfamethoxazole-trimethoprim (BACTRIM) 200-40 MG/5ML suspension, Take 160 mg by mouth once. 1 tablet Monday, Wednesday, and Friday, Disp: , Rfl:   Past Medical History: Past Medical History:  Diagnosis Date  . Hypertension   . Interstitial lung disease ([place])     Tobacco Use: Social History   Tobacco Use  Smoking Status Not on file  Smokeless Tobacco Not on file    Labs: Recent Review Flowsheet Data   There is no flowsheet data to  display.     Capillary Blood Glucose: No results found for: GLUCAP   Pulmonary Assessment Scores:  Pulmonary Assessment Scores    Row Name 01/04/21 1052 01/04/21 1212       ADL UCSD   ADL Phase Entry --    SOB Score total 36 --         CAT Score   CAT Score 18 --         mMRC Score   mMRC Score -- 2          UCSD: Self-administered rating of dyspnea associated with activities of daily living (ADLs) 6-point scale (0 = "not at all" to 5 = "maximal or unable to do because of breathlessness")  Scoring Scores range from 0 to 120.  Minimally important difference is 5 units  CAT: CAT can identify the health impairment of COPD patients and is better correlated with disease progression.  CAT has a scoring range of zero to 40. The CAT score is classified into four groups of low (less than 10), medium (10 - 20), high (21-30) and very high (31-40) based on the impact level of disease on health status. A CAT score over 10 suggests significant symptoms.  A worsening CAT score could be explained by an exacerbation, poor medication adherence, poor inhaler technique, or progression of COPD or comorbid conditions.  CAT MCID is 2 points  mMRC: mMRC (Modified Medical Research Council) Dyspnea Scale is used to assess the  degree of baseline functional disability in patients of respiratory disease due to dyspnea. No minimal important difference is established. A decrease in score of 1 point or greater is considered a positive change.   Pulmonary Function Assessment:  Pulmonary Function Assessment - 01/04/21 0959      Breath   Bilateral Breath Sounds Clear    Shortness of Breath Yes;Limiting activity;Panic with Shortness of Breath;Fear of Shortness of Breath           Exercise Target Goals: Exercise Program Goal: Individual exercise prescription set using results from initial 6 min walk test and THRR while considering  patient's activity barriers and safety.   Exercise Prescription  Goal: Initial exercise prescription builds to 30-45 minutes a day of aerobic activity, 2-3 days per week.  Home exercise guidelines will be given to patient during program as part of exercise prescription that the participant will acknowledge.  Activity Barriers & Risk Stratification:  Activity Barriers & Cardiac Risk Stratification - 01/04/21 0954      Activity Barriers & Cardiac Risk Stratification   Activity Barriers Deconditioning;Muscular Weakness;Shortness of Breath           6 Minute Walk:  6 Minute Walk    Row Name 01/04/21 1051         6 Minute Walk   Phase Initial     Distance 1000 feet     Walk Time 6 minutes     # of Rest Breaks 0  Had to stop pt a couple of times due to pulse ox not picking up oxygen saturation     MPH 1.89     METS 3.32     RPE 11     Perceived Dyspnea  1     VO2 Peak 11.62     Symptoms Yes (comment)     Comments Pt has a dry cough with any type of exertion     Resting HR 88 bpm     Resting BP 116/78     Resting Oxygen Saturation  100 %     Exercise Oxygen Saturation  during 6 min walk 88 %     Max Ex. HR 101 bpm     Max Ex. BP 134/84     2 Minute Post BP 134/84           Interval HR   1 Minute HR 101     2 Minute HR 98     3 Minute HR 100     4 Minute HR 101     5 Minute HR 101     6 Minute HR 95     2 Minute Post HR 82     Interval Heart Rate? Yes           Interval Oxygen   Interval Oxygen? Yes     Baseline Oxygen Saturation % 100 %     1 Minute Oxygen Saturation % 98 %     1 Minute Liters of Oxygen 0 L     2 Minute Oxygen Saturation % 97 %     2 Minute Liters of Oxygen 0 L     3 Minute Oxygen Saturation % 97 %     3 Minute Liters of Oxygen 0 L     4 Minute Oxygen Saturation % 88 %  Pulse ox was not reading correctly. Do not think this 88% is accurate. Had pt stop for a second and oxygen saturation immediately increase to 99%.  4 Minute Liters of Oxygen 0 L     5 Minute Oxygen Saturation % 99 %     5 Minute Liters of  Oxygen 0 L     6 Minute Oxygen Saturation % 98 %     6 Minute Liters of Oxygen 0 L     2 Minute Post Oxygen Saturation % 100 %     2 Minute Post Liters of Oxygen 0 L            Oxygen Initial Assessment:  Oxygen Initial Assessment - 01/04/21 0959      Home Oxygen   Home Oxygen Device None    Sleep Oxygen Prescription None    Home Exercise Oxygen Prescription None    Home Resting Oxygen Prescription None      Initial 6 min Walk   Oxygen Used None      Program Oxygen Prescription   Program Oxygen Prescription None      Intervention   Short Term Goals To learn and understand importance of monitoring SPO2 with pulse oximeter and demonstrate accurate use of the pulse oximeter.;To learn and understand importance of maintaining oxygen saturations>88%;To learn and demonstrate proper pursed lip breathing techniques or other breathing techniques.;To learn and demonstrate proper use of respiratory medications    Long  Term Goals Verbalizes importance of monitoring SPO2 with pulse oximeter and return demonstration;Maintenance of O2 saturations>88%;Exhibits proper breathing techniques, such as pursed lip breathing or other method taught during program session;Compliance with respiratory medication;Demonstrates proper use of MDI's           Oxygen Re-Evaluation:  Oxygen Re-Evaluation    Row Name 02/02/21 0755 02/26/21 1404           Program Oxygen Prescription   Program Oxygen Prescription None None             Home Oxygen   Home Oxygen Device None None      Sleep Oxygen Prescription None None      Home Exercise Oxygen Prescription None None      Home Resting Oxygen Prescription None None             Goals/Expected Outcomes   Short Term Goals To learn and understand importance of monitoring SPO2 with pulse oximeter and demonstrate accurate use of the pulse oximeter.;To learn and understand importance of maintaining oxygen saturations>88%;To learn and demonstrate proper pursed  lip breathing techniques or other breathing techniques.;To learn and demonstrate proper use of respiratory medications To learn and understand importance of monitoring SPO2 with pulse oximeter and demonstrate accurate use of the pulse oximeter.;To learn and understand importance of maintaining oxygen saturations>88%;To learn and demonstrate proper pursed lip breathing techniques or other breathing techniques.;To learn and demonstrate proper use of respiratory medications      Long  Term Goals Verbalizes importance of monitoring SPO2 with pulse oximeter and return demonstration;Maintenance of O2 saturations>88%;Exhibits proper breathing techniques, such as pursed lip breathing or other method taught during program session;Compliance with respiratory medication;Demonstrates proper use of MDI's Verbalizes importance of monitoring SPO2 with pulse oximeter and return demonstration;Maintenance of O2 saturations>88%;Exhibits proper breathing techniques, such as pursed lip breathing or other method taught during program session;Compliance with respiratory medication;Demonstrates proper use of MDI's      Comments -- Pt is becoming independent with pursed lip breathing and knowing when to use it      Goals/Expected Outcomes compliance and understanding of oxygen saturation and pursed lip breathing. compliance and understanding of oxygen saturation and pursed  lip breathing.             Oxygen Discharge (Final Oxygen Re-Evaluation):  Oxygen Re-Evaluation - 02/26/21 1404      Program Oxygen Prescription   Program Oxygen Prescription None      Home Oxygen   Home Oxygen Device None    Sleep Oxygen Prescription None    Home Exercise Oxygen Prescription None    Home Resting Oxygen Prescription None      Goals/Expected Outcomes   Short Term Goals To learn and understand importance of monitoring SPO2 with pulse oximeter and demonstrate accurate use of the pulse oximeter.;To learn and understand importance of  maintaining oxygen saturations>88%;To learn and demonstrate proper pursed lip breathing techniques or other breathing techniques.;To learn and demonstrate proper use of respiratory medications    Long  Term Goals Verbalizes importance of monitoring SPO2 with pulse oximeter and return demonstration;Maintenance of O2 saturations>88%;Exhibits proper breathing techniques, such as pursed lip breathing or other method taught during program session;Compliance with respiratory medication;Demonstrates proper use of MDI's    Comments Pt is becoming independent with pursed lip breathing and knowing when to use it    Goals/Expected Outcomes compliance and understanding of oxygen saturation and pursed lip breathing.           Initial Exercise Prescription:  Initial Exercise Prescription - 01/04/21 1000      Date of Initial Exercise RX and Referring Provider   Date 01/04/21    Referring Provider Dr. Georgetta Haber)    Expected Discharge Date 03/11/21      Treadmill   MPH 1.8    Minutes 15      NuStep   Level 2    SPM 80    Minutes 15      Prescription Details   Frequency (times per week) 2    Duration Progress to 30 minutes of continuous aerobic without signs/symptoms of physical distress      Intensity   THRR 40-80% of Max Heartrate 68-137    Ratings of Perceived Exertion 11-13    Perceived Dyspnea 0-4      Progression   Progression Continue to progress workloads to maintain intensity without signs/symptoms of physical distress.      Resistance Training   Training Prescription Yes    Weight blue bands    Reps 10-15           Perform Capillary Blood Glucose checks as needed.  Exercise Prescription Changes:  Exercise Prescription Changes    Row Name 01/12/21 1100 01/26/21 1100 02/09/21 1200 02/23/21 1300 02/23/21 1500     Response to Exercise   Blood Pressure (Admit) 112/66 110/64 114/72 104/60 --   Blood Pressure (Exercise) 144/86 118/76 164/80 130/80 --   Blood Pressure (Exit)  100/60 114/66 107/72 106/70 --   Heart Rate (Admit) 99 bpm 90 bpm 83 bpm 99 bpm --   Heart Rate (Exercise) 121 bpm 107 bpm 125 bpm 128 bpm --   Heart Rate (Exit) 82 bpm 87 bpm 95 bpm 105 bpm --   Oxygen Saturation (Admit) 99 % 100 % 100 % 99 % --   Oxygen Saturation (Exercise) 97 % 97 % 95 % 96 % --   Oxygen Saturation (Exit) 100 % 100 % 99 % 100 % --   Rating of Perceived Exertion (Exercise) 11 10 11 10  --   Perceived Dyspnea (Exercise) 1 2 1 1  --   Duration Continue with 30 min of aerobic exercise without signs/symptoms of physical distress. Progress to  30 minutes of  aerobic without signs/symptoms of physical distress Progress to 45 minutes of aerobic exercise without signs/symptoms of physical distress Progress to 30 minutes of  aerobic without signs/symptoms of physical distress --   Intensity Other (comment)  40%-80% HR Max THRR unchanged THRR unchanged THRR unchanged --     Progression   Progression Continue to progress workloads to maintain intensity without signs/symptoms of physical distress. Continue to progress workloads to maintain intensity without signs/symptoms of physical distress. Continue to progress workloads to maintain intensity without signs/symptoms of physical distress. Continue to progress workloads to maintain intensity without signs/symptoms of physical distress. --     Horticulturist, commercial Prescription Yes Yes Yes Yes --   Weight blue bands blue bands blue bands blue bands --   Reps 10-15 10-15 10-15 10-15 --   Time 10 Minutes 10 Minutes 10 Minutes 10 Minutes --     Treadmill   MPH 1.8 2.2 2.4 2.6 --   Grade 0 0 1 0.5 --   Minutes 15 15 15 15  --     NuStep   Level 2 3 4 3   pt is having a lot of issues with allergies today WL decreased --   SPM 80 80 80 80 --   Minutes 15 15 15 15  --   METs 2.3 2.5 2.8 2.6 --     Home Exercise Plan   Plans to continue exercise at -- -- -- -- Home (comment)  Outdoor biking and walking   Frequency -- -- -- -- Add  3 additional days to program exercise sessions.   Initial Home Exercises Provided -- -- -- -- 02/23/21          Exercise Comments:  Exercise Comments    Row Name 01/12/21 1152 02/23/21 1557         Exercise Comments Patient completed first day of exercise and tolerated well with no concerns. She did have mild shortness of breath throughout her exercise session. She was able to complete 30 minutes of cardiovascular exercise with no rest breaks and completed resistance training with no mobility isssues. Completed home exercise prescription with pt. Mclees does some form of resistance training most days of the week. We discussed adding in cardio, such as walking and biking. Pt was receptive. Also gave her ways to progress home exercise.             Exercise Goals and Review:  Exercise Goals    Row Name 01/04/21 1050             Exercise Goals   Increase Physical Activity Yes       Intervention Provide advice, education, support and counseling about physical activity/exercise needs.;Develop an individualized exercise prescription for aerobic and resistive training based on initial evaluation findings, risk stratification, comorbidities and participant's personal goals.       Expected Outcomes Short Term: Attend rehab on a regular basis to increase amount of physical activity.;Long Term: Add in home exercise to make exercise part of routine and to increase amount of physical activity.;Long Term: Exercising regularly at least 3-5 days a week.       Increase Strength and Stamina Yes       Intervention Provide advice, education, support and counseling about physical activity/exercise needs.;Develop an individualized exercise prescription for aerobic and resistive training based on initial evaluation findings, risk stratification, comorbidities and participant's personal goals.       Expected Outcomes Short Term: Increase workloads from initial exercise  prescription for resistance, speed, and  METs.;Short Term: Perform resistance training exercises routinely during rehab and add in resistance training at home;Long Term: Improve cardiorespiratory fitness, muscular endurance and strength as measured by increased METs and functional capacity (6MWT)       Able to understand and use rate of perceived exertion (RPE) scale Yes       Intervention Provide education and explanation on how to use RPE scale       Expected Outcomes Short Term: Able to use RPE daily in rehab to express subjective intensity level;Long Term:  Able to use RPE to guide intensity level when exercising independently       Able to understand and use Dyspnea scale Yes       Intervention Provide education and explanation on how to use Dyspnea scale       Expected Outcomes Short Term: Able to use Dyspnea scale daily in rehab to express subjective sense of shortness of breath during exertion;Long Term: Able to use Dyspnea scale to guide intensity level when exercising independently       Knowledge and understanding of Target Heart Rate Range (THRR) Yes       Intervention Provide education and explanation of THRR including how the numbers were predicted and where they are located for reference       Expected Outcomes Short Term: Able to state/look up THRR;Long Term: Able to use THRR to govern intensity when exercising independently;Short Term: Able to use daily as guideline for intensity in rehab       Understanding of Exercise Prescription Yes       Intervention Provide education, explanation, and written materials on patient's individual exercise prescription       Expected Outcomes Short Term: Able to explain program exercise prescription;Long Term: Able to explain home exercise prescription to exercise independently              Exercise Goals Re-Evaluation :  Exercise Goals Re-Evaluation    Row Name 02/02/21 0751 02/26/21 1359           Exercise Goal Re-Evaluation   Exercise Goals Review Increase Physical  Activity;Increase Strength and Stamina;Able to understand and use rate of perceived exertion (RPE) scale;Able to understand and use Dyspnea scale;Knowledge and understanding of Target Heart Rate Range (THRR);Understanding of Exercise Prescription Increase Physical Activity;Increase Strength and Stamina;Able to understand and use rate of perceived exertion (RPE) scale;Able to understand and use Dyspnea scale;Knowledge and understanding of Target Heart Rate Range (THRR);Understanding of Exercise Prescription      Comments Pt has completed 6 exercise sessions and has tolerated very well so far. She is a very high functioning pt but does experience mild shortness of breath with exercise. She has made steady progressions with workload and MET increases. She is currently exercising at 2.6 METS on level 3 on the Nustep and 4.5 METS on the treadmill. Will continue to monitor and progress as she is able. Will also be discussing home exercise prescription in the next couple of weeks. Pt has completed 11 exercise sessions and has been very good about increasing her own workloads when she feels ready. She has also been consistent with MET level increases. We are currently working on progressing incline on the treadmill because she states she struggles the most with walking up inclines or stairs. She has slowly progressed toward some incline on the treadmill. She continues to have a dry cough with exertion, especially walking. She is exercising at 2.7 METS on the Nustep 3.4  METS on the treadmill. Will continue to monitor and progress as she is able.      Expected Outcomes Through exercise at rehab and home, the patient will decrease shortness of breath with daily activities and feel confident in carrying out an exercise regimn at home. Through exercise at rehab and home, the patient will decrease shortness of breath with daily activities and feel confident in carrying out an exercise regimn at home.             Discharge  Exercise Prescription (Final Exercise Prescription Changes):  Exercise Prescription Changes - 02/23/21 1500      Home Exercise Plan   Plans to continue exercise at Home (comment)   Outdoor biking and walking   Frequency Add 3 additional days to program exercise sessions.    Initial Home Exercises Provided 02/23/21           Nutrition:  Target Goals: Understanding of nutrition guidelines, daily intake of sodium <1568m, cholesterol <2058m calories 30% from fat and 7% or less from saturated fats, daily to have 5 or more servings of fruits and vegetables.  Biometrics:  Pre Biometrics - 01/04/21 0955      Pre Biometrics   Height 5' 7"  (1.702 m)    Weight 91.4 kg    BMI (Calculated) 31.55    Grip Strength 24 kg            Nutrition Therapy Plan and Nutrition Goals:  Nutrition Therapy & Goals - 01/14/21 1131      Nutrition Therapy   Diet TLC    Drug/Food Interactions Statins/Certain Fruits      Personal Nutrition Goals   Nutrition Goal Pt to identify food quantities necessary to achieve weight loss of 6-24 lb at graduation from cardiac rehab.    Personal Goal #2 Pt to build a healthy plate including vegetables, fruits, whole grains, and low-fat dairy products in a heart healthy meal plan.      Intervention Plan   Intervention Prescribe, educate and counsel regarding individualized specific dietary modifications aiming towards targeted core components such as weight, hypertension, lipid management, diabetes, heart failure and other comorbidities.;Nutrition handout(s) given to patient.    Expected Outcomes Short Term Goal: Understand basic principles of dietary content, such as calories, fat, sodium, cholesterol and nutrients.           Nutrition Assessments:  MEDIFICTS Score Key:  ?70 Need to make dietary changes   40-70 Heart Healthy Diet  ? 40 Therapeutic Level Cholesterol Diet  Flowsheet Row [facility]ULMONARY REHAB OTHER RESPIRATORY [facility] Total [facility] on [facility] 62     Picture Your Plate Scores:  <4<58nhealthy dietary pattern with much room for improvement.  41-50 Dietary pattern unlikely to meet recommendations for good health and room for improvement.  51-60 More healthful dietary pattern, with some room for improvement.   >60 Healthy dietary pattern, although there may be some specific behaviors that could be improved.    Nutrition Goals Re-Evaluation:  Nutrition Goals Re-Evaluation    [place] 01/14/21 1131 01/27/21 1451 02/26/21 0816         Goals   Current Weight 203 lb (92.1 kg) 201 lb 8 oz (91.4 kg) 202 lb 6.1 oz (91.8 kg)     Nutrition Goal -- Pt to identify food quantities necessary to achieve weight loss of 6-24 lb at graduation from cardiac rehab. Pt to identify food quantities necessary to achieve  weight loss of 6-24 lb at graduation from cardiac rehab.     Comment -- 2 lb weight loss this week Weight maintained. Pt choosing variety of vegetables and new recipes/cooking methods.     Expected Outcome Weight loss Weight loss Weight loss           Personal Goal #2 Re-Evaluation   Personal Goal #2 -- Pt to build a healthy plate including vegetables, fruits, whole grains, and low-fat dairy products in a heart healthy meal plan. Pt to build a healthy plate including vegetables, fruits, whole grains, and low-fat dairy products in a heart healthy meal plan.            Nutrition Goals Discharge (Final Nutrition Goals Re-Evaluation):  Nutrition Goals Re-Evaluation - 02/26/21 0816      Goals   Current Weight 202 lb 6.1 oz (91.8 kg)    Nutrition Goal Pt to identify food quantities necessary to achieve weight loss of 6-24 lb at graduation from cardiac rehab.    Comment Weight maintained. Pt choosing variety of vegetables and new recipes/cooking methods.    Expected Outcome Weight loss      Personal Goal #2 Re-Evaluation   Personal Goal #2 Pt to build a healthy  plate including vegetables, fruits, whole grains, and low-fat dairy products in a heart healthy meal plan.           Psychosocial: Target Goals: Acknowledge presence or absence of significant depression and/or stress, maximize coping skills, provide positive support system. Participant is able to verbalize types and ability to use techniques and skills needed for reducing stress and depression.  Initial Review & Psychosocial Screening:  Initial Psych Review & Screening - 01/04/21 1003      Initial Review   Current issues with None Identified      Family Dynamics   Good Support System? Yes   friends and family     Barriers   Psychosocial barriers to participate in program There are no identifiable barriers or psychosocial needs.      Screening Interventions   Interventions Encouraged to exercise    Expected Outcomes Long Term goal: The participant improves quality of Life and PHQ9 Scores as seen by post scores and/or verbalization of changes           Quality of Life Scores:  Scores of 19 and below usually indicate a poorer quality of life in these areas.  A difference of  2-3 points is a clinically meaningful difference.  A difference of 2-3 points in the total score of the Quality of Life Index has been associated with significant improvement in overall quality of life, self-image, physical symptoms, and general health in studies assessing change in quality of life.  PHQ-9: Recent Review Flowsheet Data    Depression screen [facility] 2/9 01/04/2021   Decreased Interest 0   Down, Depressed, Hopeless 0   PHQ - 2 Score 0   Altered sleeping 0   Tired, decreased energy 0   Change in appetite 0   Feeling bad or failure about yourself  0   Trouble concentrating 0   Moving slowly or fidgety/restless 0   Suicidal thoughts 0   Difficult doing work/chores Not difficult at all     Interpretation of Total Score  Total Score Depression Severity:  1-4 = Minimal depression, 5-9 = Mild  depression, 10-14 = Moderate depression, 15-19 = Moderately severe depression, 20-27 = Severe depression   Psychosocial Evaluation and Intervention:  Psychosocial Evaluation - 01/04/21 1005  Psychosocial Evaluation & Interventions   Interventions Encouraged to exercise with the program and follow exercise prescription    Continue Psychosocial Services  No Follow up required           Psychosocial Re-Evaluation:  Psychosocial Re-Evaluation    [place] Name 02/01/21 1129 02/23/21 0834           Psychosocial Re-Evaluation   Current issues with None Identified None Identified      Comments Riannon has a positive attitude and has no psychosocial concerns at this time, has a supportive family. No psychosocial concerns identified at this time.      Expected Outcomes -- For Kimba to continue to be free of psychosocial concerns while particpating in pulmonary rehab.      Interventions Encouraged to attend Pulmonary Rehabilitation for the exercise Encouraged to attend Pulmonary Rehabilitation for the exercise      Continue Psychosocial Services  No Follow up required No Follow up required             Psychosocial Discharge (Final Psychosocial Re-Evaluation):  Psychosocial Re-Evaluation - 02/23/21 0834      Psychosocial Re-Evaluation   Current issues with None Identified    Comments No psychosocial concerns identified at this time.    Expected Outcomes For Charrie to continue to be free of psychosocial concerns while particpating in pulmonary rehab.    Interventions Encouraged to attend Pulmonary Rehabilitation for the exercise    Continue Psychosocial Services  No Follow up required           Education: Education Goals: Education classes will be provided on a weekly basis, covering required topics. Participant will state understanding/return demonstration of topics presented.  Learning Barriers/Preferences:  Learning Barriers/Preferences - 01/04/21 1005      Learning  Barriers/Preferences   Learning Barriers None    Learning Preferences Written Material;Verbal Instruction;Individual Instruction           Education Topics: Risk Factor Reduction:  -Group instruction that is supported by a PowerPoint presentation. Instructor discusses the definition of a risk factor, different risk factors for pulmonary disease, and how the heart and lungs work together.   Flowsheet Row PULMONARY REHAB OTHER RESPIRATORY from 02/04/2021 in [place]  Date 02/04/21  Educator Handout-Jess  Instruction Review Code 1- Verbalizes Understanding      Nutrition for Pulmonary Patient:  -Group instruction provided by PowerPoint slides, verbal discussion, and written materials to support subject matter. The instructor gives an explanation and review of healthy diet recommendations, which includes a discussion on weight management, recommendations for fruit and vegetable consumption, as well as protein, fluid, caffeine, fiber, sodium, sugar, and alcohol. Tips for eating when patients are short of breath are discussed.   Pursed Lip Breathing:  -Group instruction that is supported by demonstration and informational handouts. Instructor discusses the benefits of pursed lip and diaphragmatic breathing and detailed demonstration on how to preform both.     Oxygen Safety:  -Group instruction provided by PowerPoint, verbal discussion, and written material to support subject matter. There is an overview of "What is Oxygen" and "Why do we need it".  Instructor also reviews how to create a safe environment for oxygen use, the importance of using oxygen as prescribed, and the risks of noncompliance. There is a brief discussion on traveling with oxygen and resources the patient may utilize.   Oxygen Equipment:  -Group instruction provided by [facility] utilizing handouts, written materials, and equipment demonstrations.   Signs and  Symptoms:  -Group  instruction provided by written material and verbal discussion to support subject matter. Warning signs and symptoms of infection, stroke, and heart attack are reviewed and when to call the physician/911 reinforced. Tips for preventing the spread of infection discussed.   Advanced Directives:  -Group instruction provided by verbal instruction and written material to support subject matter. Instructor reviews Advanced Directive laws and proper instruction for filling out document.   Pulmonary Video:  -Group video education that reviews the importance of medication and oxygen compliance, exercise, good nutrition, pulmonary hygiene, and pursed lip and diaphragmatic breathing for the pulmonary patient.   Exercise for the Pulmonary Patient:  -Group instruction that is supported by a PowerPoint presentation. Instructor discusses benefits of exercise, core components of exercise, frequency, duration, and intensity of an exercise routine, importance of utilizing pulse oximetry during exercise, safety while exercising, and options of places to exercise outside of rehab.     Pulmonary Medications:  -Verbally interactive group education provided by instructor with focus on inhaled medications and proper administration.   Anatomy and Physiology of the Respiratory System and Intimacy:  -Group instruction provided by PowerPoint, verbal discussion, and written material to support subject matter. Instructor reviews respiratory cycle and anatomical components of the respiratory system and their functions. Instructor also reviews differences in obstructive and restrictive respiratory diseases with examples of each. Intimacy, Sex, and Sexuality differences are reviewed with a discussion on how relationships can change when diagnosed with pulmonary disease. Common sexual concerns are reviewed. Flowsheet Row PULMONARY REHAB OTHER RESPIRATORY from 02/04/2021 in [place]  Date  01/14/21  Educator Bufford Lope  Instruction Review Code 1- Verbalizes Understanding      MD DAY -A group question and answer session with a medical doctor that allows participants to ask questions that relate to their pulmonary disease state.   OTHER EDUCATION -Group or individual verbal, written, or video instructions that support the educational goals of the pulmonary rehab program.   Holiday Eating Survival Tips:  -Group instruction provided by PowerPoint slides, verbal discussion, and written materials to support subject matter. The instructor gives patients tips, tricks, and techniques to help them not only survive but enjoy the holidays despite the onslaught of food that accompanies the holidays.   Knowledge Questionnaire Score:  Knowledge Questionnaire Score - 01/04/21 1051      Knowledge Questionnaire Score   Pre Score 15/18           Core Components/Risk Factors/Patient Goals at Admission:  Personal Goals and Risk Factors at Admission - 01/04/21 1005      Core Components/Risk Factors/Patient Goals on Admission   Improve shortness of breath with ADL's Yes           Core Components/Risk Factors/Patient Goals Review:   Goals and Risk Factor Review    Row Name 01/04/21 1006 02/01/21 1130 02/23/21 0836         Core Components/Risk Factors/Patient Goals Review   Personal Goals Review Develop more efficient breathing techniques such as purse lipped breathing and diaphragmatic breathing and practicing self-pacing with activity.;Increase knowledge of respiratory medications and ability to use respiratory devices properly.;Improve shortness of breath with ADL's Develop more efficient breathing techniques such as purse lipped breathing and diaphragmatic breathing and practicing self-pacing with activity.;Increase knowledge of respiratory medications and ability to use respiratory devices properly.;Improve shortness of breath with ADL's Develop more efficient breathing  techniques such as purse lipped breathing and diaphragmatic breathing and practicing self-pacing with activity.;Increase knowledge  of respiratory medications and ability to use respiratory devices properly.;Improve shortness of breath with ADL's     Review -- Resch has attended 6 exercise sessions, is progressing well, seems to enjoy exercising and pushing herself to new limits, is exercising @ 2.6 mets on the nustep and 2.2 mph on the treadmill. 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     Expected Outcomes -- See admission goals. For Lakeitha to exercise independently after graduating from pulmonary rehab, which is 03/11/2021.            Core Components/Risk Factors/Patient Goals at Discharge (Final Review):   Goals and Risk Factor Review - 02/23/21 0836      Core Components/Risk Factors/Patient Goals Review   Personal Goals Review Develop more efficient breathing techniques such as purse lipped breathing and diaphragmatic breathing and practicing self-pacing with activity.;Increase knowledge of respiratory medications and ability to use respiratory devices properly.;Improve shortness of breath with ADL's    Review Karly works hard and has improved her deconditioning, she is learning hoe to exercise safely for someone with ILD.    Expected Outcomes For Daviona to exercise independently after graduating from pulmonary rehab, which is 03/11/2021.           ITP Comments:   Comments: ITP REVIEW Pt is making expected progress toward pulmonary rehab goals after completing 11 sessions. Recommend continued exercise, life style modification, education, and utilization of breathing techniques to increase stamina and strength and decrease shortness of breath with exertion.

## 2021-03-04 ENCOUNTER — Other Ambulatory Visit: Payer: Self-pay

## 2021-03-04 ENCOUNTER — Encounter (HOSPITAL_COMMUNITY)
Admission: RE | Admit: 2021-03-04 | Discharge: 2021-03-04 | Disposition: A | Discharge status: Home / Self Care | Payer: No Typology Code available for payment source | Source: Ambulatory Visit | Attending: Cardiology | Admitting: Cardiology

## 2021-03-04 ENCOUNTER — Encounter (HOSPITAL_COMMUNITY): Payer: No Typology Code available for payment source

## 2021-03-04 DIAGNOSIS — J849 Interstitial pulmonary disease, unspecified: Secondary | ICD-10-CM | POA: Diagnosis not present

## 2021-03-04 NOTE — Progress Notes (Signed)
Daily Session Note  Patient Details  Name: Chelsea Jennings MRN: [id number] Date of Birth: 06/12/1971 Referring Provider:   April Manson Pulmonary Rehab Walk Test from 01/04/2021 in [place]  Referring Provider Dr. Georgetta Haber)      Encounter Date: 03/04/2021  Check In:  Session Check In - 03/04/21 1141      Check-In   Supervising physician immediately available to respond to emergencies [facility] immediately available    Physician(s) Dr. Kurtis Bushman    Location [facility]    Staff Present Rosebud Poles, RN, BSN;Amariya Liskey Ysidro Evert, RN;David Saguache, MS, ACSM-CEP, CCRP, Exercise Physiologist    Virtual Visit No    Medication changes reported     No    Fall or balance concerns reported    No    Tobacco Cessation No Change    Warm-up and Cool-down Performed on first and last piece of equipment    Resistance Training Performed Yes    VAD Patient? No    PAD/SET Patient? No      Pain Assessment   Currently in Pain? No/denies    Multiple Pain Sites No           Capillary Blood Glucose: No results found for this or any previous visit (from the past 24 hour(s)).    Social History   Tobacco Use  Smoking Status Not on file  Smokeless Tobacco Not on file    Goals Met:  Exercise tolerated well No report of cardiac concerns or symptoms Strength training completed today  Goals Unmet:  Not Applicable  Comments: Service time is from 1020 to 1125    Dr. Fransico Him is Medical Director for Cardiac Rehab at [facility].

## 2021-03-09 ENCOUNTER — Encounter (HOSPITAL_COMMUNITY)
Admission: RE | Admit: 2021-03-09 | Discharge: 2021-03-09 | Disposition: A | Discharge status: Home / Self Care | Payer: No Typology Code available for payment source | Source: Ambulatory Visit | Attending: Cardiology | Admitting: Cardiology

## 2021-03-09 ENCOUNTER — Other Ambulatory Visit: Payer: Self-pay

## 2021-03-09 ENCOUNTER — Encounter (HOSPITAL_COMMUNITY): Payer: No Typology Code available for payment source

## 2021-03-09 VITALS — Wt 202.2 lb

## 2021-03-09 DIAGNOSIS — J849 Interstitial pulmonary disease, unspecified: Secondary | ICD-10-CM | POA: Diagnosis present

## 2021-03-09 NOTE — Progress Notes (Signed)
Daily Session Note  Patient Details  Name: Chelsea Jennings MRN: [id number] Date of Birth: 06/09/1971 Referring Provider:   April Manson Pulmonary Rehab Walk Test from 01/04/2021 in [place]  Referring Provider Dr. Georgetta Haber)      Encounter Date: 03/09/2021  Check In:  Session Check In - 03/09/21 1214      Check-In   Supervising physician immediately available to respond to emergencies [facility] immediately available    Physician(s) Dr. Kurtis Bushman    [facility]    Staff Present Rosebud Poles, RN, Isaac Laud, MS, ACSM-CEP, Exercise Physiologist;Annedrea Rosezella Florida, RN, MHA;Lisa Ysidro Evert, RN;Carlette Wilber Oliphant, RN, Milus Glazier, MS, ACSM-CEP, CCRP, Exercise Physiologist    Virtual Visit No    Medication changes reported     No    Fall or balance concerns reported    No    Tobacco Cessation No Change    Warm-up and Cool-down Performed on first and last piece of equipment    Resistance Training Performed Yes    VAD Patient? No    PAD/SET Patient? No      Pain Assessment   Currently in Pain? No/denies    Multiple Pain Sites No           Capillary Blood Glucose: No results found for this or any previous visit (from the past 24 hour(s)).   Exercise Prescription Changes - 03/09/21 1500      Response to Exercise   Blood Pressure (Admit) 110/62    Blood Pressure (Exercise) 120/84    Blood Pressure (Exit) 114/74    Heart Rate (Admit) 94 bpm    Heart Rate (Exercise) 140 bpm    Heart Rate (Exit) 103 bpm    Oxygen Saturation (Admit) 99 %    Oxygen Saturation (Exercise) 94 %    Oxygen Saturation (Exit) 99 %    Rating of Perceived Exertion (Exercise) 11    Perceived Dyspnea (Exercise) 2    Duration Continue with 30 min of aerobic exercise without signs/symptoms of physical distress.    Intensity THRR unchanged      Progression   Progression Continue to progress workloads to maintain intensity without  signs/symptoms of physical distress.      Resistance Training   Training Prescription Yes    Weight blue bands    Reps 10-15    Time 10 Minutes      Treadmill   MPH 2.8    Grade 1.5    Minutes 15      NuStep   Level 4    SPM 80    Minutes 15    METs 2.8           Social History   Tobacco Use  Smoking Status Not on file  Smokeless Tobacco Not on file    Goals Met:  Proper associated with RPD/PD & O2 Sat Independence with exercise equipment Exercise tolerated well No report of cardiac concerns or symptoms Strength training completed today  Goals Unmet:  Not Applicable  Comments: Service time is from 1015 to 1130    Dr. Fransico Him is Medical Director for Cardiac Rehab at [facility].

## 2021-03-11 ENCOUNTER — Other Ambulatory Visit: Payer: Self-pay

## 2021-03-11 ENCOUNTER — Encounter (HOSPITAL_COMMUNITY)
Admission: RE | Admit: 2021-03-11 | Discharge: 2021-03-11 | Disposition: A | Discharge status: Home / Self Care | Payer: No Typology Code available for payment source | Source: Ambulatory Visit | Attending: Cardiology | Admitting: Cardiology

## 2021-03-11 ENCOUNTER — Encounter (HOSPITAL_COMMUNITY): Payer: No Typology Code available for payment source

## 2021-03-11 DIAGNOSIS — J849 Interstitial pulmonary disease, unspecified: Secondary | ICD-10-CM

## 2021-03-11 NOTE — Progress Notes (Signed)
Daily Session Note  Patient Details  Name: Chelsea Jennings MRN: [id number] Date of Birth: 1971-03-01 Referring Provider:   April Manson Pulmonary Rehab Walk Test from 01/04/2021 in [place]  Referring Provider Dr. Georgetta Haber)      Encounter Date: 03/11/2021  Check In:  Session Check In - 03/11/21 1100      Check-In   Supervising physician immediately available to respond to emergencies [facility] immediately available    Physician(s) Dr. Arbutus Ped    [facility]    Staff Present Rosebud Poles, RN, BSN;Lisa Ysidro Evert, RN;Ashaki Frosch Hassell Done, MS, ACSM-CEP, Exercise Physiologist;Carlette Wilber Oliphant, RN, Milus Glazier, MS, ACSM-CEP, CCRP, Exercise Physiologist;Annedrea Rosezella [place], RN, [place]    Virtual Visit No    Medication changes reported     No    Fall or balance concerns reported    No    Tobacco Cessation No Change    Warm-up and Cool-down Performed as group-led instruction    Resistance Training Performed No    VAD Patient? No    PAD/SET Patient? No      Pain Assessment   Currently in Pain? No/denies    Multiple Pain Sites No           Capillary Blood Glucose: No results found for this or any previous visit (from the past 24 hour(s)).    Social History   Tobacco Use  Smoking Status Not on file  Smokeless Tobacco Not on file    Goals Met:  Proper associated with RPD/PD & O2 Sat Exercise tolerated well No report of cardiac concerns or symptoms Completed pulmonary undergrad program today  Goals Unmet:  Not Applicable  Comments: Service time is from 1015 to 1040    Dr. Fransico Him is Medical Director for Cardiac Rehab at [facility].

## 2021-08-24 ENCOUNTER — Other Ambulatory Visit: Payer: Self-pay

## 2021-08-24 ENCOUNTER — Ambulatory Visit (INDEPENDENT_AMBULATORY_CARE_PROVIDER_SITE_OTHER): Payer: No Typology Code available for payment source | Admitting: Pulmonary Disease

## 2021-08-24 ENCOUNTER — Encounter: Payer: Self-pay | Admitting: Pulmonary Disease

## 2021-08-24 VITALS — BP 128/76 | HR 80 | Temp 97.4°F | Ht 67.0 in | Wt 213.8 lb

## 2021-08-24 DIAGNOSIS — R0602 Shortness of breath: Secondary | ICD-10-CM | POA: Diagnosis not present

## 2021-08-24 DIAGNOSIS — J849 Interstitial pulmonary disease, unspecified: Secondary | ICD-10-CM

## 2021-08-24 DIAGNOSIS — J8410 Pulmonary fibrosis, unspecified: Secondary | ICD-10-CM

## 2021-08-24 LAB — CBC WITH DIFFERENTIAL/PLATELET
Basophils Absolute: 0 10*3/uL (ref 0.0–0.1)
Basophils Relative: 0.7 % (ref 0.0–3.0)
Eosinophils Absolute: 0 10*3/uL (ref 0.0–0.7)
Eosinophils Relative: 1.1 % (ref 0.0–5.0)
HCT: 39.6 % (ref 36.0–46.0)
Hemoglobin: 12.7 g/dL (ref 12.0–15.0)
Lymphocytes Relative: 23.6 % (ref 12.0–46.0)
Lymphs Abs: 0.7 10*3/uL (ref 0.7–4.0)
MCHC: 32.1 g/dL (ref 30.0–36.0)
MCV: 91.8 fl (ref 78.0–100.0)
Monocytes Absolute: 0.3 10*3/uL (ref 0.1–1.0)
Monocytes Relative: 11.9 % (ref 3.0–12.0)
Neutro Abs: 1.8 10*3/uL (ref 1.4–7.7)
Neutrophils Relative %: 62.7 % (ref 43.0–77.0)
Platelets: 265 10*3/uL (ref 150.0–400.0)
RBC: 4.32 Mil/uL (ref 3.87–5.11)
RDW: 13.3 % (ref 11.5–15.5)
WBC: 2.8 10*3/uL — ABNORMAL LOW (ref 4.0–10.5)

## 2021-08-24 LAB — COMPREHENSIVE METABOLIC PANEL
ALT: 26 U/L (ref 0–35)
AST: 17 U/L (ref 0–37)
Albumin: 4.2 g/dL (ref 3.5–5.2)
Alkaline Phosphatase: 59 U/L (ref 39–117)
BUN: 8 mg/dL (ref 6–23)
CO2: 31 mEq/L (ref 19–32)
Calcium: 9.5 mg/dL (ref 8.4–10.5)
Chloride: 103 mEq/L (ref 96–112)
Creatinine, Ser: 0.69 mg/dL (ref 0.40–1.20)
GFR: 101.35 mL/min (ref >60.00)
Glucose, Bld: 83 mg/dL (ref 70–99)
Potassium: 3.8 mEq/L (ref 3.5–5.1)
Sodium: 140 mEq/L (ref 135–145)
Total Bilirubin: 0.5 mg/dL (ref 0.2–1.2)
Total Protein: 7.7 g/dL (ref 6.0–8.3)

## 2021-08-24 NOTE — Progress Notes (Signed)
Chelsea Jennings    [id number]    1971-04-11  Primary Care Physician:Hicks, Sherin Quarry, MD  Referring Physician: Agustina Caroli, MD [address] [place],  [place] [address]  Chief complaint: Consult for CTD related interstitial lung disease, NSIP  HPI: 50 year old with history of hypertension, unspecified connective tissue disease, NSIP interstitial lung disease here to establish care She was previously followed by Dr.  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  She was put on prednisone 40 mg and then CellCept which is titrated up to 1 g twice daily.  She was able to be weaned off prednisone in December 2021 with improvement in symptoms of dyspnea. She also follows with Dr. Gevena Mart, rheumatology at the [facility] and is on Plaquenil. She completed pulmonary rehab in 2021  States that her dyspnea on exertion has been stable on CellCept.  She is also on Bactrim for prophylaxis. Had echocardiogram in 2021 at the [place] with no evidence of pulmonary hypertension  She also has history of GERD and was on omeprazole but developed side effects of rash.  She is currently on pantoprazole 20 mg a day which appears to control her symptoms Postnasal drip was treated with over-the-counter antihistamines and nasal spray.  She was on chlorpheniramine which at least helped with her anxiety.   Pets: She is to have cats in the past.  No pets currently Occupation: Works for [employer].  Previously in the [employer] Exposures: Exposed to chemicals at [employer] camp in [place] Smoking history: Never smoker Travel history: No significant recent travel Relevant family history:No significant family history of lung disease  Outpatient Encounter Medications as of 08/24/2021  Medication Sig   amLODipine (NORVASC) 5 MG tablet Take 5 mg by mouth daily.   chlorpheniramine (CHLOR-TRIMETON) 4 MG tablet Take 4 mg by mouth 2 (two) times  daily as needed for allergies.   clobetasol ointment (TEMOVATE) 0.05 % Apply 1 application topically 2 (two) times daily.   dorzolamide-timolol (COSOPT) 22.3-6.8 MG/ML ophthalmic solution 1 drop 2 (two) times daily.   hydroxychloroquine (PLAQUENIL) 200 MG tablet Take 200 mg by mouth 2 (two) times daily.   latanoprost (XALATAN) 0.005 % ophthalmic solution Place 1 drop into both eyes at bedtime.   mycophenolate (CELLCEPT) 500 MG tablet Take 500 mg by mouth 2 (two) times daily.   sulfamethoxazole-trimethoprim (BACTRIM) 200-40 MG/5ML suspension Take 160 mg by mouth once. 1 tablet Monday, Wednesday, and Friday   cetirizine (ZYRTEC) 10 MG chewable tablet Chew 10 mg by mouth daily.   omeprazole (PRILOSEC) 40 MG capsule Take 40 mg by mouth daily.   No facility-administered encounter medications on file as of 08/24/2021.    Allergies as of 08/24/2021   (No Known Allergies)    Past Medical History:  Diagnosis Date   Hypertension    Interstitial lung disease (HCC)     History reviewed. No pertinent surgical history.  No family history on file.  Social History   Socioeconomic History   Marital status: Single    Spouse name: Not on file   Number of children: Not on file   Years of education: Not on file   Highest education level: Associate degree: academic program  Occupational History   Occupation: Agricultural consultant    Employer: [employer]  Tobacco Use   Smoking status: Never   Smokeless tobacco: Never  Vaping Use   Vaping  Use: Never used  Substance and Sexual Activity   Alcohol use: Not Currently   Drug use: Never   Sexual activity: Not on file  Other Topics Concern   Not on file  Social History Narrative   Not on file   Social Determinants of Health   Financial Resource Strain: Not on file  Food Insecurity: Not on file  Transportation Needs: Not on file  Physical Activity: Not on file  Stress: Not on file  Social Connections: Not on file  Intimate Partner Violence: Not  on file    Review of systems: Review of Systems  Constitutional: Negative for fever and chills.  HENT: Negative.   Eyes: Negative for blurred vision.  Respiratory: as per HPI  Cardiovascular: Negative for chest pain and palpitations.  Gastrointestinal: Negative for vomiting, diarrhea, blood per rectum. Genitourinary: Negative for dysuria, urgency, frequency and hematuria.  Musculoskeletal: Negative for myalgias, back pain and joint pain.  Skin: Negative for itching and rash.  Neurological: Negative for dizziness, tremors, focal weakness, seizures and loss of consciousness.  Endo/Heme/Allergies: Negative for environmental allergies.  Psychiatric/Behavioral: Negative for depression, suicidal ideas and hallucinations.  All other systems reviewed and are negative.  Physical Exam: Blood pressure 128/76, pulse 80, temperature (!) 97.4 F (36.3 C), temperature source Oral, height 5\' 7"  (1.702 m), weight 213 lb 12.8 oz (97 kg), SpO2 99 %. Gen:      No acute distress HEENT:  EOMI, sclera anicteric Neck:     No masses; no thyromegaly Lungs:    Clear to auscultation bilaterally; normal respiratory effort CV:         Regular rate and rhythm; no murmurs Abd:      + bowel sounds; soft, non-tender; no palpable masses, no distension Ext:    No edema; adequate peripheral perfusion Skin:      Warm and dry; no rash Neuro: alert and oriented x 3 Psych: normal mood and affect   Data Reviewed: Reviewed data from the PFTs 2015-FVC 3.05 [79%], TLC 4.39 [80%], DLCO 65% 02/2020-FVC 2.47 [72%], TLC 3.39 [60%], DLCO 42%  CT chest 03/2020-baseline ILD with subpleural sparing, groundglass opacities with traction bronchiectasis.  Cysts versus honeycombing at the base.  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  She is off prednisone since 2021 Continues on Bactrim for prophylaxis  I will get records from her rheumatologist at the [facility] Check baseline labs including ANA, rheumatoid factor, CCP, comprehensive metabolic panel, CBC and proBNP Schedule high-res CT and PFTs Continue CellCept Bactrim and Plaquenil for now If there is progression then consider antifibrotics.  She may be a good transplant candidate as well.  Chronic cough Felt to be multifactorial from interstitial lung disease, GERD, postnasal drip Continue PPI, over-the-counter antihistamine  Plan/Recommendations: Check baseline labs, CT, PFTs Continue CellCept, Bactrim, Plaquenil Obtain records from rheumatology   [facility] MD [place] [facility] 08/25/2021, 1:27 PM  CC: 08/27/2021, MD

## 2021-08-24 NOTE — Patient Instructions (Signed)
We will get some labs today including ANA, rheumatoid factor, CCP, comprehensive metabolic panel and CBC with differential, proBNP for dyspnea  We will get records from the [place] including your rheumatologist Dr. Gevena Mart  Schedule high-resolution CT and PFTs Continue CellCept, Bactrim and Plaquenil Follow-up in 2 to 3 months.

## 2021-08-25 ENCOUNTER — Encounter: Payer: Self-pay | Admitting: Pulmonary Disease

## 2021-08-25 LAB — PRO B NATRIURETIC PEPTIDE: NT-Pro BNP: 11 pg/mL (ref 0–249)

## 2021-08-27 LAB — ANTI-NUCLEAR AB-TITER (ANA TITER)
ANA TITER: 1:320 {titer} — ABNORMAL HIGH
ANA Titer 1: 1:1280 {titer} — ABNORMAL HIGH

## 2021-08-27 LAB — RHEUMATOID FACTOR: Rheumatoid fact SerPl-aCnc: <14 IU/mL (ref <14)

## 2021-08-27 LAB — CYCLIC CITRUL PEPTIDE ANTIBODY, IGG: Cyclic Citrullin Peptide Ab: <16 UNITS

## 2021-08-27 LAB — ANA: Anti Nuclear Antibody (ANA): POSITIVE — AB

## 2021-09-14 ENCOUNTER — Other Ambulatory Visit: Payer: Self-pay

## 2021-09-14 ENCOUNTER — Ambulatory Visit (INDEPENDENT_AMBULATORY_CARE_PROVIDER_SITE_OTHER)
Admission: RE | Admit: 2021-09-14 | Discharge: 2021-09-14 | Disposition: A | Discharge status: Home / Self Care | Payer: No Typology Code available for payment source | Source: Ambulatory Visit | Attending: Pulmonary Disease | Admitting: Pulmonary Disease

## 2021-09-14 DIAGNOSIS — J8410 Pulmonary fibrosis, unspecified: Secondary | ICD-10-CM | POA: Diagnosis not present

## 2021-09-14 IMAGING — CT CT CHEST HIGH RESOLUTION
2 of 7 series · 14 of 36 positions shown, 17 images · non-contrast
Comparison: No priors.

CLINICAL DATA: 50-year-old female with history of chronic shortness
of breath with exertion and cough. History of nonspecific
interstitial pneumonia (NSIP).

EXAM:
CT CHEST WITHOUT CONTRAST
TECHNIQUE: Multidetector CT imaging of the chest was performed following the
standard protocol without intravenous contrast. High resolution
imaging of the lungs, as well as inspiratory and expiratory imaging,
was performed.

[Series 4: high resolution · axial · 0.65mm/px · z∈[-341,-104]mm · 11 of 285 slices shown, 14 images]
[im 24/285  mediastinal]
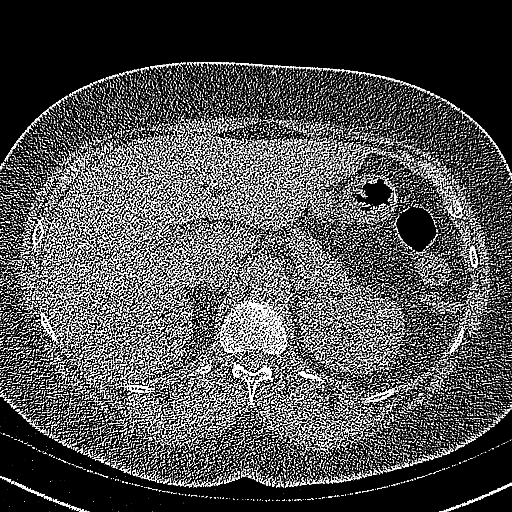
[im 24/285  lung]
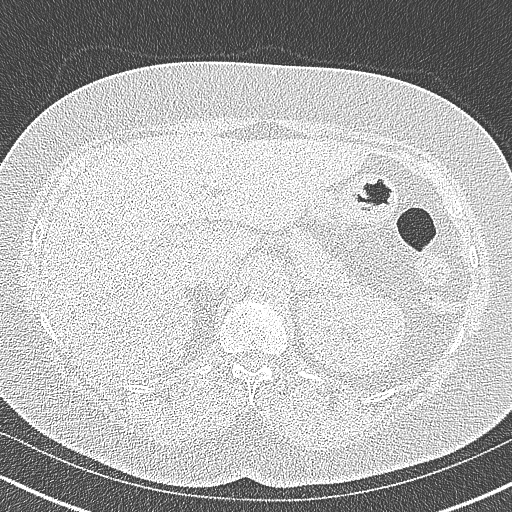
[im 48/285  lung]
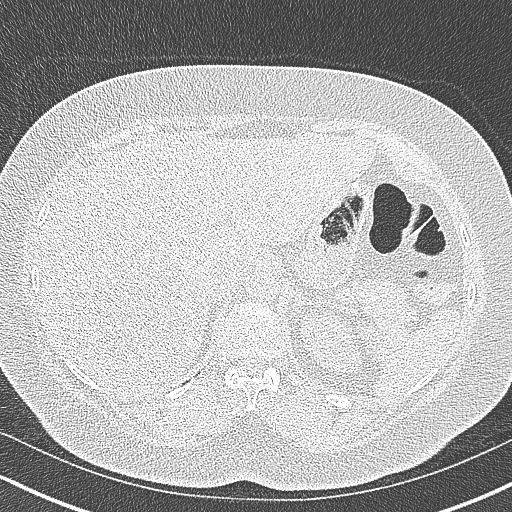
[im 72/285  lung]
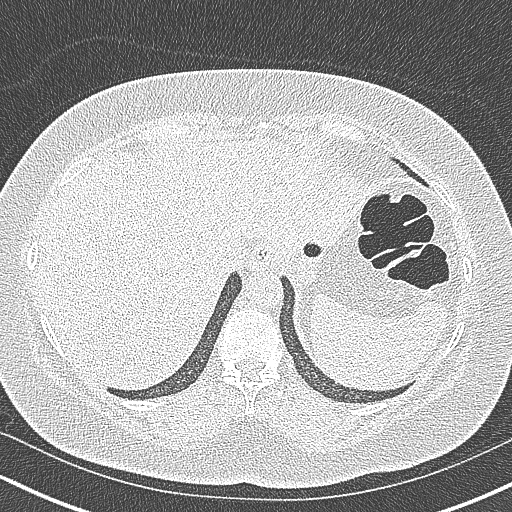
[im 95/285  lung]
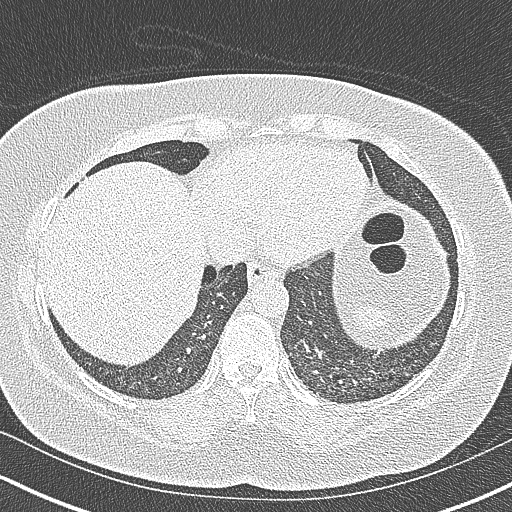
[im 119/285  mediastinal]
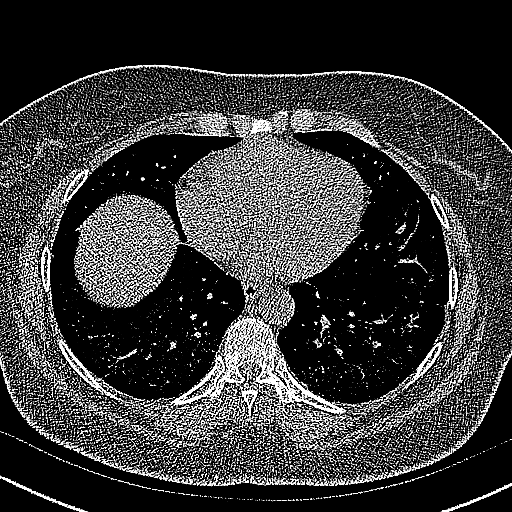
[im 119/285  lung]
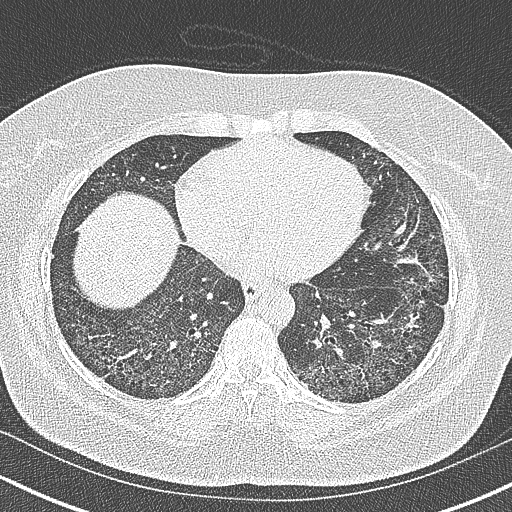
[im 143/285  lung]
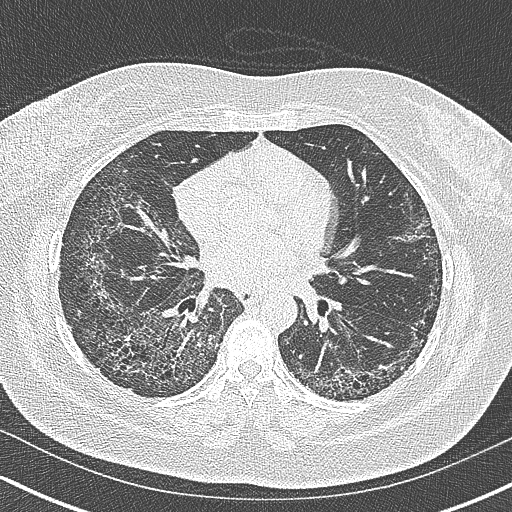
[im 166/285  lung]
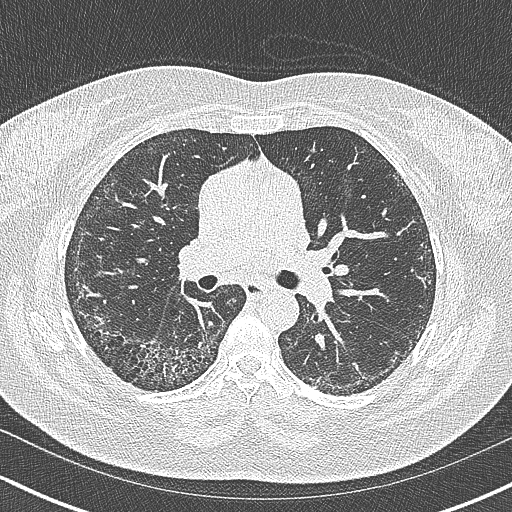
[im 190/285  lung]
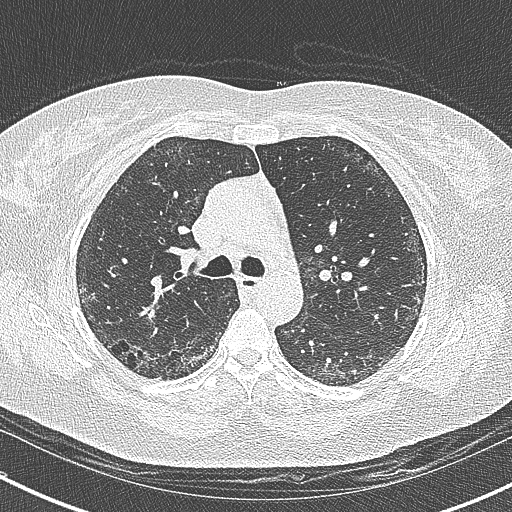
[im 214/285  mediastinal]
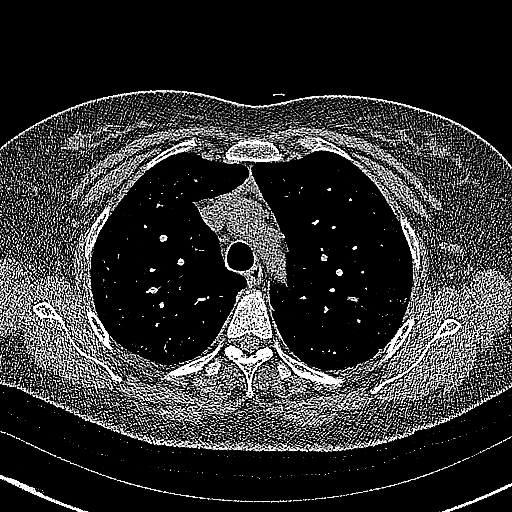
[im 214/285  lung]
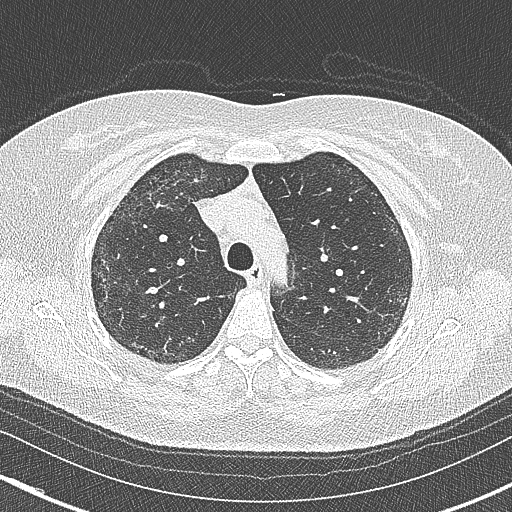
[im 237/285  lung]
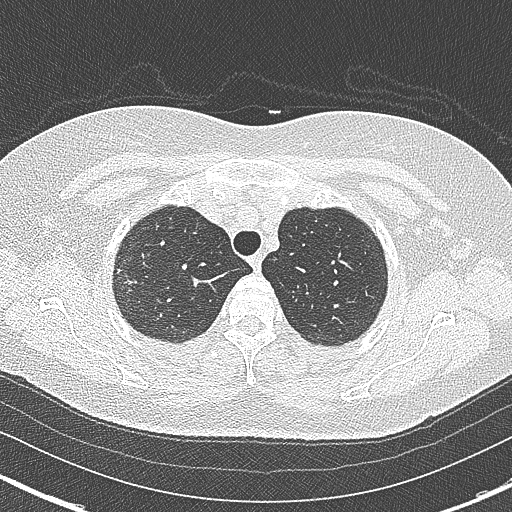
[im 261/285  lung]
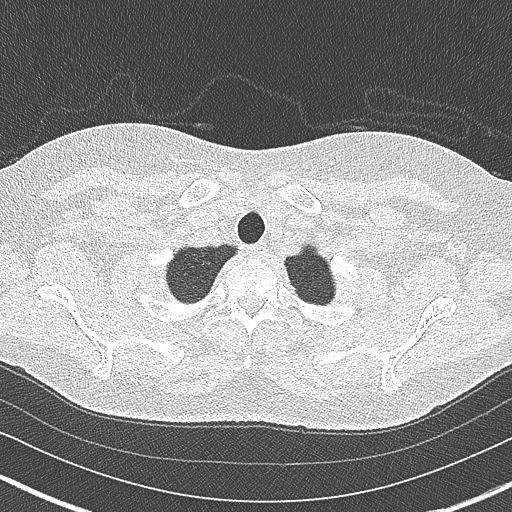

[Series 6: coronal · coronal · 0.59mm/px · 3 of 115 slices shown]
[im 23/115  lung]
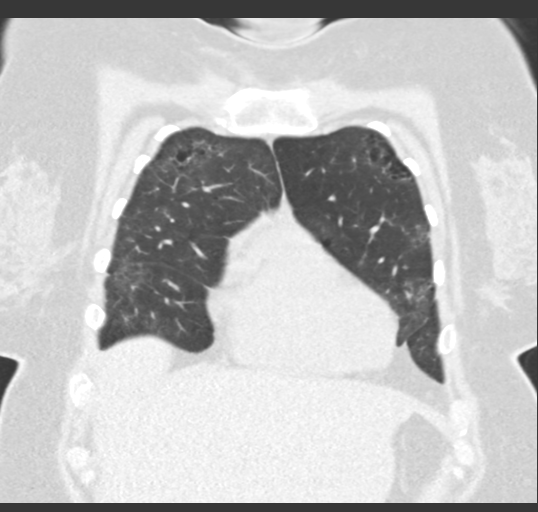
[im 46/115  lung]
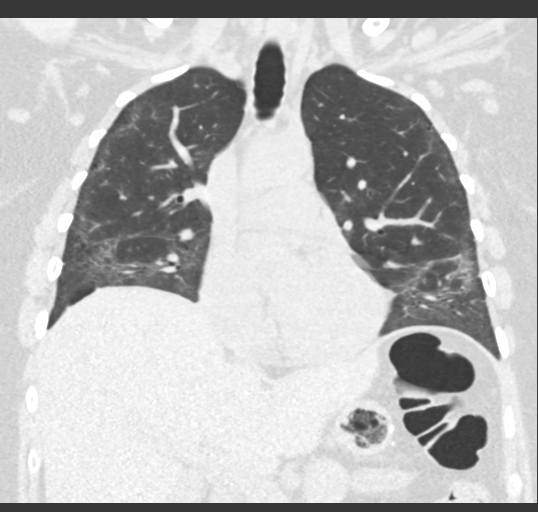
[im 69/115  lung]
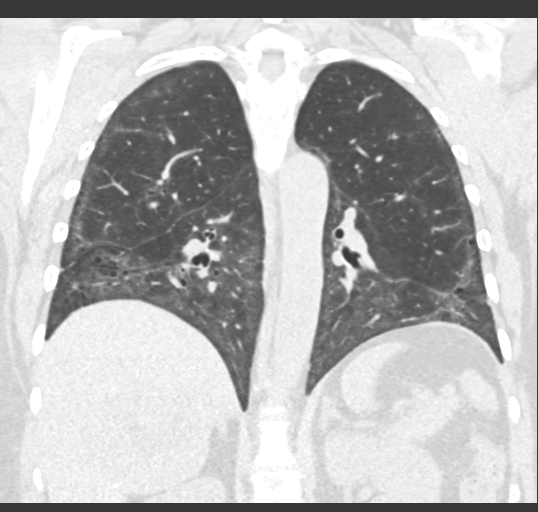

[14 of 36 positions shown; findings below may reference images not displayed]

FINDINGS: Cardiovascular: Heart size is normal. There is no significant
pericardial fluid, thickening or pericardial calcification. Aortic
atherosclerosis. No definite coronary artery calcifications.

Mediastinum/Nodes: No pathologically enlarged mediastinal or hilar
lymph nodes. Small amount of amorphous soft tissue in the anterior
mediastinum which may be of thymic origin, with a generally benign
appearance, likely a small thymoma. Esophagus is unremarkable in
appearance. No axillary lymphadenopathy.

Lungs/Pleura: High-resolution images demonstrate widespread areas of
ground-glass attenuation, septal thickening, cylindrical
bronchiectasis, peripheral bronchiolectasis, and multifocal cystic
areas scattered throughout the periphery of the lungs bilaterally.
These findings have a definitive craniocaudal gradient. Within the
areas of greatest involvement, there are generally spared areas of
subpleural interstitium. No frank honeycombing confidently
identified. Inspiratory and expiratory imaging for is unremarkable.
No acute consolidative airspace disease. No pleural effusions. No
definite suspicious appearing pulmonary nodules or masses are noted.

Upper Abdomen: Large heterogeneous low-attenuation mass in the
posterior aspect of the right lobe of the liver which distorts the
liver contour poorly demonstrated on today's noncontrast CT
examination, but estimated to measure approximately 8.9 x 7.2 x
cm (axial image 120 of series 2 and coronal image 75 of series 6).

Musculoskeletal: There are no aggressive appearing lytic or blastic
lesions noted in the visualized portions of the skeleton.
IMPRESSION: 1. The appearance of the lung is compatible with interstitial lung
disease. There is an unusual spectrum of findings. Given the strong
craniocaudal gradient and presence of areas of bronchiectasis, the
possibility of the UIP (usual interstitial pneumonia) is not
excluded. However, the immediate subpleural interstitium is most
commonly spared in this particular individual, which would be highly
unusual for usual interstitial pneumonia, and there is no
honeycombing at this time. Overall, this is technically categorized
as probable usual interstitial pneumonia (UIP) per current ATS
guidelines, however, alternative diagnoses such as fibrotic phase
nonspecific interstitial pneumonia (NSIP) is more strongly favored.
Repeat high-resolution chest CT is recommended in 12 months to
assess for temporal changes in the appearance of the lung
parenchyma.
2. Small amount of amorphous soft tissue in the anterior mediastinum
which is likely of thymic origin, potentially a small thymoma.
Clinical correlation for signs and symptoms of myasthenia gravis is
recommended.
3. Large poorly defined low-attenuation mass extending from the
posterior aspect of the right lobe of the liver highly concerning
for neoplasm. The possibility of a benign etiology such as a giant
cavernous hemangioma is also considered, but is a diagnosis of
exclusion. This is poorly evaluated on today's noncontrast CT
examination. Further evaluation with follow-up nonemergent abdominal
MRI with and without IV gadolinium is strongly recommended in the
near future to exclude neoplasm.
4. Aortic atherosclerosis.

These results will be called to the ordering clinician or
representative by the Radiologist Assistant, and communication
documented in the PACS or [REDACTED].

Aortic Atherosclerosis ([OO]-[OO]).

## 2021-09-15 ENCOUNTER — Telehealth: Payer: Self-pay | Admitting: Pulmonary Disease

## 2021-09-15 DIAGNOSIS — K769 Liver disease, unspecified: Secondary | ICD-10-CM

## 2021-09-15 NOTE — Telephone Encounter (Signed)
The MRI tech wanted to let you know to see the recommendation in part 3 of the impression. FYI to the provider for the CT.

## 2021-09-16 NOTE — Telephone Encounter (Signed)
Dr. Isaiah Serge, just to clarify, do you want MRI with with or without contrast?

## 2021-09-16 NOTE — Telephone Encounter (Signed)
I called and discussed with patient Please order MRI liver for evaluation of liver mass

## 2021-09-17 NOTE — Telephone Encounter (Signed)
Order placed. Nothing further needed at this time. 

## 2021-09-17 NOTE — Telephone Encounter (Signed)
Please order MRI abdomen with and without IV gadolinium for evaluation of liver mass

## 2021-10-13 ENCOUNTER — Ambulatory Visit
Admission: RE | Admit: 2021-10-13 | Discharge: 2021-10-13 | Disposition: A | Discharge status: Home / Self Care | Payer: No Typology Code available for payment source | Source: Ambulatory Visit | Attending: Pulmonary Disease | Admitting: Pulmonary Disease

## 2021-10-13 ENCOUNTER — Other Ambulatory Visit: Payer: Self-pay

## 2021-10-13 DIAGNOSIS — K769 Liver disease, unspecified: Secondary | ICD-10-CM

## 2021-10-13 IMAGING — MR MR ABDOMEN WO/W CM
11 of 17 series · 25 of 48 positions shown · IV contrast (multihance)
Comparison: CT [DATE]

CLINICAL DATA: Further characterization of hepatic lesions seen on
prior chest CT.

EXAM:
MRI ABDOMEN WITHOUT AND WITH CONTRAST
TECHNIQUE: Multiplanar multisequence MR imaging of the abdomen was performed
both before and after the administration of intravenous contrast.
CONTRAST:  19mL MULTIHANCE GADOBENATE DIMEGLUMINE 529 MG/ML IV SOLN

[Series 3: T2 · coronal · 5.0mm · 1.56mm/px · 1 of 30 slices shown (1 of 3)]
[im 1/30]
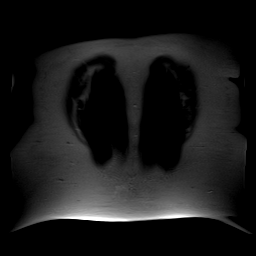

[Series 4: axial tru fisp · axial · 5.5mm · 1.41mm/px · 1 of 42 slices shown]
[im 1/42]
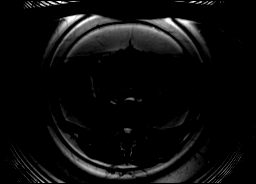

[Series 5: [id number] · axial · 6.0mm · 1.98mm/px · z∈[-87,+202]mm · 3 of 114 slices shown]
[im 1/114]
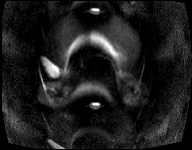
[im 57/114]
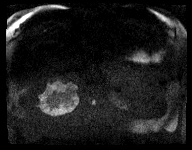
[im 114/114]
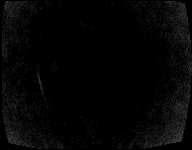

[Series 6: [id number]_adc · axial · 6.0mm · 1.98mm/px · 1 of 38 slices shown]
[im 1/38]
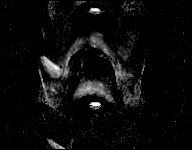

[Series 7: T2 · axial · 6.5mm · 0.70mm/px · 1 of 36 slices shown (2 of 3)]
[im 1/36]
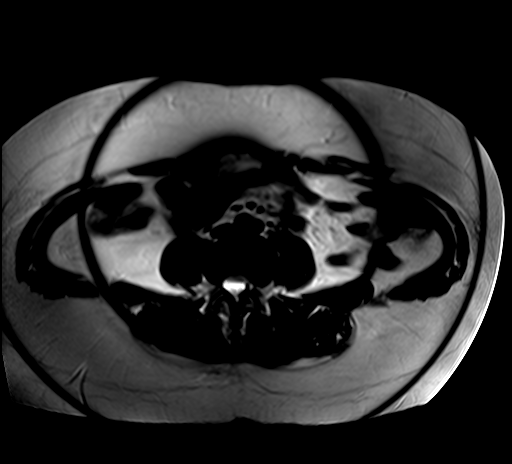

[Series 8: T2 · axial · 5.0mm · 1.37mm/px · 1 of 40 slices shown (3 of 3)]
[im 1/40]
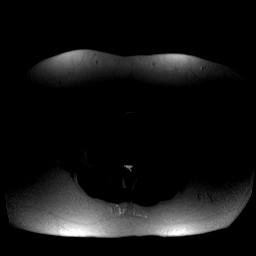

[Series 9: axial in out · axial · 6.0mm · 0.70mm/px · z∈[-110,+138]mm · 2 of 74 slices shown]
[im 1/74]
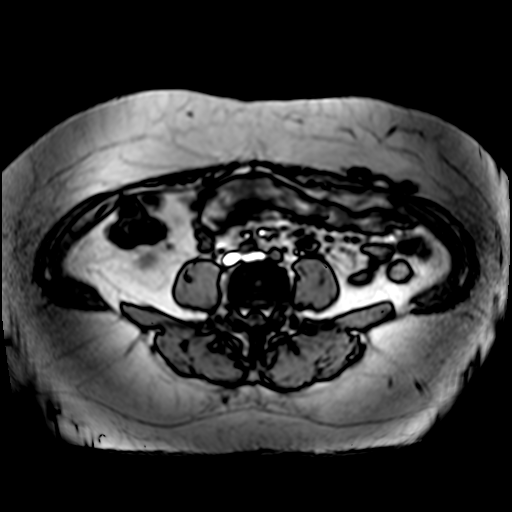
[im 74/74]
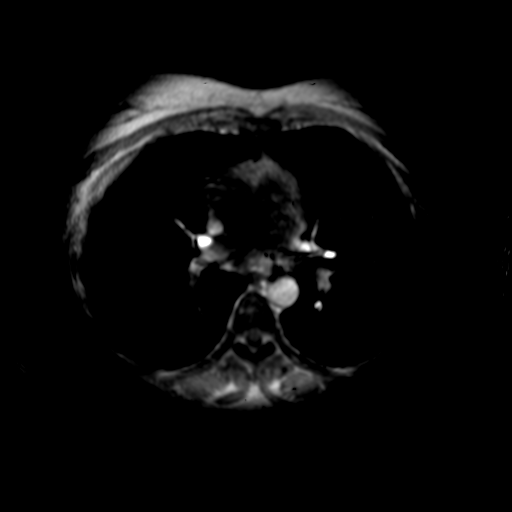

[Series 10: T1 dynamic · axial · non-contrast · 2.4mm · 0.74mm/px · z∈[-102,+126]mm · 3 of 96 slices shown]
[im 1/96]
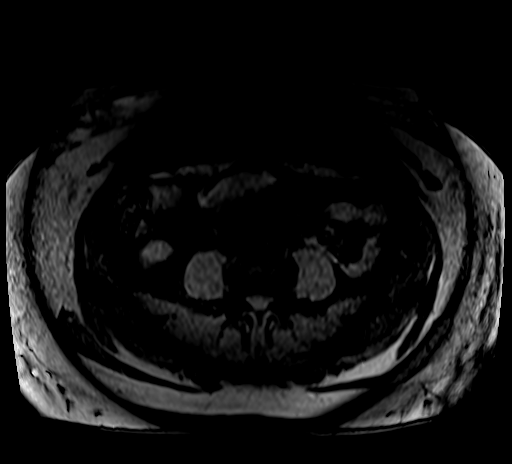
[im 48/96]
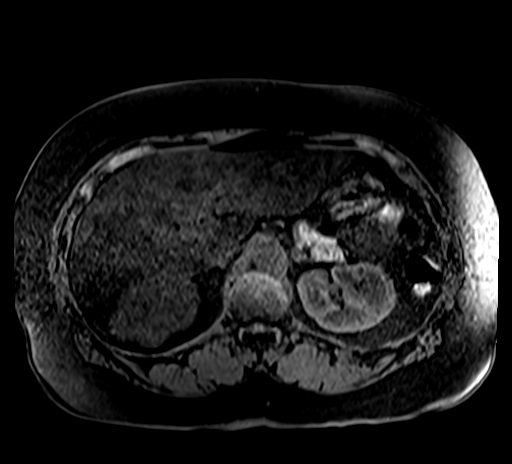
[im 96/96]
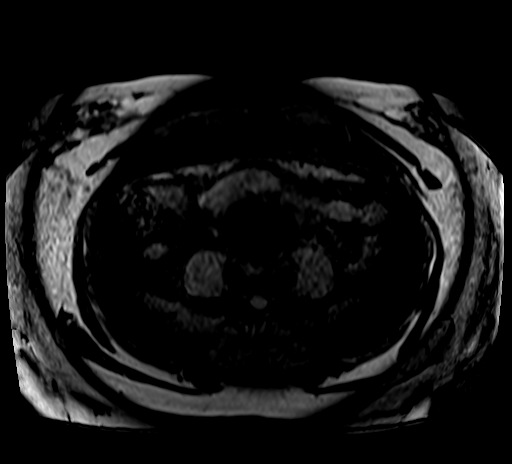

[Series 11: post 25 sec · axial · 2.4mm · 0.74mm/px · z∈[-102,+126]mm · 4 of 96 slices shown]
[im 1/96]
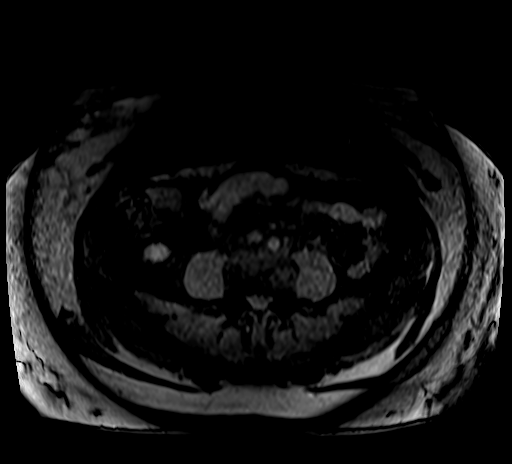
[im 32/96]
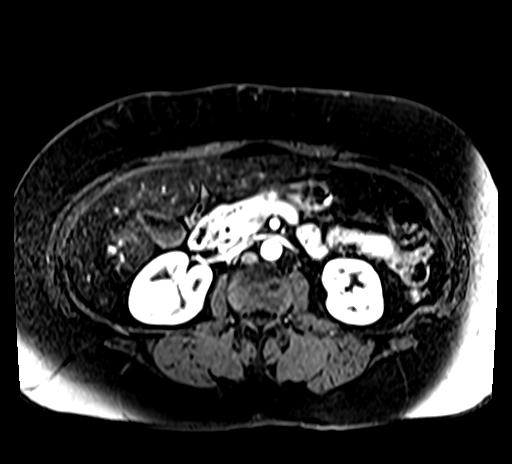
[im 64/96]
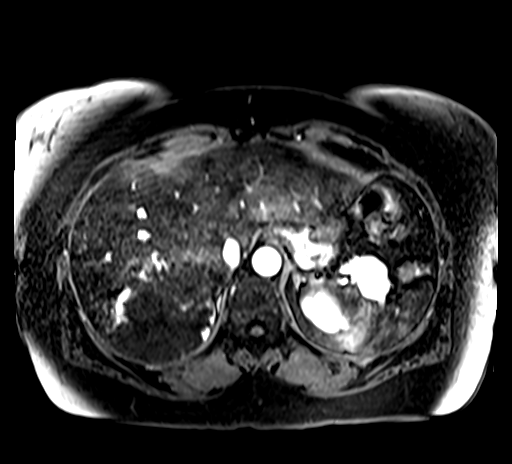
[im 96/96]
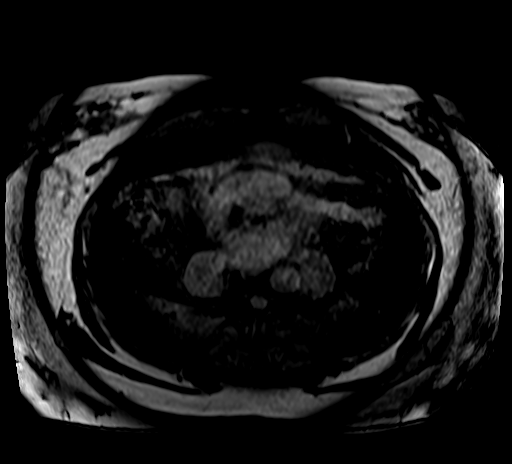

[Series 12: post 25 sec_sub · axial · 2.4mm · 0.74mm/px · z∈[-102,+126]mm · 4 of 96 slices shown]
[im 1/96]
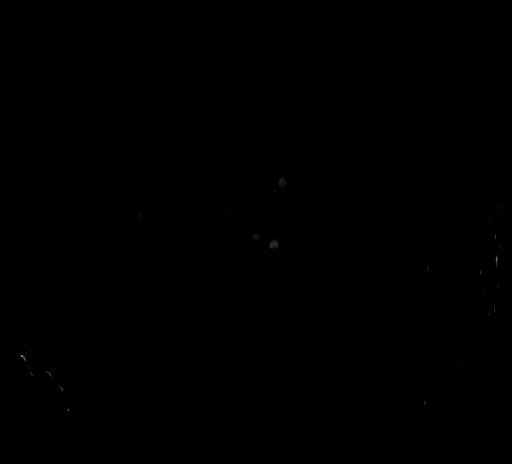
[im 32/96]
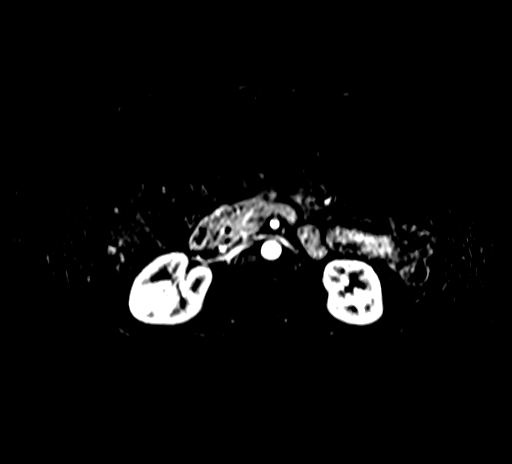
[im 64/96]
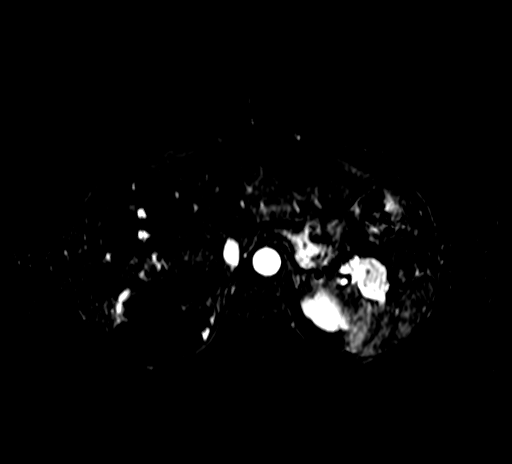
[im 96/96]
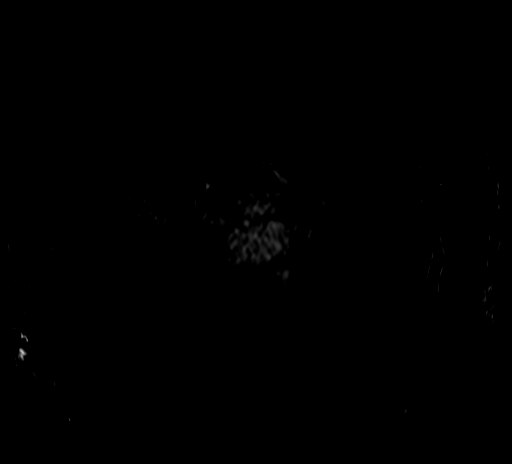

[Series 13: post 45 sec · axial · 2.4mm · 0.74mm/px · z∈[-102,+126]mm · 4 of 96 slices shown]
[im 1/96]
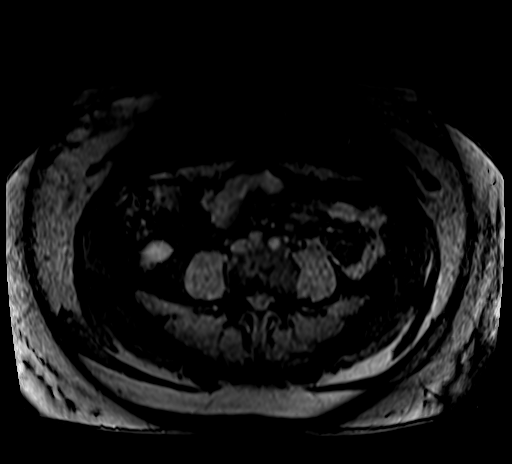
[im 32/96]
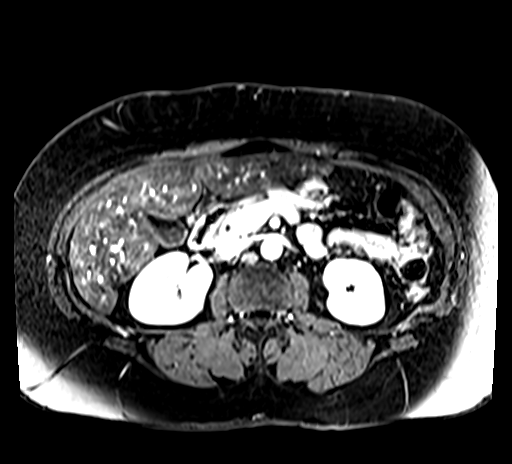
[im 64/96]
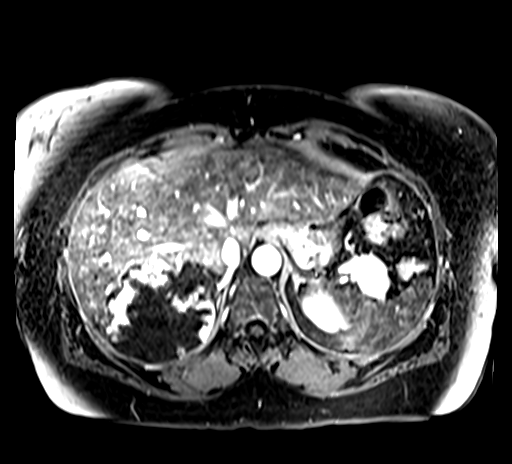
[im 96/96]
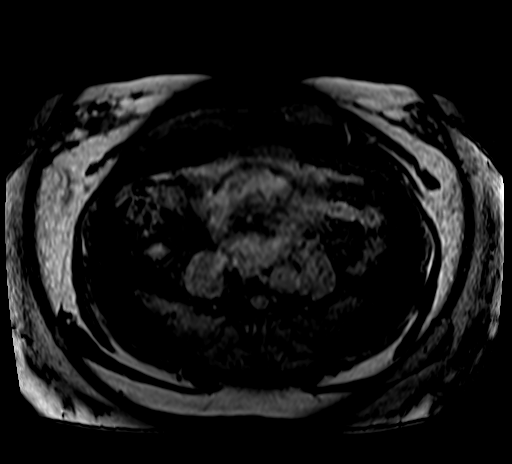

[25 of 48 positions shown; findings below may reference images not displayed]

FINDINGS: Lower chest: No acute abnormality.

Hepatobiliary: Loss of intensity throughout the hepatic parenchyma
on inphase imaging consistent with iron deposition.

Lobular lesion in the right lobe of the liver measures 8.7 x 7.5 cm
on image [DATE] and demonstrates intermediate hyperintense peripheral
T2 signal with more hyperintense central stellate T2 signal, and
intrinsic hypointense T1 signal. There is peripheral nodular
discontinuous enhancement on arterial phase with more filling in of
the lesion with contrast similar in intensity to aorta on delayed
imaging, most consistent with a partially hyalinized benign hepatic
hemangioma.

T2 hyperintense 17 mm lesion in the inferior right lobe of the liver
which demonstrates peripheral nodular discontinuous enhancement
which fills in on delayed imaging and similar in intensity to aorta
on image 61/18.

Additional 19 mm lesion in the superior aspect of the right lobe of
the liver adjacent to the larger hepatic lesion with similar imaging
characteristics on image [DATE]. Both of these are consistent with
benign hepatic hemangiomas.

Gallbladder is unremarkable.  No biliary ductal dilation.

Pancreas: Intrinsic T1 signal of the pancreatic parenchyma is within
normal limits. No pancreatic ductal dilation. No cystic or solid
hyperenhancing pancreatic lesion identified.

Spleen:  Normal size spleen.

Adrenals/Urinary Tract: Bilateral adrenal glands are unremarkable.
No hydronephrosis. No solid enhancing renal mass.

Stomach/Bowel: Visualized portions within the abdomen are
unremarkable.

Vascular/Lymphatic: No pathologically enlarged lymph nodes
identified. No abdominal aortic aneurysm demonstrated.

Other:  No abdominal aortic aneurysm.

Musculoskeletal: No suspicious bone lesions identified.
IMPRESSION: 1. Three benign hepatic hemangiomas the largest of which measures
8.7 cm and corresponds with the lesion seen on prior chest CT. No
suspicious hepatic lesion.
2. Hepatic iron deposition.

## 2021-10-13 MED ORDER — GADOBENATE DIMEGLUMINE 529 MG/ML IV SOLN
19.0000 mL | Freq: Once | INTRAVENOUS | Status: AC | PRN
  Administered 2021-10-13: 19 mL via INTRAVENOUS

## 2021-10-27 ENCOUNTER — Telehealth: Payer: Self-pay | Admitting: Genetic Counselor

## 2021-10-27 NOTE — Telephone Encounter (Signed)
Scheduled appt per 12/21 referral. Pt is aware of appt date and time.  °

## 2021-11-10 ENCOUNTER — Other Ambulatory Visit: Payer: Self-pay

## 2021-11-10 ENCOUNTER — Encounter: Payer: Self-pay | Admitting: Pulmonary Disease

## 2021-11-10 ENCOUNTER — Ambulatory Visit (INDEPENDENT_AMBULATORY_CARE_PROVIDER_SITE_OTHER): Payer: No Typology Code available for payment source | Admitting: Pulmonary Disease

## 2021-11-10 VITALS — BP 124/76 | HR 86 | Temp 98.0°F | Ht 67.0 in | Wt 211.6 lb

## 2021-11-10 DIAGNOSIS — J8410 Pulmonary fibrosis, unspecified: Secondary | ICD-10-CM | POA: Diagnosis not present

## 2021-11-10 DIAGNOSIS — J849 Interstitial pulmonary disease, unspecified: Secondary | ICD-10-CM | POA: Diagnosis not present

## 2021-11-10 DIAGNOSIS — Z5181 Encounter for therapeutic drug level monitoring: Secondary | ICD-10-CM | POA: Diagnosis not present

## 2021-11-10 LAB — PULMONARY FUNCTION TEST
DL/VA % pred: 75 %
DL/VA: 3.18 ml/min/mmHg/L
DLCO cor % pred: 48 %
DLCO cor: 11.22 ml/min/mmHg
DLCO unc % pred: 48 %
DLCO unc: 11.22 ml/min/mmHg
FEF 25-75 Post: 2.42 L/sec
FEF 25-75 Pre: 2.08 L/sec
FEF2575-%Change-Post: 16 %
FEF2575-%Pred-Post: 90 %
FEF2575-%Pred-Pre: 77 %
FEV1-%Change-Post: 3 %
FEV1-%Pred-Post: 84 %
FEV1-%Pred-Pre: 81 %
FEV1-Post: 2.2 L
FEV1-Pre: 2.12 L
FEV1FVC-%Change-Post: 6 %
FEV1FVC-%Pred-Pre: 99 %
FEV6-%Change-Post: -2 %
FEV6-%Pred-Post: 80 %
FEV6-%Pred-Pre: 82 %
FEV6-Post: 2.55 L
FEV6-Pre: 2.61 L
FEV6FVC-%Pred-Post: 102 %
FEV6FVC-%Pred-Pre: 102 %
FVC-%Change-Post: -2 %
FVC-%Pred-Post: 78 %
FVC-%Pred-Pre: 81 %
FVC-Post: 2.55 L
FVC-Pre: 2.63 L
Post FEV1/FVC ratio: 86 %
Post FEV6/FVC ratio: 100 %
Pre FEV1/FVC ratio: 81 %
Pre FEV6/FVC Ratio: 100 %
RV % pred: 52 %
RV: 1.02 L
TLC % pred: 68 %
TLC: 3.75 L

## 2021-11-10 NOTE — Patient Instructions (Signed)
We will check a CBC today to monitor your blood counts If they are okay then we may go up on the CellCept Will renew your Bactrim and Protonix  Follow-up in 3 months

## 2021-11-10 NOTE — Progress Notes (Addendum)
Chelsea Jennings    [id number]    February 15, 1971  Primary Care Physician:Hicks, Phylliss Bob, MD  Referring Physician: [name], MD [place],  [place] [address]  Chief complaint: Follow-up for CTD related interstitial lung disease, NSIP  HPI: 51 year old with history of hypertension, unspecified connective tissue disease, NSIP interstitial lung disease here to establish care She was previously followed by Dr.  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  She was put on prednisone 40 mg and then CellCept which is titrated up to 1 g twice daily.  She was able to be weaned off prednisone in December 2021 with improvement in symptoms of dyspnea. She also follows with Dr. Lavonna Monarch, rheumatology at the [facility] and is on Plaquenil. She completed pulmonary rehab in 2021  States that her dyspnea on exertion has been stable on CellCept.  She is also on Bactrim for prophylaxis. Had echocardiogram in 2021 at the [place] with no evidence of pulmonary hypertension  She also has history of GERD and was on omeprazole but developed side effects of rash.  She is currently on pantoprazole 20 mg a day which appears to control her symptoms Postnasal drip was treated with over-the-counter antihistamines and nasal spray.  She was on chlorpheniramine which at least helped with her anxiety.   Pets: She is to have cats in the past.  No pets currently Occupation: Works for [employer].  Previously in the [employer] Exposures: Exposed to chemicals at [employer] in [place] Smoking history: Never smoker Travel history: No significant recent travel Relevant family history:No significant family history of lung disease  Outpatient Encounter Medications as of 11/10/2021  Medication Sig   amLODipine (NORVASC) 5 MG tablet Take 5 mg by mouth daily.   chlorpheniramine (CHLOR-TRIMETON) 4 MG tablet Take 4 mg by mouth 2 (two) times  daily as needed for allergies.   clobetasol ointment (TEMOVATE) [id number] % Apply 1 application topically 2 (two) times daily.   dorzolamide-timolol (COSOPT) 22.3-6.8 MG/ML ophthalmic solution 1 drop 2 (two) times daily.   hydroxychloroquine (PLAQUENIL) 200 MG tablet Take 200 mg by mouth 2 (two) times daily.   latanoprost (XALATAN) 0.005 % ophthalmic solution Place 1 drop into both eyes at bedtime.   mycophenolate (CELLCEPT) 500 MG tablet Take 500 mg by mouth 2 (two) times daily.   predniSONE (DELTASONE) 5 MG tablet Take by mouth.   sulfamethoxazole-trimethoprim (BACTRIM) 200-40 MG/5ML suspension Take 160 mg by mouth once. 1 tablet Monday, Wednesday, and Friday   cetirizine (ZYRTEC) 10 MG chewable tablet Chew 10 mg by mouth daily.   omeprazole (PRILOSEC) 40 MG capsule Take 40 mg by mouth daily.   No facility-administered encounter medications on file as of 11/10/2021.    Physical Exam: Blood pressure 124/76, pulse 86, temperature 98 F (36.7 C), temperature source Oral, height 5\' 7"  (1.702 m), weight 211 lb 9.6 oz (96 kg), SpO2 97 %. Gen:      No acute distress HEENT:  EOMI, sclera anicteric Neck:     No masses; no thyromegaly Lungs:    Clear to auscultation bilaterally; normal respiratory effort CV:         Regular rate and rhythm; no murmurs Abd:      + bowel sounds; soft, non-tender; no palpable masses, no distension Ext:    No edema; adequate peripheral perfusion Skin:      Warm and  dry; no rash Neuro: alert and oriented x 3 Psych: normal mood and affect   Data Reviewed: Reviewed data from the [place] PFTs 2015-FVC 3.05 [79%], TLC 4.39 [80%], DLCO 65% 02/2020-FVC 2.47 [72%], TLC 3.39 [60%], DLCO 42%  CT chest 03/2020-baseline ILD with subpleural sparing, groundglass opacities with traction bronchiectasis.  Cysts versus honeycombing at the base.  Worsening compared to 2019  Echocardiogram May 2021-no pulmonary hypertension, positive for diastolic dysfunction  Imaging: High-res CT  09/14/2021-probable UIP pattern interstitial pneumonia, soft tissue mass in the anterior mediastinum, low-attenuation lesion in the liver.  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  She is off prednisone since 2021 Continues on Bactrim for prophylaxis  I will get records from her rheumatologist at the [facility] CT reviewed with probable UIP pattern scarring.  She has had a thymic mass but no evidence of myasthenia gravis Continue CellCept Bactrim and Plaquenil for now Recheck CBC and consider increasing CellCept to 1.5 g twice daily If there is progression then consider antifibrotics.  She may be a good transplant candidate as well.  Chronic cough Felt to be multifactorial from interstitial lung disease, GERD, postnasal drip Continue PPI, over-the-counter antihistamine  Liver lesions Noted on high-res CT Follow-up MRI shows benign hemangiomas  Plan/Recommendations: Continue CellCept, Bactrim, Plaquenil Check CBC Obtain records from rheumatology   Marshell Garfinkel MD [facility] 11/10/2021, 11:21 AM  CC: [name], MD

## 2021-11-10 NOTE — Progress Notes (Signed)
PFT done today. 

## 2021-11-10 NOTE — Addendum Note (Signed)
Addended by: Suzzanne Cloud E on: 11/10/2021 04:56 PM   Modules accepted: Orders

## 2021-11-15 ENCOUNTER — Telehealth: Payer: Self-pay | Admitting: Genetic Counselor

## 2021-11-15 ENCOUNTER — Inpatient Hospital Stay: Payer: No Typology Code available for payment source | Admitting: Genetic Counselor

## 2021-11-15 ENCOUNTER — Inpatient Hospital Stay: Payer: No Typology Code available for payment source

## 2021-11-15 NOTE — Telephone Encounter (Signed)
Patient needed to reschedule genetics appt due to COVID.  Rescheduled for 1/30.

## 2021-11-30 ENCOUNTER — Other Ambulatory Visit: Payer: Self-pay

## 2021-11-30 ENCOUNTER — Other Ambulatory Visit: Payer: Self-pay | Admitting: Surgery

## 2021-11-30 ENCOUNTER — Encounter: Payer: Self-pay | Admitting: Adult Health

## 2021-11-30 ENCOUNTER — Ambulatory Visit (INDEPENDENT_AMBULATORY_CARE_PROVIDER_SITE_OTHER): Payer: No Typology Code available for payment source | Admitting: Adult Health

## 2021-11-30 ENCOUNTER — Ambulatory Visit (INDEPENDENT_AMBULATORY_CARE_PROVIDER_SITE_OTHER): Payer: No Typology Code available for payment source

## 2021-11-30 VITALS — BP 110/70 | HR 104 | Temp 98.5°F | Ht 67.0 in | Wt 209.0 lb

## 2021-11-30 DIAGNOSIS — J1282 Pneumonia due to coronavirus disease 2019: Secondary | ICD-10-CM

## 2021-11-30 DIAGNOSIS — U071 COVID-19: Secondary | ICD-10-CM

## 2021-11-30 DIAGNOSIS — R9389 Abnormal findings on diagnostic imaging of other specified body structures: Secondary | ICD-10-CM

## 2021-11-30 DIAGNOSIS — J208 Acute bronchitis due to other specified organisms: Secondary | ICD-10-CM

## 2021-11-30 DIAGNOSIS — J849 Interstitial pulmonary disease, unspecified: Secondary | ICD-10-CM

## 2021-11-30 DIAGNOSIS — J209 Acute bronchitis, unspecified: Secondary | ICD-10-CM | POA: Insufficient documentation

## 2021-11-30 IMAGING — DX DG CHEST 2V
2 series · 2 of 2 positions shown · non-contrast
Comparison: CT of the chest [DATE] done for chronic shortness
of breath.

CLINICAL DATA: Follow-up coronavirus pneumonia. Also history of
nonspecific interstitial pneumonia.

EXAM:
CHEST - 2 VIEW

[chest pa]
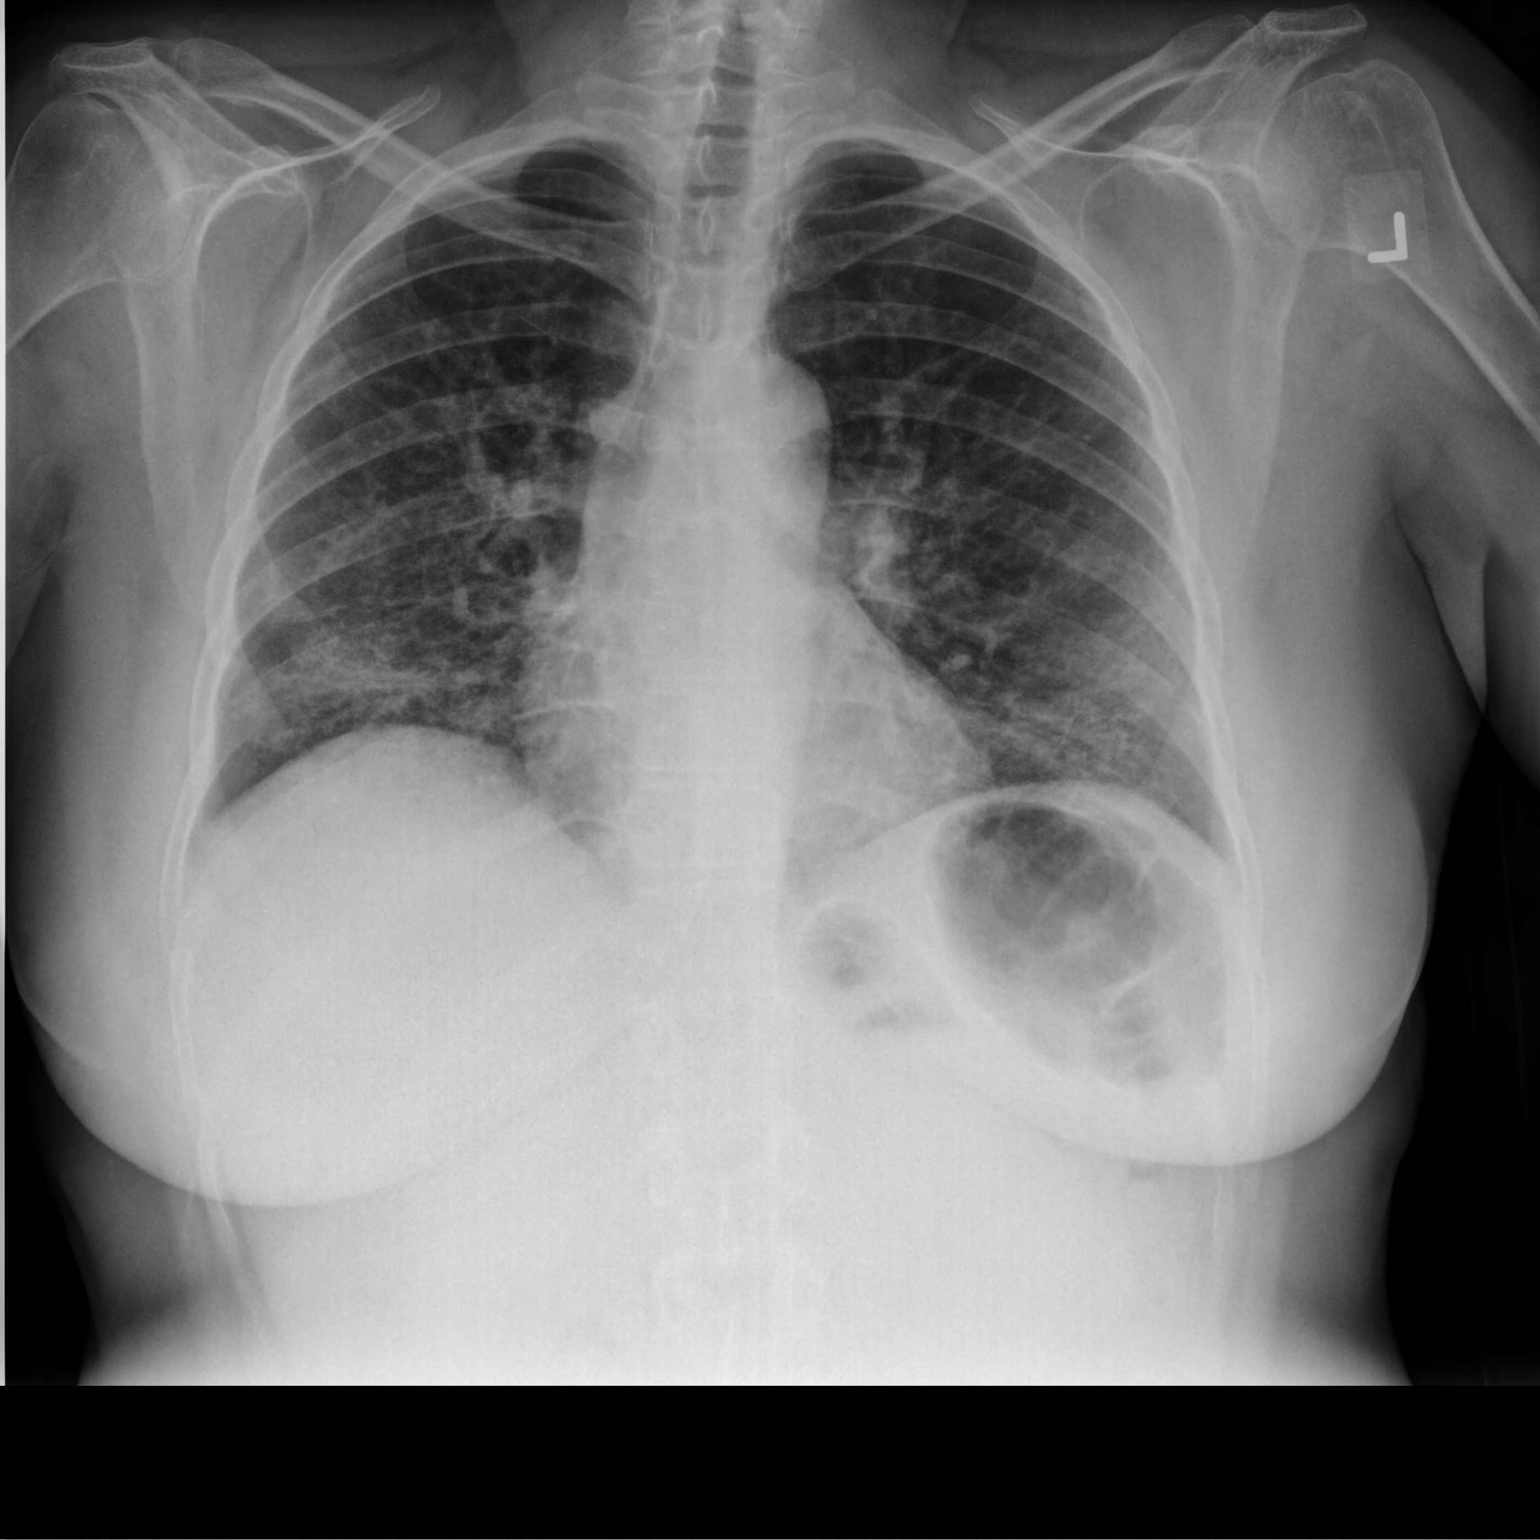

[chest lat]
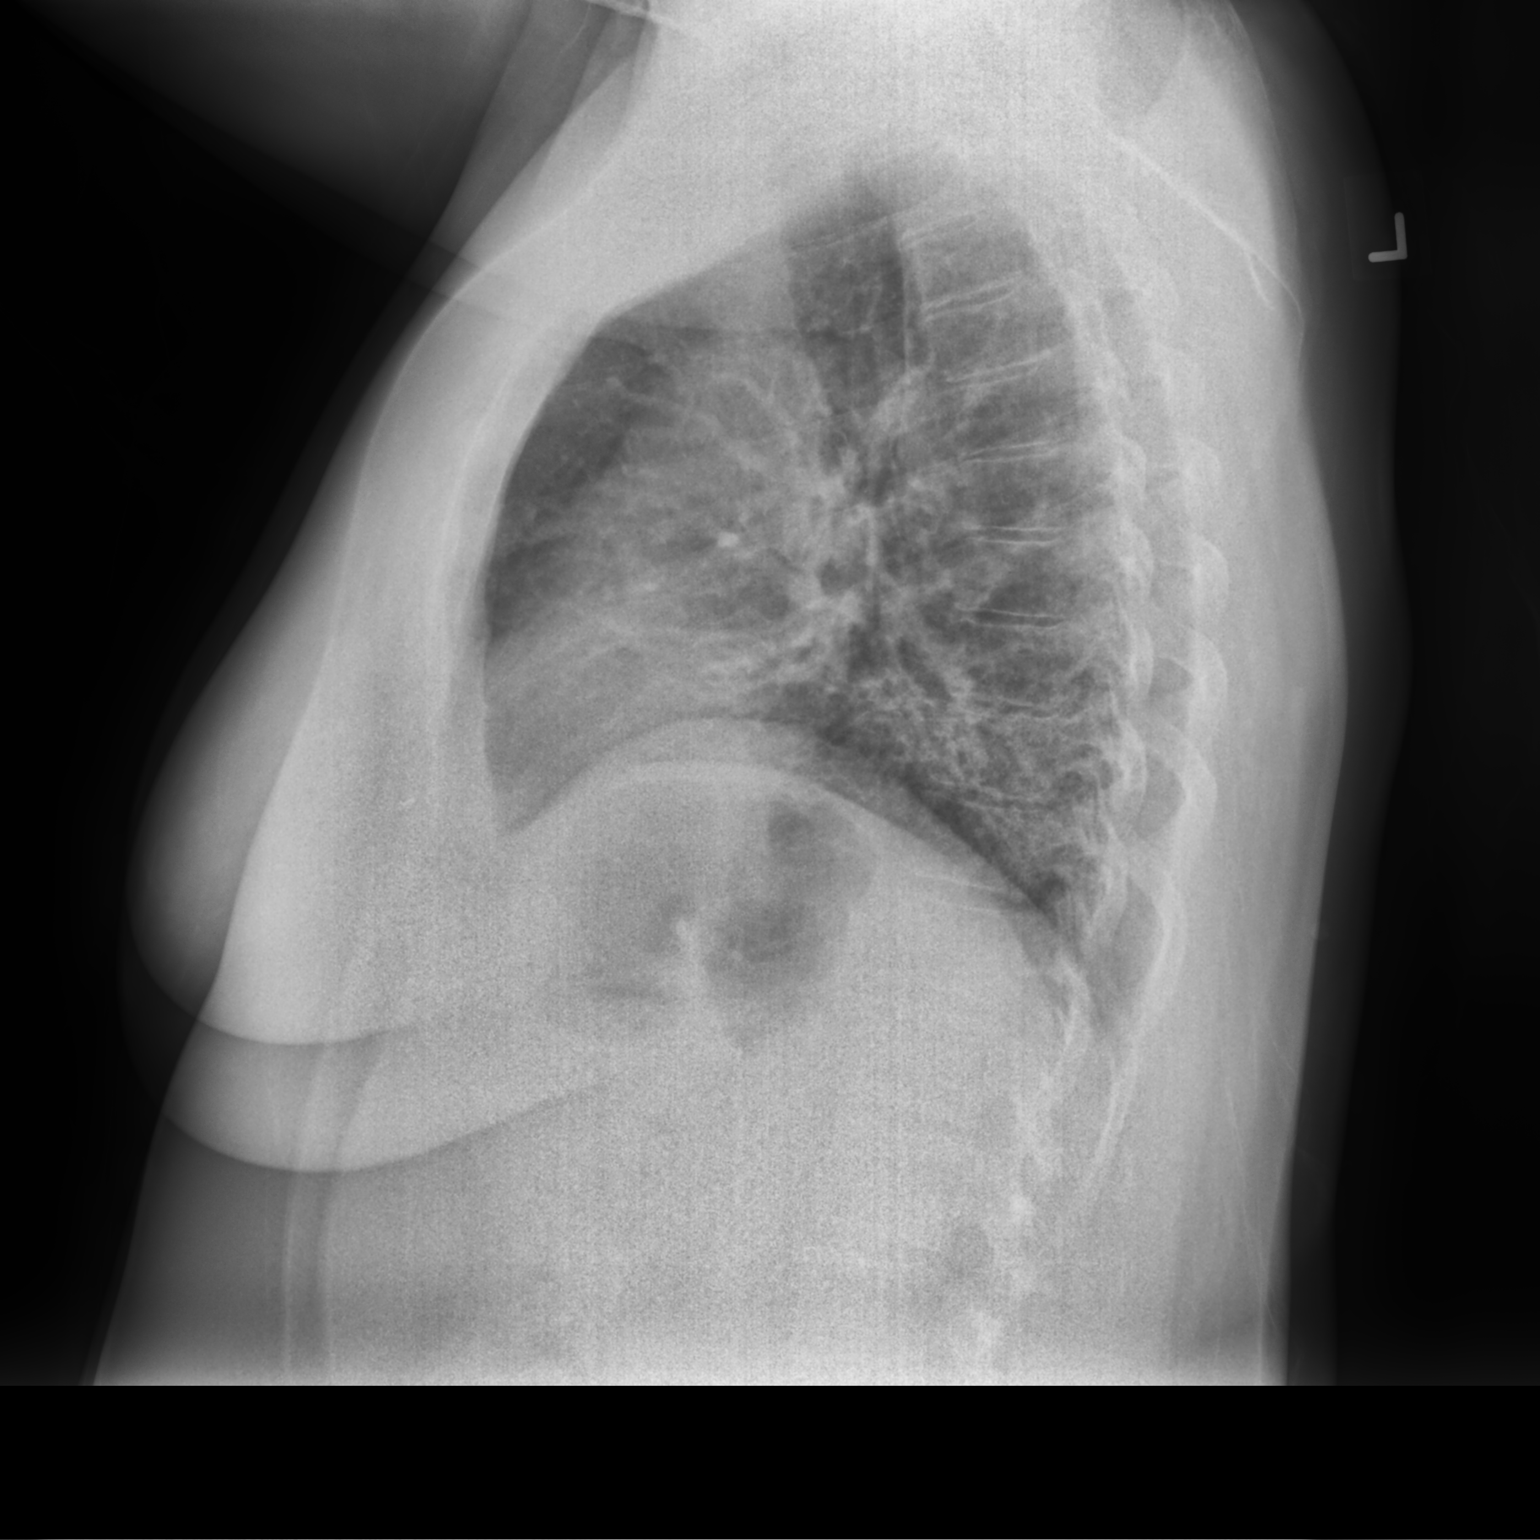

[2 of 2 positions shown; findings below may reference images not displayed]

FINDINGS: Heart size is normal. Mediastinal shadows are normal. There are
widespread patchy bilateral pulmonary opacities most pronounced in
the lower lobes with some mild emphysematous changes in the
periphery of the lungs, better shown by CT. These findings are
consistent with chronic interstitial lung disease. There is no prior
chest radiograph with which to compare, from any time where acute
pneumonia was described. Therefore, it is certainly possible that
some of these patchy densities could relate to previous or even
ongoing coronavirus pneumonia, but similarly [REDACTED] simply reflect
the chronic lung disease shown by CT. No dense consolidation or
collapse. No effusion.
IMPRESSION: Widespread patchy bilateral pulmonary opacities, lower lung
predominant, consistent with the chronic interstitial lung disease
as shown by prior CT. There is no prior chest radiograph with which
to compare. I cannot rule out some element of acute or subacute
pneumonia.

## 2021-11-30 MED ORDER — HYDROCODONE BIT-HOMATROP MBR 5-1.5 MG/5ML PO SOLN: 5.0000 mL | Freq: Every evening | ORAL | 0 refills | Status: DC | PRN

## 2021-11-30 MED ORDER — BENZONATATE 200 MG PO CAPS: 200.0000 mg | ORAL_CAPSULE | Freq: Three times a day (TID) | ORAL | 2 refills | Status: AC | PRN

## 2021-11-30 MED ORDER — PREDNISONE 10 MG PO TABS: ORAL_TABLET | ORAL | 0 refills | Status: DC

## 2021-11-30 MED ORDER — AZITHROMYCIN 250 MG PO TABS: ORAL_TABLET | ORAL | 0 refills | Status: AC

## 2021-11-30 NOTE — Assessment & Plan Note (Signed)
ILD related ILD related to connective tissue disorder-appears stable normal O2 saturations on room air. Patient is continue on her current immunosuppression regimen. Patient is to return for her upcoming labs in 2 weeks as discussed.

## 2021-11-30 NOTE — Assessment & Plan Note (Signed)
Acute bronchitis.  We will treat with empiric antibiotics and steroids.  Plan  Patient Instructions  Zpack take as directed.  Prednisone taper  Liquid Mucinex DM Twice daily  As needed  cough/congestion  Tessalon Three times a day  for cough/congestion  Hydromet 1 tsp .At bedtime  for severe cough As needed  , may make you sleepy.  Follow up with Dr. Isaiah Serge as planned , return for your planned labs in 2 weeks.  Please contact office for sooner follow up if symptoms do not improve or worsen or seek emergency care

## 2021-11-30 NOTE — Patient Instructions (Addendum)
Zpack take as directed.  Prednisone taper  Liquid Mucinex DM Twice daily  As needed  cough/congestion  Tessalon Three times a day  for cough/congestion  Hydromet 1 tsp .At bedtime  for severe cough As needed  , may make you sleepy.  Follow up with Dr. Isaiah Serge as planned , return for your planned labs in 2 weeks.  Please contact office for sooner follow up if symptoms do not improve or worsen or seek emergency care

## 2021-11-30 NOTE — Progress Notes (Signed)
@Patient  ID: Chelsea Jennings, female    DOB: 09-20-1971, 51 y.o.   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  Currently on Plaquenil and Cellcept , on Bactrim for prophylaxis  Completed pulmonary rehab in 2021 2D echo 2021 with no evidence of pulmonary hypertension  TEST/EVENTS :  ILD:  Pets: She is to have cats in the past.  No pets currently Occupation: Works for [employer].  Previously in the [employer] Exposures: Exposed to chemicals at [employer] in [place] Smoking history: Never smoker Travel history: No significant recent travel Relevant family history:No significant family history of lung disease  PFTs 2015-FVC 3.05 [79%], TLC 4.39 [80%], DLCO 65% 02/2020-FVC 2.47 [72%], TLC 3.39 [60%], DLCO 42%   CT chest 03/2020-baseline ILD with subpleural sparing, groundglass opacities with traction bronchiectasis.  Cysts versus honeycombing at the base.  Worsening compared to 2019   Echocardiogram May 2021-no pulmonary hypertension, positive for diastolic dysfunction   Imaging: High-res CT 09/14/2021-probable UIP pattern interstitial pneumonia, soft tissue mass in the anterior mediastinum, low-attenuation lesion in the liver.   MRI abdomen 10/13/2021-benign hepatic hemangiomas in the liver   PFTs: 11/10/2021 FVC 2.55 [78%], FEV1 2.20 [84%], F/F 86, TLC 3.75 [68%], DLCO 11.22 [48%] Mild restriction, moderate-severe diffusion defect   Labs: CMP 08/24/2021-within normal limits CBC significant for WBC count of 2.8 CTD serologies 08/24/2021-ANA 1:1280  11/30/2021 Follow up : NSIP-ILD related nonspecific connective tissue disease, Covid 19/PNA  Patient presents for an acute office visit.  Patient says that she  developed acute respiratory symptoms November 12, 2021, tested positive for COVID-19.  She was treated by her primary care provider at the [facility] system.  She was started on Paxlovid antiviral pack.  Along with amoxicillin x3 days.  She was told that she had COVID-pneumonia as well.  Patient says that her symptoms were pretty severe at the very beginning.  She is some better but continues to be weak.  And has ongoing cough and wheezing.  Now coughing up thick mucus.  She did start some leftover prednisone in the last 2 days.  Currently on 20mg   mg..  Patient says that she has increased shortness of breath decreased activity tolerance general malaise and nonproductive cough. Patient is not on oxygen.  O2 saturations today are 94% on room air.  Patient has been checking her oxygen levels at home and O2 saturations have been remaining above 90%. As above patient has ILD related to connective tissue disease she is on CellCept and Plaquenil. Chest x-ray today shows chronic ILD changes with widespread pulmonary opacities. (No comparison films )  Cough is quite severe at times.  Cough is also keeping her up at night.    Allergies  Allergen Reactions   Famotidine Anxiety and Palpitations    Immunization History  Administered Date(s) Administered   Influenza,inj,Quad PF,6+ Mos 09/02/2021   Moderna Sars-Covid-2 Vaccination 12/12/2019, 01/09/2020, 08/07/2020, 04/13/2021   Pneumococcal Conjugate-13 01/13/2015, 04/23/2020   Pneumococcal Polysaccharide-23 05/07/2013, 08/29/2013    Past Medical History:  Diagnosis Date   Hypertension    Interstitial lung disease ([place])     Tobacco History: Social History   Tobacco Use  Smoking Status Never  Smokeless Tobacco Never   Counseling given: Not Answered   Outpatient  Medications Prior to Visit  Medication Sig Dispense Refill   amLODipine (NORVASC) 5 MG tablet Take 5 mg by mouth daily.     chlorpheniramine (CHLOR-TRIMETON) 4 MG tablet Take 4 mg by mouth 2  (two) times daily as needed for allergies.     clobetasol ointment (TEMOVATE) [id number] % Apply 1 application topically 2 (two) times daily.     dorzolamide-timolol (COSOPT) 22.3-6.8 MG/ML ophthalmic solution 1 drop 2 (two) times daily.     hydroxychloroquine (PLAQUENIL) 200 MG tablet Take 200 mg by mouth 2 (two) times daily.     latanoprost (XALATAN) 0.005 % ophthalmic solution Place 1 drop into both eyes at bedtime.     levocetirizine (XYZAL) 5 MG tablet Take 5 mg by mouth every evening.     mycophenolate (CELLCEPT) 500 MG tablet Take 500 mg by mouth 2 (two) times daily.     predniSONE (DELTASONE) 5 MG tablet Take by mouth.     sulfamethoxazole-trimethoprim (BACTRIM) 200-40 MG/5ML suspension Take 160 mg by mouth once. 1 tablet Monday, Wednesday, and Friday     cetirizine (ZYRTEC) 10 MG chewable tablet Chew 10 mg by mouth daily.     omeprazole (PRILOSEC) 40 MG capsule Take 40 mg by mouth daily.     No facility-administered medications prior to visit.     Review of Systems:   Constitutional:   No  weight loss, night sweats,  Fevers, chills,  +fatigue, or  lassitude.  HEENT:   No headaches,  Difficulty swallowing,  Tooth/dental problems, or  Sore throat,                No sneezing, itching, ear ache, nasal congestion, post nasal drip,   CV:  No chest pain,  Orthopnea, PND, swelling in lower extremities, anasarca, dizziness, palpitations, syncope.   GI  No heartburn, indigestion, abdominal pain, nausea, vomiting, diarrhea, change in bowel habits, loss of appetite, bloody stools.   Resp: .  No chest wall deformity  Skin: no rash or lesions.  GU: no dysuria, change in color of urine, no urgency or frequency.  No flank pain, no hematuria   MS:  No joint pain or swelling.  No decreased range of motion.  No back pain.    Physical Exam  BP 110/70 (BP Location: Left Arm, Patient Position: Sitting, Cuff Size: Large)    Pulse (!) 104    Temp 98.5 F (36.9 C) (Oral)    Ht 5\' 7"  (1.702 m)     Wt 209 lb (94.8 kg)    SpO2 94%    BMI 32.73 kg/m   GEN: A/Ox3; pleasant , NAD, well nourished    HEENT:  [place]/AT,  EACs-clear, TMs-wnl, NOSE-clear, THROAT-clear, no lesions, no postnasal drip or exudate noted.   NECK:  Supple w/ fair ROM; no JVD; normal carotid impulses w/o bruits; no thyromegaly or nodules palpated; no lymphadenopathy.    RESP faint bibasilar crackles  no accessory muscle use, no dullness to percussion  CARD:  RRR, no m/r/g, no peripheral edema, pulses intact, no cyanosis or clubbing.  GI:   Soft & nt; nml bowel sounds; no organomegaly or masses detected.   Musco: Warm bil, no deformities or joint swelling noted.   Neuro: alert, no focal deficits noted.    Skin: Warm, no lesions or rashes    Lab Results:  CBC   BMET   BNP No results found for: BNP  ProBNP   Imaging: DG Chest 2 View  Result Date: 11/30/2021 CLINICAL DATA:  Follow-up coronavirus pneumonia. Also history of nonspecific interstitial pneumonia. EXAM: CHEST - 2 VIEW COMPARISON:  CT of the chest 09/14/2021 done for chronic shortness of breath. FINDINGS: Heart size is normal. Mediastinal shadows are normal. There are widespread patchy bilateral pulmonary opacities most pronounced in the lower lobes with some mild emphysematous changes in the periphery of the lungs, better shown by CT. These findings are consistent with chronic interstitial lung disease. There is no prior chest radiograph with which to compare, from any time where acute pneumonia was described. Therefore, it is certainly possible that some of these patchy densities could relate to previous or even ongoing coronavirus pneumonia, but similarly the may simply reflect the chronic lung disease shown by CT. No dense consolidation or collapse. No effusion. IMPRESSION: Widespread patchy bilateral pulmonary opacities, lower lung predominant, consistent with the chronic interstitial lung disease as shown by prior CT. There is no prior chest  radiograph with which to compare. I cannot rule out some element of acute or subacute pneumonia. Electronically Signed   By: Paulina Fusi M.D.   On: 11/30/2021 15:05      PFT Results Latest Ref Rng & Units 11/10/2021  FVC-Pre L 2.63  FVC-Predicted Pre % 81  FVC-Post L 2.55  FVC-Predicted Post % 78  Pre FEV1/FVC % % 81  Post FEV1/FCV % % 86  FEV1-Pre L 2.12  FEV1-Predicted Pre % 81  FEV1-Post L 2.20  DLCO uncorrected ml/min/mmHg 11.22  DLCO UNC% % 48  DLCO corrected ml/min/mmHg 11.22  DLCO COR %Predicted % 48  DLVA Predicted % 75  TLC L 3.75  TLC % Predicted % 68  RV % Predicted % 52    No results found for: NITRICOXIDE      Assessment & Plan:   COVID-19 virus infection COVID-19 infection-tested positive on January 6.  She has completed a 5-day course of antivirals.  She continues to have ongoing cough-most likely second to a post viral cough. Will treat for possible superimposed acute asthmatic bronchitis. With empiric antibiotics and steroids. Chest x-ray today shows interstitial lung disease difficult to determine if she has superimposed pneumonia or not. Clinically appears stable with normal O2 saturations.  Patient is at high risk due to her immunosuppression. We went over red flag symptoms to report immediately for sooner follow-up or seek emergency room care  Plan  Patient Instructions  Zpack take as directed.  Prednisone taper  Liquid Mucinex DM Twice daily  As needed  cough/congestion  Tessalon Three times a day  for cough/congestion  Hydromet 1 tsp .At bedtime  for severe cough As needed  , may make you sleepy.  Follow up with Dr. Isaiah Serge as planned , return for your planned labs in 2 weeks.  Please contact office for sooner follow up if symptoms do not improve or worsen or seek emergency care           Acute bronchitis Acute bronchitis.  We will treat with empiric antibiotics and steroids.  Plan  Patient Instructions  Zpack take as directed.   Prednisone taper  Liquid Mucinex DM Twice daily  As needed  cough/congestion  Tessalon Three times a day  for cough/congestion  Hydromet 1 tsp .At bedtime  for severe cough As needed  , may make you sleepy.  Follow up with Dr. Isaiah Serge as planned , return for your planned labs in 2 weeks.  Please contact office for sooner follow up if symptoms do not improve or worsen or seek emergency care  ILD (interstitial lung disease) ([place]) ILD related ILD related to connective tissue disorder-appears stable normal O2 saturations on room air. Patient is continue on her current immunosuppression regimen. Patient is to return for her upcoming labs in 2 weeks as discussed.      Rexene Edison, NP 11/30/2021

## 2021-11-30 NOTE — Assessment & Plan Note (Signed)
COVID-19 infection-tested positive on January 6.  She has completed a 5-day course of antivirals.  She continues to have ongoing cough-most likely second to a post viral cough. Will treat for possible superimposed acute asthmatic bronchitis. With empiric antibiotics and steroids. Chest x-ray today shows interstitial lung disease difficult to determine if she has superimposed pneumonia or not. Clinically appears stable with normal O2 saturations.  Patient is at high risk due to her immunosuppression. We went over red flag symptoms to report immediately for sooner follow-up or seek emergency room care  Plan  Patient Instructions  Zpack take as directed.  Prednisone taper  Liquid Mucinex DM Twice daily  As needed  cough/congestion  Tessalon Three times a day  for cough/congestion  Hydromet 1 tsp .At bedtime  for severe cough As needed  , may make you sleepy.  Follow up with Dr. Isaiah Serge as planned , return for your planned labs in 2 weeks.  Please contact office for sooner follow up if symptoms do not improve or worsen or seek emergency care

## 2021-12-02 ENCOUNTER — Other Ambulatory Visit: Payer: No Typology Code available for payment source

## 2021-12-06 ENCOUNTER — Encounter: Payer: Self-pay | Admitting: Genetic Counselor

## 2021-12-06 ENCOUNTER — Inpatient Hospital Stay: Payer: No Typology Code available for payment source | Attending: Genetic Counselor | Admitting: Genetic Counselor

## 2021-12-06 ENCOUNTER — Inpatient Hospital Stay: Payer: No Typology Code available for payment source

## 2021-12-06 ENCOUNTER — Other Ambulatory Visit: Payer: Self-pay

## 2021-12-06 DIAGNOSIS — Z803 Family history of malignant neoplasm of breast: Secondary | ICD-10-CM | POA: Diagnosis not present

## 2021-12-06 HISTORY — DX: Family history of malignant neoplasm of breast: Z80.3

## 2021-12-06 LAB — GENETIC SCREENING ORDER

## 2021-12-06 NOTE — Progress Notes (Signed)
REFERRING PROVIDER: Carmelia Roller, MD [address] [place],  [place] [address]  PRIMARY PROVIDER:  [name], MD  PRIMARY REASON FOR VISIT:  1. Family history of breast cancer      HISTORY OF PRESENT ILLNESS:   Ms. Heinrichs, a 51 y.o. female, was seen for a [facility] cancer genetics consultation at the request of Dr. Laurance Flatten due to a family history of cancer.  Ms. Frediani presents to clinic today to discuss the possibility of a hereditary predisposition to cancer, to discuss genetic testing, and to further clarify her future cancer risks, as well as potential cancer risks for family members.   Ms. Koogler is a 51 y.o. female with no personal history of cancer. Ms. Denman is scheduled to have a MRI-guided breast biopsy on Wednesday, February 1st, 2023.  CANCER HISTORY:  Oncology History   No history exists.     RISK FACTORS:  Menarche was at age 49.  Nulliparous.  OCP use for approximately 4 years.  Ovaries intact: no per patient.  Hysterectomy: yes in 2019 due to fibroids.  LMP prior to hysterectomy in 2019. HRT use: estradiol for 90 days within the past five years. Colonoscopy: no; not examined. Mammogram within the last year: yes. Number of breast biopsies: 2: 2017 (L) and 2022 (R); benign per patient. Up to date with pelvic exams: yes. Any excessive radiation exposure in the past: no  Past Medical History:  Diagnosis Date   Family history of breast cancer 12/06/2021   Hypertension    Interstitial lung disease ([place])     No past surgical history on file.  FAMILY HISTORY:  We obtained a detailed, 4-generation family history.  Significant diagnoses are listed below: Family History  Problem Relation Age of Onset   Breast cancer Mother        dx 87s; dx 57 (bilateral w/ mets)   Throat cancer Maternal Uncle    Breast cancer Paternal Aunt        dx 50s   Breast cancer Paternal Grandmother        dx before 35   Brain cancer Other        MGM's  brothers x2   Lung cancer Other        MGM's brother    Ms. Herst is aware of previous family history of genetic testing for hereditary cancer risks. Her sister reportedly had negative genetic testing, but no report was available for review today. There is no reported Ashkenazi Jewish ancestry. There is no known consanguinity.  GENETIC COUNSELING ASSESSMENT: Ms. Forsee is a 51 y.o. female with a family history of cancer which is somewhat suggestive of a hereditary cancer syndrome and predisposition to cancer given her mother's age of breast cancer diagnosis. We, therefore, discussed and recommended the following at today's visit.   DISCUSSION: We discussed that 5 - 10% of cancer is hereditary, with most cases of hereditary breast cancer associated with BRCA1 and BRCA2.  There are other genes that can be associated with hereditary breast cancer syndromes. We discussed that testing is beneficial for several reasons, including knowing about other cancer risks, identifying potential screening and risk-reduction options that may be appropriate, and to understanding if other family members could be at risk for cancer and allowing them to undergo genetic testing.  We reviewed the characteristics, features and inheritance patterns of hereditary cancer syndromes. We also discussed genetic testing, including the appropriate family members to test, the process of testing, insurance coverage and turn-around-time for  results. We discussed the implications of a negative, positive, carrier and/or variant of uncertain significant result. We discussed that negative results would be uninformative given that Ms. Mergenthaler does not have a personal history of cancer. We recommended Ms. Sawdey pursue genetic testing for a panel that contains genes associated with breast cancer.  Given her upcoming breast biopsy, in order to obtain results more quickly, we recommended Ms. Ziesmer pursue the [facility] BRCAPlus STAT  panel.  The BRCAplus panel offered by [employer] and includes sequencing and deletion/duplication analysis for the following 8 genes: ATM, BRCA1, BRCA2, CDH1, CHEK2, PALB2, PTEN, and TP53.  Ms. Mallette was offered a common hereditary cancer panel (47 genes) and an expanded pan-cancer panel (77 genes). Ms. Dorsainvil was informed of the benefits and limitations of each panel, including that expanded pan-cancer panels contain several genes that do not have clear management guidelines at this point in time.  We also discussed that as the number of genes included on a panel increases, the chances of variants of uncertain significance increases.  After considering the benefits and limitations of each gene panel, Ms. Sulkowski elected to have the CustomNext-Cancer+RNAinsight panel offered by [facility]. This test includes sequencing and rearrangement analysis for the following 47 genes:  APC, ATM, AXIN2, BARD1, BMPR1A, BRCA1, BRCA2, BRIP1, CDH1, CDK4, CDKN2A, CHEK2, DICER1, EPCAM, GREM1, HOXB13, MEN1, MLH1, MSH2, MSH3, MSH6, MUTYH, NBN, NF1, NF2, NTHL1, PALB2, PMS2, POLD1, POLE, PTEN, RAD51C, RAD51D, RECQL, RET, SDHA, SDHAF2, SDHB, SDHC, SDHD, SMAD4, SMARCA4, STK11, TP53, TSC1, TSC2, and VHL.  RNA data is routinely analyzed for use in variant interpretation for all genes.  Based on Ms. Thede's family history of cancer in her mother before age 40, she meets medical criteria for genetic testing. Despite that she meets criteria, she may still have an out of pocket cost. We discussed that if her out of pocket cost for testing is over $100, the laboratory should contact her to discuss self-pay options and/or patient pay assistance programs.   We discussed that some people do not want to undergo genetic testing due to fear of genetic discrimination.  A federal law called the Genetic Information Non-Discrimination Act (GINA) of 2008 helps protect individuals against genetic discrimination based on their  genetic test results.  It impacts both health insurance and employment.  With health insurance, it protects against increased premiums, being kicked off insurance or being forced to take a test in order to be insured.  For employment it protects against hiring, firing and promoting decisions based on genetic test results.  GINA does not apply to those in the [employer], those who work for companies with less than 15 employees, and new life insurance or long-term disability insurance policies.  Health status due to a cancer diagnosis is not protected under GINA.  PLAN: After considering the risks, benefits, and limitations, Ms. Wishart provided informed consent to pursue genetic testing and the blood sample was sent to [facility] for analysis of the BRCAplus and CustomNext Cancer+RNAinsight. Results should be available within approximately 1-2 weeks' time, at which point they will be disclosed by telephone to Ms. Balistreri, as will any additional recommendations warranted by these results. Ms. Dorval will receive a summary of her genetic counseling visit and a copy of her results once available. This information will also be available in Epic.   Based on Ms. Isadore's family history, we recommended her brother could also pursue genetic counseling and testing. Ms. Wilmore will let us know if we can be of  any assistance in coordinating genetic counseling and/or testing for this family member.   Lastly, we encouraged Ms. Nevers to remain in contact with cancer genetics annually so that we can continuously update the family history and inform her of any changes in cancer genetics and testing that may be of benefit for this family.   Ms. Emry questions were answered to her satisfaction today. Our contact information was provided should additional questions or concerns arise. Thank you for the referral and allowing [place] to share in the care of your patient.   Zanae Kuehnle M. Joette Catching, [place], [facility] Genetic  Counselor Keigan [email or web address] (P) [phone]   The patient was seen for a total of 40 minutes in face-to-face genetic counseling. The patient was seen alone.  Drs. Lindi Adie and/or Burr Medico were available to discuss this case as needed.  _______________________________________________________________________ For Office Staff:  Number of people involved in session: 1 Was an Intern/ student involved with case: yes; Lorenza Burton, [place] intern

## 2021-12-08 ENCOUNTER — Ambulatory Visit
Admission: RE | Admit: 2021-12-08 | Discharge: 2021-12-08 | Disposition: A | Discharge status: Home / Self Care | Payer: 59 | Source: Ambulatory Visit | Attending: Surgery | Admitting: Surgery

## 2021-12-08 ENCOUNTER — Ambulatory Visit
Admission: RE | Admit: 2021-12-08 | Discharge: 2021-12-08 | Disposition: A | Discharge status: Home / Self Care | Payer: No Typology Code available for payment source | Source: Ambulatory Visit | Attending: Surgery | Admitting: Surgery

## 2021-12-08 ENCOUNTER — Other Ambulatory Visit: Payer: Self-pay

## 2021-12-08 DIAGNOSIS — R9389 Abnormal findings on diagnostic imaging of other specified body structures: Secondary | ICD-10-CM

## 2021-12-08 IMAGING — MR MR BREAST BX W/ LOC DEV 1ST LEASION IMAGE BX SPEC MR GUIDE*R*
9 of 12 series · 33 of 48 positions shown · IV contrast (6 ml gadavist)
Comparison: Previous exams.
COMPARISON: Previous exams.

Addendum:
CLINICAL DATA: 50-year-old female presents for MRI guided biopsy of
an enhancing focus in the inner right breast.

EXAM:
MRI GUIDED CORE NEEDLE BIOPSY OF THE RIGHT BREAST
TECHNIQUE: Multiplanar, multisequence MR imaging of the right breast was
performed both before and after administration of intravenous
contrast.
CONTRAST:  6mL GADAVIST GADOBUTROL 1 MMOL/ML IV SOLN

[Series 2: fiducial unilateral · sagittal · 2.0mm · 1.33mm/px · 3 of 52 slices shown]
[im 1/52]
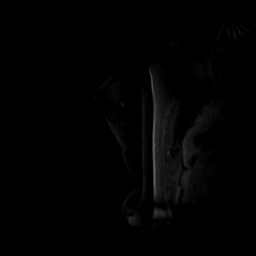
[im 26/52]
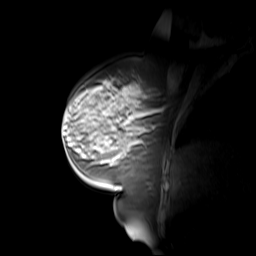
[im 52/52]
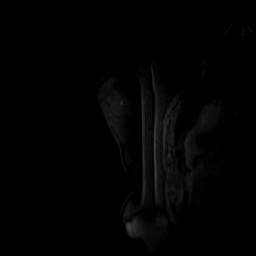

[Series 3: dynamic pre · axial · non-contrast · 1.3mm · 0.73mm/px · z∈[-69,+117]mm · 5 of 144 slices shown]
[im 1/144]
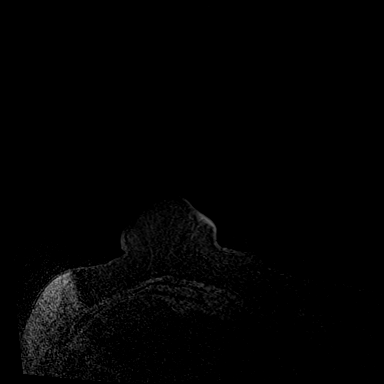
[im 36/144]
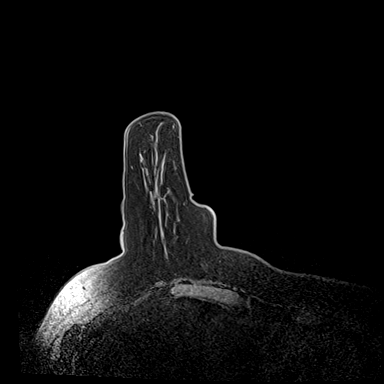
[im 72/144]
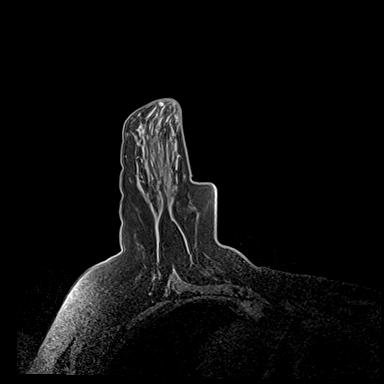
[im 108/144]
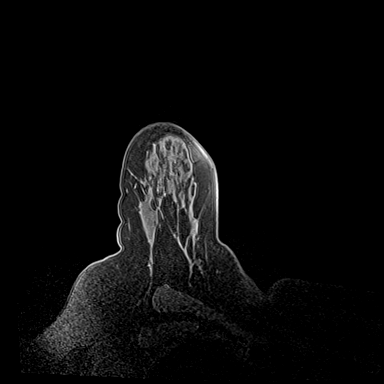
[im 144/144]
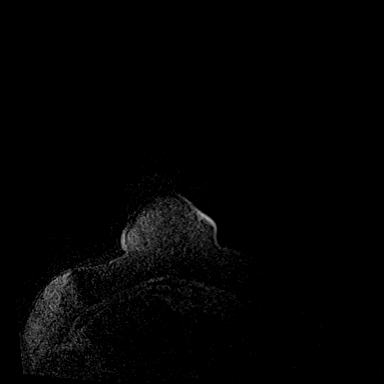

[Series 4: dynamic post 20 · axial · 1.3mm · 0.73mm/px · z∈[-69,+117]mm · 4 of 144 slices shown (1 of 2)]
[im 1/144]
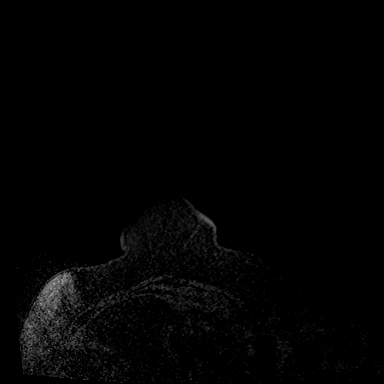
[im 48/144]
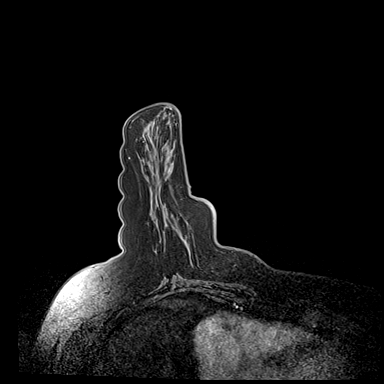
[im 96/144]
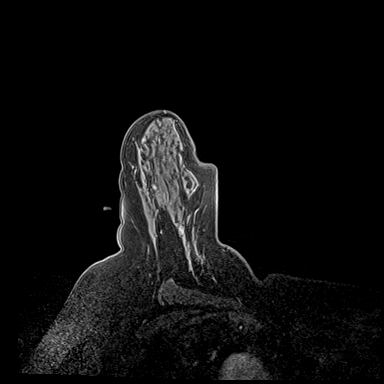
[im 144/144]
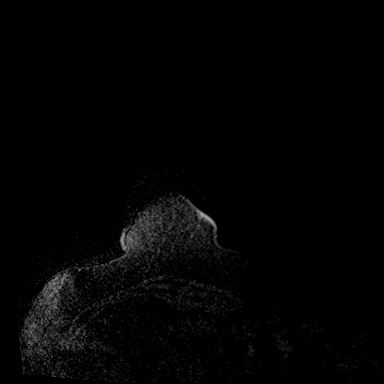

[Series 5: dynamic post 20 · axial · 1.3mm · 0.73mm/px · z∈[-69,+117]mm · 4 of 143 slices shown (2 of 2)]
[im 1/143]
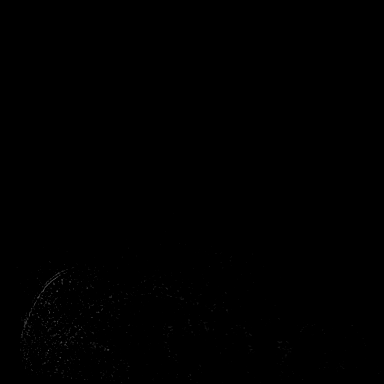
[im 48/143]
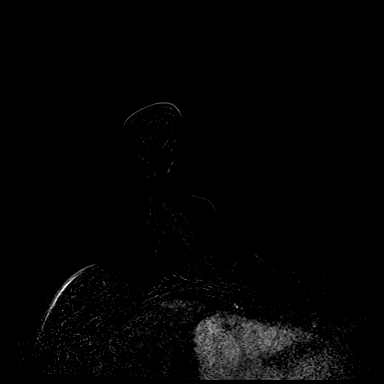
[im 95/143]
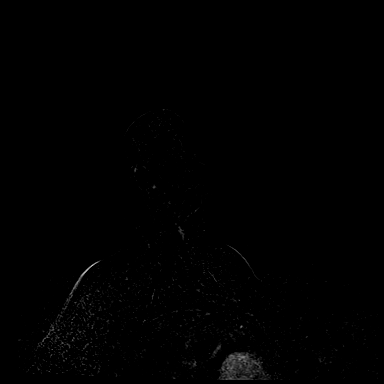
[im 143/143]
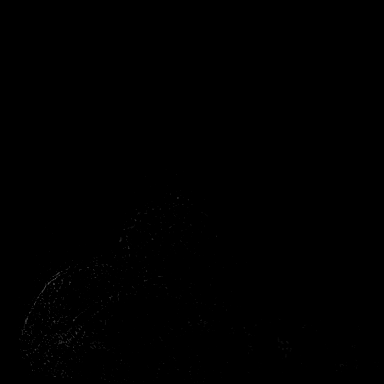

[Series 6: dynamic post 3 · axial · 1.3mm · 0.73mm/px · z∈[-69,+117]mm · 4 of 144 slices shown (1 of 2)]
[im 1/144]
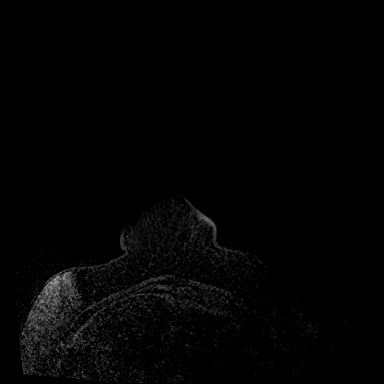
[im 48/144]
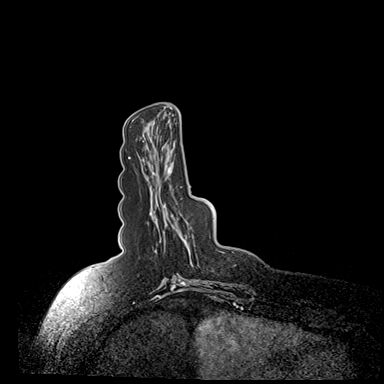
[im 96/144]
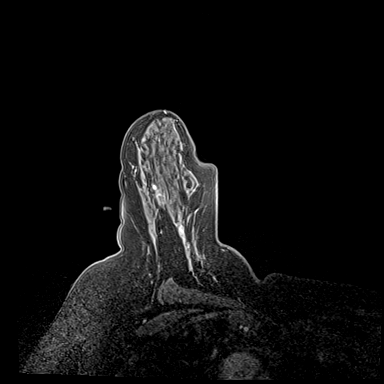
[im 144/144]
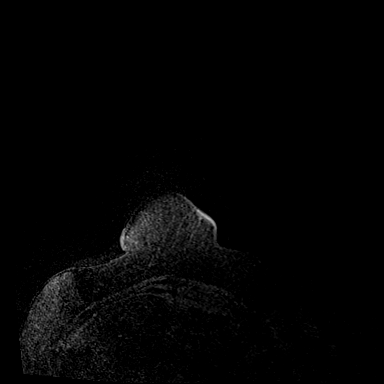

[Series 7: dynamic post 3 · axial · 1.3mm · 0.73mm/px · z∈[-69,+117]mm · 4 of 142 slices shown (2 of 2)]
[im 1/142]
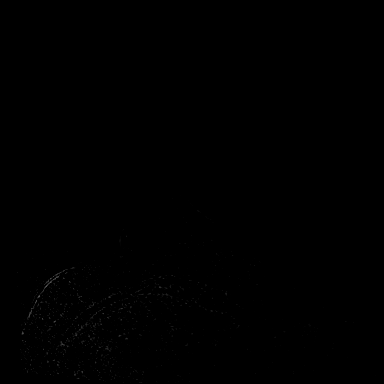
[im 48/142]
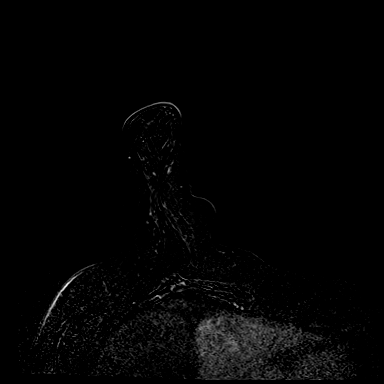
[im 95/142]
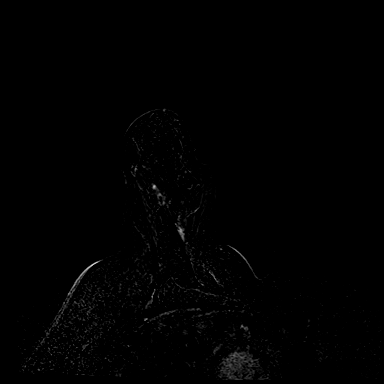
[im 142/142]
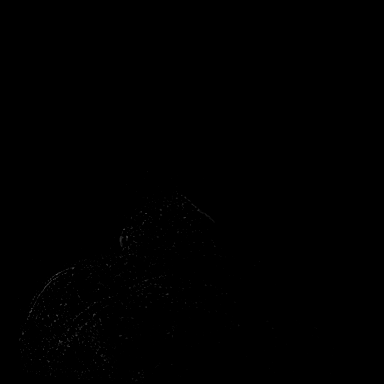

[Series 8: dynamic post 6 · axial · 1.3mm · 0.73mm/px · z∈[-69,+117]mm · 4 of 144 slices shown (1 of 2)]
[im 1/144]
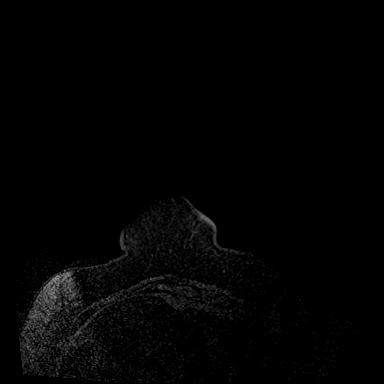
[im 48/144]
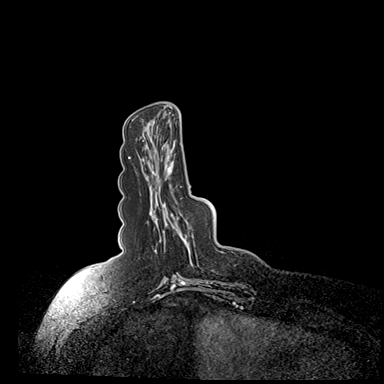
[im 96/144]
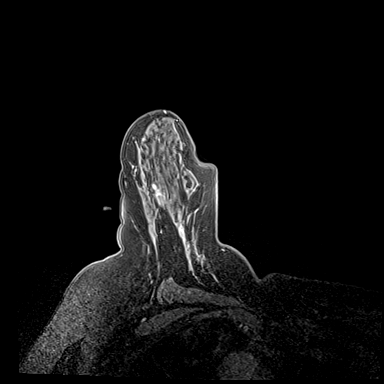
[im 144/144]
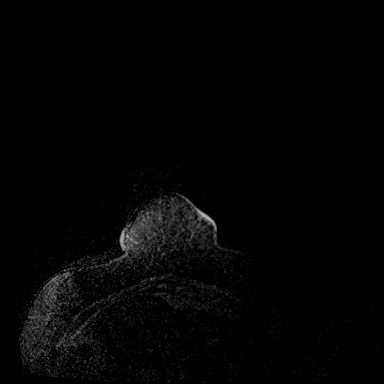

[Series 9: dynamic post 6 · axial · 1.3mm · 0.73mm/px · z∈[-69,+117]mm · 4 of 144 slices shown (2 of 2)]
[im 1/144]
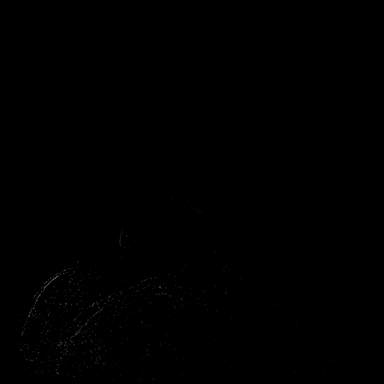
[im 48/144]
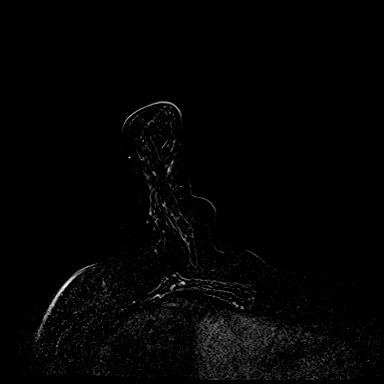
[im 96/144]
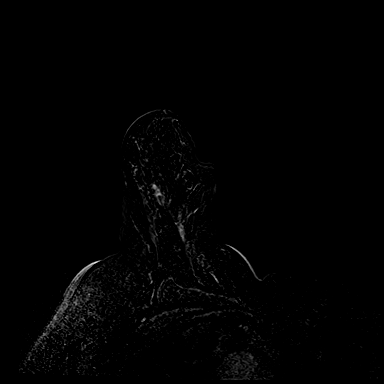
[im 144/144]
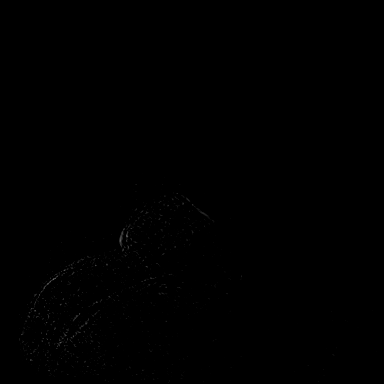

[Series 10: needle confirmation · axial · 1.3mm · 0.73mm/px · 1 of 144 slices shown]
[im 1/144]
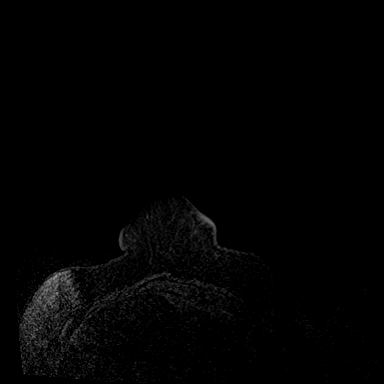

[33 of 48 positions shown; findings below may reference images not displayed]

FINDINGS: I met with the patient, and we discussed the procedure of MRI guided
biopsy, including risks, benefits, and alternatives. Specifically,
we discussed the risks of infection, bleeding, tissue injury, clip
migration, and inadequate sampling. Informed, written consent was
given. The usual time out protocol was performed immediately prior
to the procedure.

Using sterile technique, 1% Lidocaine, MRI guidance, and a 9 gauge
vacuum assisted device, biopsy was performed of the enhancing focus
in the inner right breast using a lateral to medial approach. At the
conclusion of the procedure, a a dumbbell shaped tissue marker clip
was deployed into the biopsy cavity. Follow-up 2-view mammogram was
performed and dictated separately.
IMPRESSION: MRI guided biopsy of the enhancing focus in the inner right breast.
No apparent complications.

ADDENDUM:
Pathology revealed FOCAL ADENOSIS WITH MICROCALCIFICATIONS- NO
MALIGNANCY IDENTIFIED of the RIGHT breast, inner (dumbbell clip).
This was found to be concordant by Dr. PILGRIM.

The patient reported on [DATE] to PILGRIM RT of [REDACTED] [place] [REDACTED], she was doing well
after the biopsy with tenderness at the site.

Pathology results were discussed with the patient by telephone on
[DATE]. Post biopsy instructions and care were reviewed and
questions answered. The patient was encouraged to call [facility]

Bilateral breast MRI recommended in 6 months per protocol.

Pathology results reported by PILGRIM RN on [DATE].

*** End of Addendum ***
FINDINGS: I met with the patient, and we discussed the procedure of MRI guided
biopsy, including risks, benefits, and alternatives. Specifically,
we discussed the risks of infection, bleeding, tissue injury, clip
migration, and inadequate sampling. Informed, written consent was
given. The usual time out protocol was performed immediately prior
to the procedure.

Using sterile technique, 1% Lidocaine, MRI guidance, and a 9 gauge
vacuum assisted device, biopsy was performed of the enhancing focus
in the inner right breast using a lateral to medial approach. At the
conclusion of the procedure, a a dumbbell shaped tissue marker clip
was deployed into the biopsy cavity. Follow-up 2-view mammogram was
performed and dictated separately.
IMPRESSION: MRI guided biopsy of the enhancing focus in the inner right breast.
No apparent complications.

## 2021-12-08 IMAGING — MG MM BREAST LOCALIZATION CLIP
4 series · 4 of 12 positions shown · non-contrast
Comparison: Previous exam(s).

CLINICAL DATA: Post MRI guided biopsy an enhancing focus in the
inner right breast.

EXAM:
3D DIAGNOSTIC RIGHT MAMMOGRAM POST MRI BIOPSY

[R CC synth-2D]
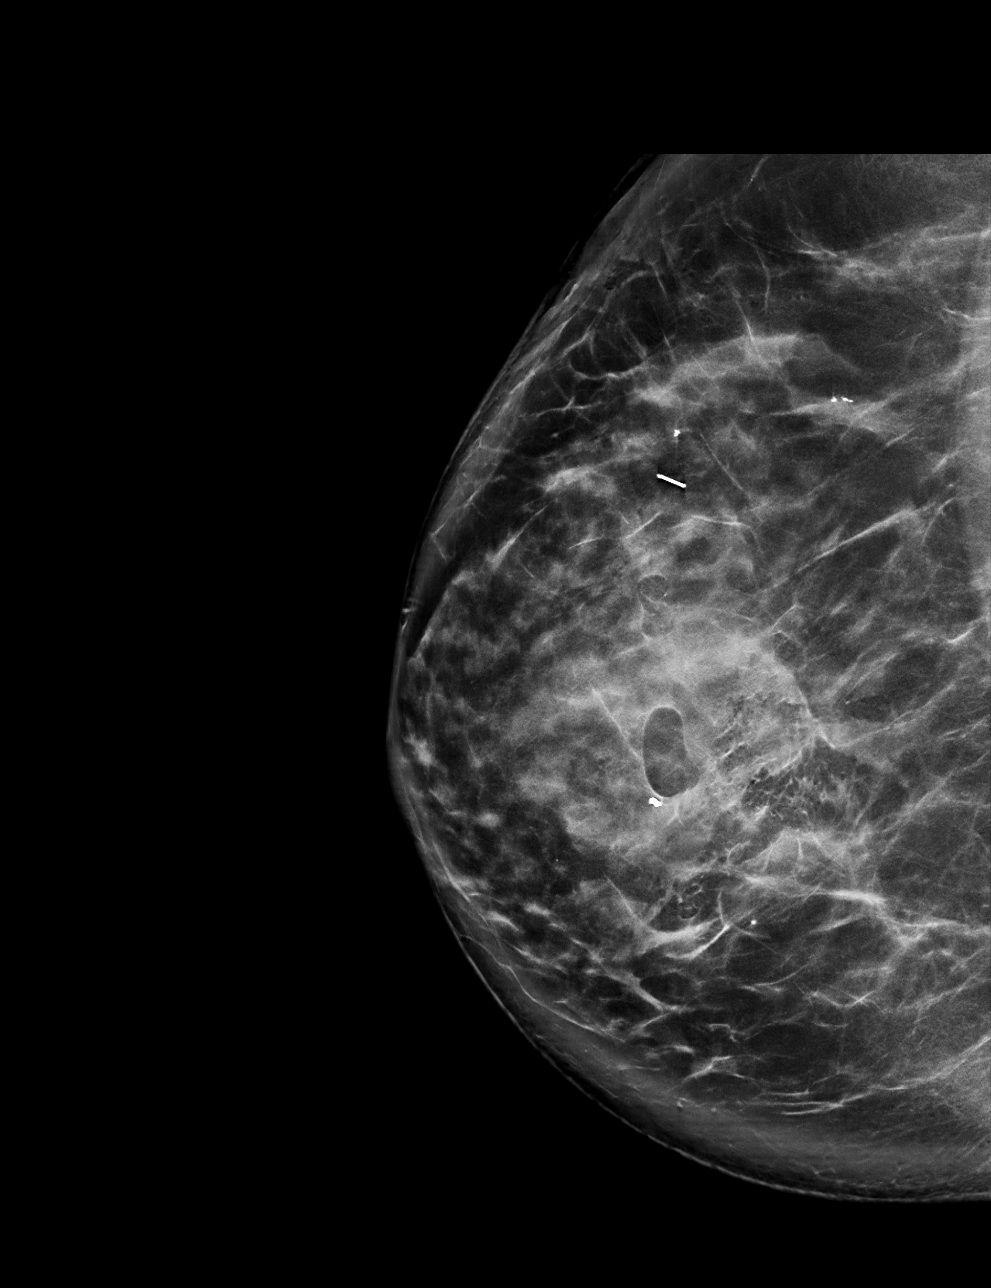

[R ML synth-2D]
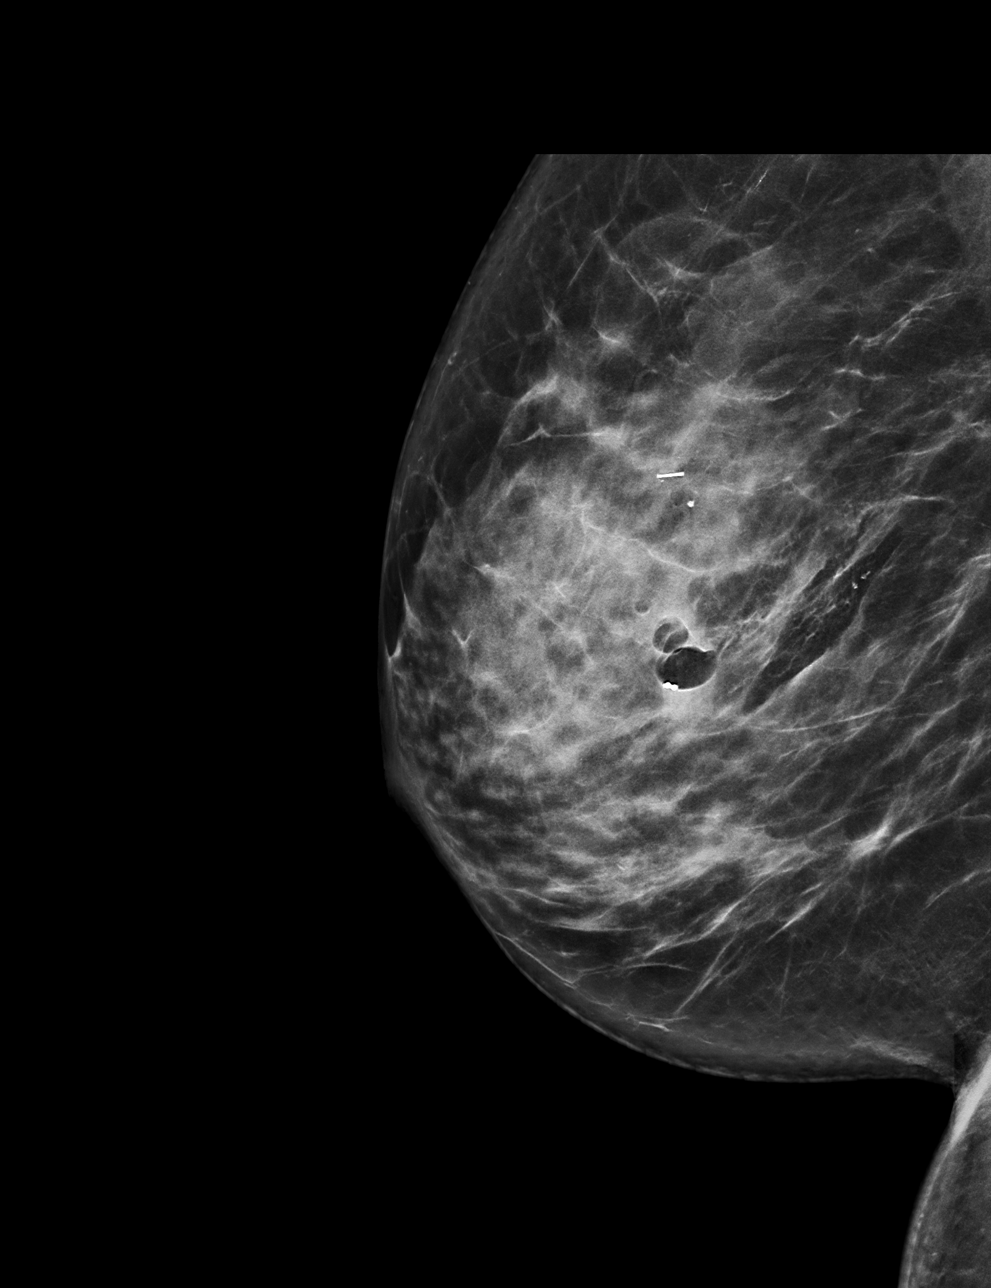

[R CC tomo · tomo slice 46/91.0]
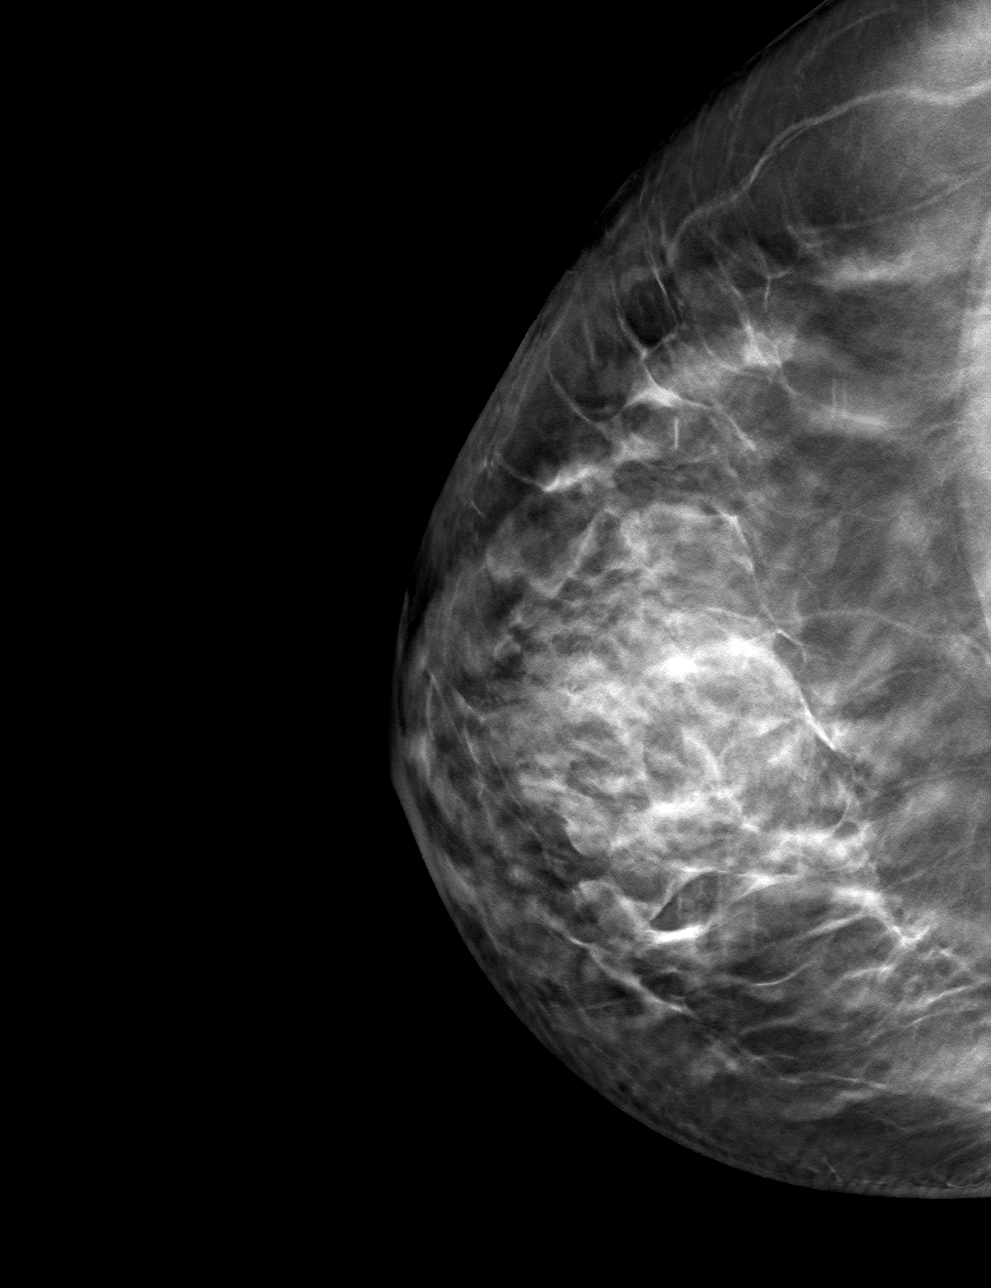

[R ML tomo · tomo slice 39/76.0]
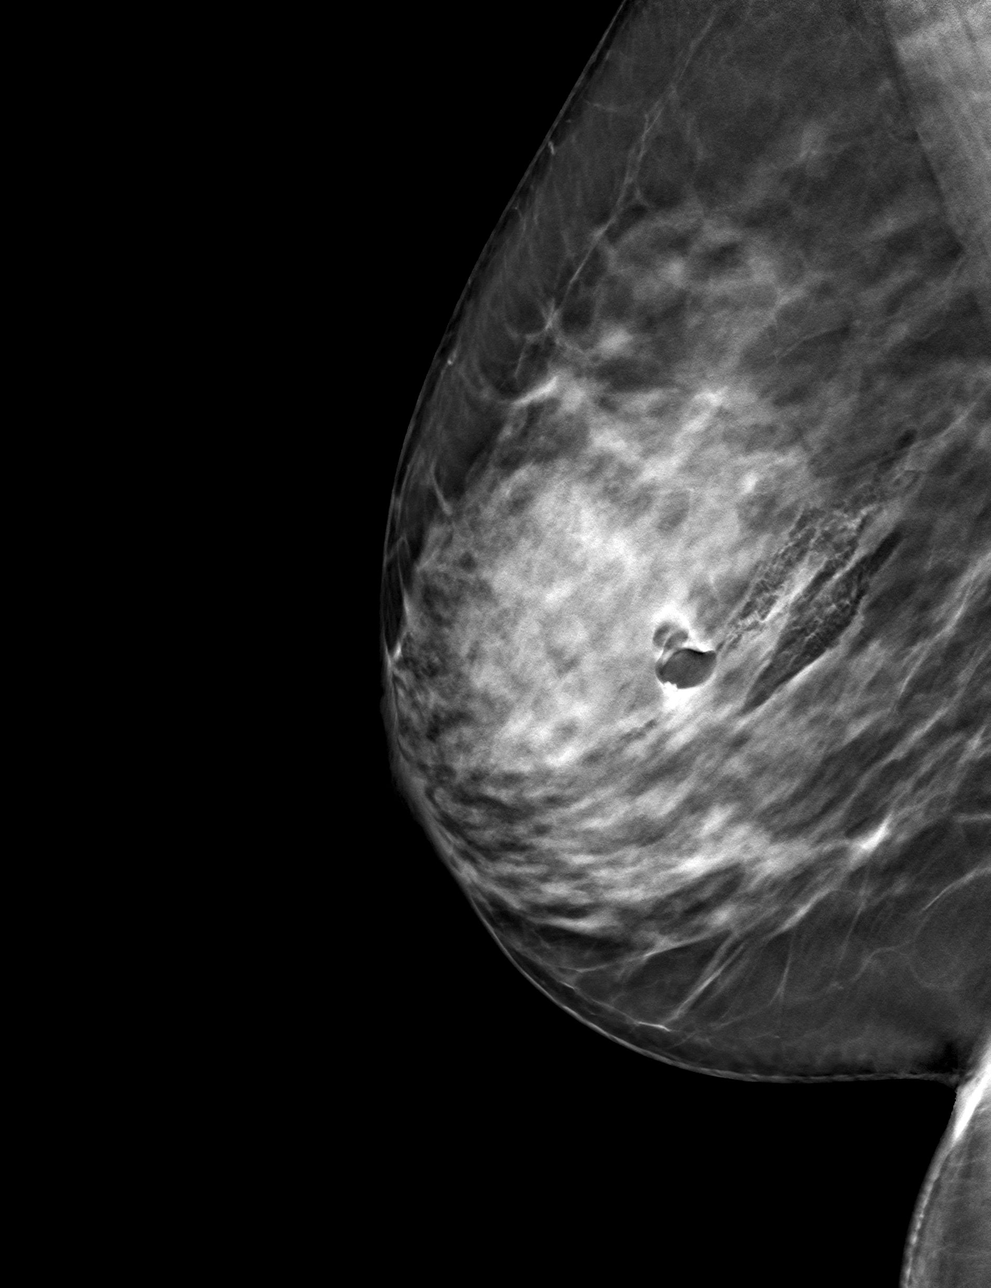

[4 of 12 positions shown; findings below may reference images not displayed]

FINDINGS: 3D Mammographic images were obtained following MRI guided biopsy of
an enhancing focus in the inner right breast. A dumbbell shaped
biopsy marking clip is present at the site of the biopsied enhancing
focus in the inner right breast.
IMPRESSION: Dumbbell shaped biopsy marking clip at site of biopsied enhancing
focus in the inner right breast.

Final Assessment: Post Procedure Mammograms for Marker Placement

## 2021-12-08 MED ORDER — GADOBUTROL 1 MMOL/ML IV SOLN
6.0000 mL | Freq: Once | INTRAVENOUS | Status: AC | PRN
  Administered 2021-12-08: 6 mL via INTRAVENOUS

## 2021-12-16 ENCOUNTER — Telehealth: Payer: Self-pay | Admitting: Genetic Counselor

## 2021-12-16 ENCOUNTER — Encounter: Payer: Self-pay | Admitting: Genetic Counselor

## 2021-12-16 DIAGNOSIS — Z1379 Encounter for other screening for genetic and chromosomal anomalies: Secondary | ICD-10-CM | POA: Insufficient documentation

## 2021-12-16 NOTE — Telephone Encounter (Signed)
Revealed negative genetic testing for breast STAT panel.  Discussed that we do not know why there is cancer in the family. It could be sporadic/familial, due to a change in a gene she did not inherit, due to a different gene that we are not testing, or maybe our current technology may not be able to pick something up.  It will be important for her to keep in contact with genetics to keep up with whether additional testing may be needed.  Recommended genetic testing for brother and maternal family members.  Results of pan-cancer panel are pending and will be disclosed to patient when available.

## 2021-12-24 ENCOUNTER — Telehealth: Payer: Self-pay | Admitting: Pulmonary Disease

## 2021-12-24 MED ORDER — LEVOCETIRIZINE DIHYDROCHLORIDE 5 MG PO TABS: 5.0000 mg | ORAL_TABLET | Freq: Every evening | ORAL | 5 refills | Status: DC

## 2021-12-24 MED ORDER — SULFAMETHOXAZOLE-TRIMETHOPRIM 800-160 MG PO TABS: 1.0000 | ORAL_TABLET | ORAL | 5 refills | Status: DC

## 2021-12-24 NOTE — Telephone Encounter (Signed)
Spoke with patient.  Patient requested Bactrim and Xyzal refills be sent to [facility].  Requested prescriptions sent to requested pharmacy.  Nothing further at this time.

## 2021-12-27 ENCOUNTER — Other Ambulatory Visit (INDEPENDENT_AMBULATORY_CARE_PROVIDER_SITE_OTHER): Payer: No Typology Code available for payment source

## 2021-12-27 DIAGNOSIS — J8410 Pulmonary fibrosis, unspecified: Secondary | ICD-10-CM | POA: Diagnosis not present

## 2021-12-27 LAB — CBC WITH DIFFERENTIAL/PLATELET
Basophils Absolute: 0 10*3/uL (ref 0.0–0.1)
Basophils Relative: 0.6 % (ref 0.0–3.0)
Eosinophils Absolute: 0 10*3/uL (ref 0.0–0.7)
Eosinophils Relative: 0.1 % (ref 0.0–5.0)
HCT: 37.6 % (ref 36.0–46.0)
Hemoglobin: 12.4 g/dL (ref 12.0–15.0)
Lymphocytes Relative: 20.4 % (ref 12.0–46.0)
Lymphs Abs: 0.7 10*3/uL (ref 0.7–4.0)
MCHC: 32.9 g/dL (ref 30.0–36.0)
MCV: 90.2 fl (ref 78.0–100.0)
Monocytes Absolute: 0.4 10*3/uL (ref 0.1–1.0)
Monocytes Relative: 9.9 % (ref 3.0–12.0)
Neutro Abs: 2.5 10*3/uL (ref 1.4–7.7)
Neutrophils Relative %: 69 % (ref 43.0–77.0)
Platelets: 319 10*3/uL (ref 150.0–400.0)
RBC: 4.17 Mil/uL (ref 3.87–5.11)
RDW: 13 % (ref 11.5–15.5)
WBC: 3.6 10*3/uL — ABNORMAL LOW (ref 4.0–10.5)

## 2021-12-28 NOTE — Addendum Note (Signed)
Addended byChilton Greathouse on: 12/28/2021 06:25 PM   Modules accepted: Orders

## 2021-12-29 ENCOUNTER — Encounter: Payer: Self-pay | Admitting: Genetic Counselor

## 2021-12-29 ENCOUNTER — Telehealth: Payer: Self-pay | Admitting: Genetic Counselor

## 2021-12-29 ENCOUNTER — Ambulatory Visit: Payer: Self-pay | Admitting: Genetic Counselor

## 2021-12-29 DIAGNOSIS — Z803 Family history of malignant neoplasm of breast: Secondary | ICD-10-CM

## 2021-12-29 DIAGNOSIS — Z1379 Encounter for other screening for genetic and chromosomal anomalies: Secondary | ICD-10-CM

## 2021-12-29 DIAGNOSIS — Z1501 Genetic susceptibility to malignant neoplasm of breast: Secondary | ICD-10-CM

## 2021-12-29 HISTORY — DX: Genetic susceptibility to malignant neoplasm of breast: Z15.01

## 2021-12-29 NOTE — Telephone Encounter (Signed)
Disclosed mutation in [employer].  Discussed cancer risks, management, and implications for family.

## 2021-12-29 NOTE — Progress Notes (Addendum)
GENETIC TEST RESULTS  Patient Name: Chelsea Jennings Patient Age: 51 y.o. Encounter Date: 12/29/2021  Referring Provider: Robb Matar, MD  Ms. Heit was seen in the Cancer Genetics clinic on December 06, 2021 due to a family history of breast cancer and concern regarding a hereditary predisposition to cancer in the family. Please refer to the prior Genetics clinic note for more information regarding Ms. Krikorian's medical and family histories and our assessment at the time.   FAMILY HISTORY:  We obtained a detailed, 4-generation family history.  Significant diagnoses are listed below: Family History  Problem Relation Age of Onset   Breast cancer Mother        dx 15s; dx 68 (bilateral w/ mets)   Throat cancer Maternal Uncle    Breast cancer Paternal Aunt        dx 5s   Breast cancer Paternal Grandmother        dx before 12   Brain cancer Other        MGM's brothers x2   Lung cancer Other        MGM's brother   Ms. Lahti is aware of previous family history of genetic testing for hereditary cancer risks. Her sister reportedly had negative genetic testing, but no report was available for review today. There is no reported Ashkenazi Jewish ancestry. There is no known consanguinity.    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  No other pathogenic variants were detected in the [employer] CustomNext-Cancer +RNAinsight Panel.  The CustomNext-Cancer+RNAinsight panel offered by [facility] includes sequencing and rearrangement analysis for the following 47 genes:  APC, ATM, AXIN2, BARD1, BMPR1A, BRCA1, BRCA2, BRIP1, CDH1, CDK4, CDKN2A, CHEK2, DICER1, EPCAM, GREM1, HOXB13, MEN1, MLH1, MSH2, MSH3, MSH6, MUTYH, NBN, NF1, NF2, NTHL1, PALB2, PMS2, POLD1, POLE, PTEN, RAD51C, RAD51D, RECQL, RET, SDHA, SDHAF2, SDHB, SDHC, SDHD, SMAD4, SMARCA4, STK11, TP53, TSC1, TSC2, and VHL.  RNA data  is routinely analyzed for use in variant interpretation for all genes.   The test report has been scanned into EPIC and is located under the Molecular Pathology section of the Results Review tab.  A portion of the result report is included below for reference. Genetic testing reported out on December 28, 2021.       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  At this time, it is unknown if this variant is associated with increased cancer risk or if this is a normal finding, but most variants such as this get reclassified to being inconsequential. It should not be used to make medical management decisions. With time, we suspect the lab will determine the significance of this variant, if any. If we do learn more about it, we will try to contact Ms. Holts to discuss it further. However, it is important to stay in touch with [place] periodically and keep the address and phone number up to date.  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    Cancer Risks for BARD1: Female breast cancer, 20-40% risk   Management Recommendations:   Breast Cancer Screening/Risk Reduction: Annual mammogram and consider breast MRI with contrast starting at age 79 Evidence is insufficient for risk reducing mastectomy, manage based on family history   This information is based on current understanding of the gene  and may change in the future.   Implications for Family Members: Hereditary predisposition to cancer due to pathogenic variants in the BARD1 gene has autosomal dominant inheritance. This means that first degree relatives (parents, siblings, children) have a 50% chance of having the same mutation in BARD1. More distant relatives also have an increased chance for having the BARD1 mutation.  Identification of a  pathogenic variant allows for the recognition of at-risk relatives who can pursue testing for the familial variant.   Family members are encouraged to consider genetic testing for this familial pathogenic variant. It is unclear at this point in time of the mutation was maternally or paternally inherited; thus, both sides of Ms. Herren's family should seek appropriate counseling and testing. As there are generally no childhood cancer risks associated with pathogenic variants in the BARD1 gene, individuals in the family are not recommended to have testing until they reach at least 51 years of age. They may contact our office at [phone] for more information or to schedule an appointment.  Complimentary testing for the familial variant is available for 90 days after the report date.  Family members who live outside of the area are encouraged to find a genetic counselor in their area by visiting: [email or web address].   SUPPORT AND RESOURCES:  If Ms. Nicklin is interested in information and support, there are two groups, Facing Our Risk (www.facingourrisk.com) and Bright Pink (www.brightpink.org) which some people have found useful when identified with a hereditary risk for breast and ovarian cancer. They provide opportunities to speak with other individuals from high-risk families. To locate genetic counselors in other cities, visit the website of the [employer] ([employer].nl) and Financial controller for a Veterinary surgeon by zip code.   We encouraged Ms. Brackins to remain in contact with [place] on an annual basis so we can update his personal and family histories, and let him know of advances in cancer genetics that may benefit the family. Our contact number was provided. Ms. Decastro questions were answered to his satisfaction today, and he knows he is welcome to call anytime with additional questions.   PLAN: The genetic testing report will be sent to Ms. Congleton's  providers at the [facility] in order to coordinate future breast cancer screening.  High risk clinic providers at the [facility] are available to follow Ms. Buckle if needed.  A referral can be placed if interested.  A family letter will be mailed in order to help Ms. Shifflet communicate this result to family members and provide information on how to seek genetic testing.   Cary Lothrop M. Rennie Plowman, MS, [facility] Genetic Counselor [email or web address] (P) [phone]

## 2021-12-30 ENCOUNTER — Telehealth: Payer: Self-pay | Admitting: Pulmonary Disease

## 2021-12-30 NOTE — Telephone Encounter (Signed)
Spoke to patient.  She stated that orthodontist recommended that she have her wisdom teeth removed by a dentist in the hospital settings. Her dentist does not do this and [facility] stopped accepting new patients 68mo ago.  She is questioning if Dr. Isaiah Serge knows  of a dentist that would remove  wisdom teeth in a hospital setting?   Dr. Isaiah Serge please advise. Thanks

## 2022-01-03 ENCOUNTER — Ambulatory Visit: Payer: 59 | Admitting: Primary Care

## 2022-01-03 ENCOUNTER — Ambulatory Visit (INDEPENDENT_AMBULATORY_CARE_PROVIDER_SITE_OTHER): Payer: No Typology Code available for payment source | Admitting: Pulmonary Disease

## 2022-01-03 ENCOUNTER — Other Ambulatory Visit: Payer: Self-pay

## 2022-01-03 ENCOUNTER — Encounter: Payer: Self-pay | Admitting: Pulmonary Disease

## 2022-01-03 VITALS — BP 122/60 | HR 103 | Temp 98.4°F | Ht 67.0 in | Wt 207.0 lb

## 2022-01-03 DIAGNOSIS — U071 COVID-19: Secondary | ICD-10-CM | POA: Diagnosis not present

## 2022-01-03 DIAGNOSIS — J1282 Pneumonia due to coronavirus disease 2019: Secondary | ICD-10-CM

## 2022-01-03 DIAGNOSIS — J849 Interstitial pulmonary disease, unspecified: Secondary | ICD-10-CM | POA: Diagnosis not present

## 2022-01-03 DIAGNOSIS — Z5181 Encounter for therapeutic drug level monitoring: Secondary | ICD-10-CM

## 2022-01-03 MED ORDER — PREDNISONE 10 MG PO TABS: 10.0000 mg | ORAL_TABLET | Freq: Every day | ORAL | 0 refills | Status: DC

## 2022-01-03 NOTE — Progress Notes (Signed)
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  She was put on prednisone 40 mg and then CellCept which is titrated up to 1 g twice daily.  She was able to be weaned off prednisone in December 2021 with improvement in symptoms of dyspnea. She also follows with Dr. Gevena Mart, rheumatology at the [facility] and is on Plaquenil. She completed pulmonary rehab in 2021  States that her dyspnea on exertion has been stable on CellCept.  She is also on Bactrim for prophylaxis. Had echocardiogram in 2021 at the [place] with no evidence of pulmonary hypertension  She also has history of GERD and was on omeprazole but developed side effects of rash.  She is currently on pantoprazole 20 mg a day which appears to control her symptoms Postnasal drip was treated with over-the-counter antihistamines and nasal spray.  She was on chlorpheniramine which at least helped with her anxiety.  Pets: She is to have cats in the past.  No pets currently Occupation: Works for [employer].  Previously in the [employer] Exposures: Exposed to chemicals at [employer] camp in [place] Smoking history: Never smoker Travel history: No significant recent travel Relevant family history:No significant family history of lung disease  Interim History: She developed COVID-19 in January and treated as an outpatient with Paxlovid.  Subsequently she had treatment with Z-Pak and amoxicillin for persistent symptoms.  She continues to have cough,  nonproductive in nature, dyspnea on exertion  Outpatient Encounter Medications as of 01/03/2022  Medication Sig   amLODipine (NORVASC) 5 MG tablet Take 5 mg by mouth daily.   benzonatate (TESSALON) 200 MG capsule Take 1 capsule (200 mg total) by mouth 3 (three) times daily as needed for cough.   chlorpheniramine (CHLOR-TRIMETON) 4 MG tablet Take 4 mg by mouth 2 (two) times daily as needed for allergies.   clobetasol ointment (TEMOVATE) 0.05 % Apply 1 application topically 2 (two) times daily.   dorzolamide-timolol (COSOPT) 22.3-6.8 MG/ML ophthalmic solution 1 drop 2 (two) times daily.   HYDROcodone bit-homatropine (HYDROMET) 5-1.5 MG/5ML syrup Take 5 mLs by mouth at bedtime as needed for cough.   hydroxychloroquine (PLAQUENIL) 200 MG tablet Take 200 mg by mouth 2 (two) times daily.   latanoprost (XALATAN) 0.005 % ophthalmic solution Place 1 drop into both eyes at bedtime.   levocetirizine (XYZAL) 5 MG tablet Take 1 tablet (5 mg total) by mouth every evening.   mycophenolate (CELLCEPT) 500 MG tablet Take 500 mg by mouth 2 (two) times daily.   pantoprazole (PROTONIX) 40 MG tablet Take 40 mg by mouth daily.   predniSONE (DELTASONE) 10 MG tablet 4 tabs for 2 days, then 3 tabs for 2 days, 2 tabs for 2 days, then 1 tab for 2 days, then stop   sulfamethoxazole-trimethoprim (BACTRIM DS) 800-160 MG tablet Take 1 tablet by mouth 3 (three) times a week.   No facility-administered encounter medications on file as of 01/03/2022.    Physical Exam: Blood pressure 122/60, pulse (!) 103, temperature  98.4 F (36.9 C), temperature source Oral, height 5\' 7"  (1.702 m), weight 207 lb (93.9 kg), SpO2 99 %. Gen:      No acute distress HEENT:  EOMI, sclera anicteric Neck:     No masses; no thyromegaly Lungs:    Clear to auscultation bilaterally; normal respiratory effort CV:         Regular rate and rhythm; no murmurs Abd:      + bowel sounds; soft, non-tender; no palpable masses, no distension Ext:    No edema;  adequate peripheral perfusion Skin:      Warm and dry; no rash Neuro: alert and oriented x 3 Psych: normal mood and affect   Data Reviewed: Reviewed data from the PFTs 2015-FVC 3.05 [79%], TLC 4.39 [80%], DLCO 65% 02/2020-FVC 2.47 [72%], TLC 3.39 [60%], DLCO 42%  CT chest 03/2020-baseline ILD with subpleural sparing, groundglass opacities with traction bronchiectasis.  Cysts versus honeycombing at the base.  Worsening compared to 2019  Echocardiogram May 2021-no pulmonary hypertension, positive for diastolic dysfunction  Imaging: High-res CT 09/14/2021-probable UIP pattern interstitial pneumonia, soft tissue mass in the anterior mediastinum, low-attenuation lesion in the liver.  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  She is off prednisone since 2021 Continues on Bactrim for prophylaxis  I will get records from her rheumatologist at the [facility] CT reviewed with probable UIP pattern scarring.  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  Increase CellCept to to 1.5 g when she is recovered from recent COVID infection If there is progression then consider antifibrotics.  She may be a good transplant candidate as well.  Post COVID-19 Continues to have persistent cough after her COVID infection encouraged Get high-res CT Start prednisone 40 mg a day.  Reduce dose by 10 mg until she reaches dose of 20 mg and continue at that dose until you are reevaluated.  Chronic cough Felt to be multifactorial from interstitial lung disease,  GERD, postnasal drip Continue PPI, over-the-counter antihistamine  Liver lesions Noted on high-res CT Follow-up MRI shows benign hemangiomas  Plan/Recommendations: Continue CellCept, Bactrim, Plaquenil High-res CT, prednisone Obtain records from rheumatology  [facility] MD [facility] 01/03/2022, 9:24 AM  CC: 01/05/2022, MD

## 2022-01-03 NOTE — Patient Instructions (Signed)
We will schedule you for high-resolution CT for better evaluation of the lungs as he continues to cough Start prednisone 40 mg a day.  Reduce dose by 10 mg every week until he reaches a dose of 20 mg.  Continue at that dose until you are reevaluated Follow-up in 1 month.  Video visit okay.

## 2022-01-04 ENCOUNTER — Encounter: Payer: Self-pay | Admitting: Pulmonary Disease

## 2022-02-07 ENCOUNTER — Ambulatory Visit (INDEPENDENT_AMBULATORY_CARE_PROVIDER_SITE_OTHER): Payer: No Typology Code available for payment source | Admitting: Pulmonary Disease

## 2022-02-07 ENCOUNTER — Encounter: Payer: Self-pay | Admitting: Pulmonary Disease

## 2022-02-07 VITALS — BP 116/70 | HR 78 | Ht 67.0 in | Wt 206.0 lb

## 2022-02-07 DIAGNOSIS — J849 Interstitial pulmonary disease, unspecified: Secondary | ICD-10-CM | POA: Diagnosis not present

## 2022-02-07 DIAGNOSIS — Z5181 Encounter for therapeutic drug level monitoring: Secondary | ICD-10-CM | POA: Diagnosis not present

## 2022-02-07 LAB — CBC WITH DIFFERENTIAL/PLATELET
Basophils Absolute: 0 10*3/uL (ref 0.0–0.1)
Basophils Relative: 0.8 % (ref 0.0–3.0)
Eosinophils Absolute: 0 10*3/uL (ref 0.0–0.7)
Eosinophils Relative: 0.5 % (ref 0.0–5.0)
HCT: 38 % (ref 36.0–46.0)
Hemoglobin: 12.5 g/dL (ref 12.0–15.0)
Lymphocytes Relative: 23.5 % (ref 12.0–46.0)
Lymphs Abs: 0.9 10*3/uL (ref 0.7–4.0)
MCHC: 32.8 g/dL (ref 30.0–36.0)
MCV: 90.3 fl (ref 78.0–100.0)
Monocytes Absolute: 0.4 10*3/uL (ref 0.1–1.0)
Monocytes Relative: 9.8 % (ref 3.0–12.0)
Neutro Abs: 2.4 10*3/uL (ref 1.4–7.7)
Neutrophils Relative %: 65.4 % (ref 43.0–77.0)
Platelets: 305 10*3/uL (ref 150.0–400.0)
RBC: 4.21 Mil/uL (ref 3.87–5.11)
RDW: 13.5 % (ref 11.5–15.5)
WBC: 3.7 10*3/uL — ABNORMAL LOW (ref 4.0–10.5)

## 2022-02-07 MED ORDER — OMEPRAZOLE 40 MG PO CPDR: 40.0000 mg | DELAYED_RELEASE_CAPSULE | Freq: Every day | ORAL | 5 refills | Status: DC

## 2022-02-07 MED ORDER — ESOMEPRAZOLE MAGNESIUM 40 MG PO CPDR: 40.0000 mg | DELAYED_RELEASE_CAPSULE | Freq: Every day | ORAL | 5 refills | Status: DC

## 2022-02-07 NOTE — Addendum Note (Signed)
Addended by: Jacquiline Doe on: 02/07/2022 03:42 PM ? ? Modules accepted: Orders ? ?

## 2022-02-07 NOTE — Patient Instructions (Signed)
Status Meprazole 40 mg once daily for acid reflux ?Check CBC ?High-res CT in 3 months and follow-up in clinic. ?

## 2022-02-07 NOTE — Progress Notes (Signed)
? ?      ?Chelsea Jennings    [id number]    [birth year] ? ?Primary Care Physician:Hicks, Sherin Quarry, MD ? ?Referring Physician: Agustina Caroli, MD ?[phone] ?[place],  [place] [address] ? 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 ?HPI: ?51 year old with history of hypertension, unspecified connective tissue disease, NSIP interstitial lung disease here to establish care ?She was previously followed by Dr.  Shelle Iron at the [facility] who has recently retired ? 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  She was put on prednisone 40 mg and then CellCept which is titrated up to 1 g twice daily.  She was able to be weaned off prednisone in December 2021 with improvement in symptoms of dyspnea. ?She also follows with Dr. Gevena Mart, rheumatology at the [facility] and is on Plaquenil. ?She completed pulmonary rehab in 2021 ? ?States that her dyspnea on exertion has been stable on CellCept.  She is also on Bactrim for prophylaxis. Had echocardiogram in 2021 at the [place] with no evidence of pulmonary hypertension ? ?She also has history of GERD and was on omeprazole but developed side effects of rash.  She is currently on pantoprazole 20 mg a day which appears to control her symptoms ?Postnasal drip was treated with over-the-counter antihistamines and nasal spray.  She was on chlorpheniramine which at least helped with her anxiety. ? ?She developed COVID-19 in January 23 and treated as an outpatient with Paxlovid.  Subsequently she had treatment with Z-Pak and amoxicillin for persistent symptoms.   ? ?Pets: She is to have cats in the past.  No pets currently ?Occupation: Works for [employer].  Previously in the [employer] ?Exposures: Exposed to chemicals at [employer] camp in [place] ?Smoking history: Never smoker ?Travel history: No significant recent travel ?Relevant family history:No significant family history of lung disease ? ?Interim History: ?Continues on slow  prednisone taper.  She is now at 10 mg/day.  States that cough is much improved and breathing is improved as well. ? ?Outpatient Encounter Medications as of 02/07/2022  ?Medication Sig  ? amLODipine (NORVASC) 5 MG tablet Take 5 mg by mouth daily.  ? chlorpheniramine (CHLOR-TRIMETON) 4 MG tablet Take 4 mg by mouth 2 (two) times daily as needed for allergies.  ? clobetasol ointment (TEMOVATE) 0.05 % Apply 1 application topically 2 (two) times daily.  ? dorzolamide-timolol (COSOPT) 22.3-6.8 MG/ML ophthalmic solution 1 drop 2 (two) times daily.  ? hydroxychloroquine (PLAQUENIL) 200 MG tablet Take 200 mg by mouth 2 (two) times daily.  ? latanoprost (XALATAN) 0.005 % ophthalmic solution Place 1 drop into both eyes at bedtime.  ? levocetirizine (XYZAL) 5 MG tablet Take 1 tablet (5 mg total) by mouth every evening.  ? mycophenolate (CELLCEPT) 500 MG tablet Take 500 mg by mouth 2 (two) times daily.  ? pantoprazole (PROTONIX) 40 MG tablet Take 40 mg by mouth daily.  ? predniSONE (DELTASONE) 10 MG tablet Take 1 tablet (10 mg total) by mouth daily with breakfast.  ? sulfamethoxazole-trimethoprim (BACTRIM DS) 800-160 MG tablet Take 1 tablet by mouth 3 (three) times a week.  ? benzonatate (TESSALON) 200 MG capsule Take 1 capsule (200 mg total) by mouth 3 (three) times daily as needed for cough. (Patient not taking: Reported on 02/07/2022)  ? HYDROcodone bit-homatropine (HYDROMET) 5-1.5 MG/5ML syrup Take 5 mLs by mouth at bedtime as needed for cough. (Patient not taking: Reported on 02/07/2022)  ? ?No facility-administered encounter medications on  file as of 02/07/2022.  ? ? ?Physical Exam: ?Blood pressure 116/70, pulse 78, height 5\' 7"  (1.702 m), weight 206 lb (93.4 kg), SpO2 100 %. ?Gen:      No acute distress ?HEENT:  EOMI, sclera anicteric ?Neck:     No masses; no thyromegaly ?Lungs:    Clear to auscultation bilaterally; normal respiratory effort ?CV:         Regular rate and rhythm; no murmurs ?Abd:      + bowel sounds; soft,  non-tender; no palpable masses, no distension ?Ext:    No edema; adequate peripheral perfusion ?Skin:      Warm and dry; no rash ?Neuro: alert and oriented x 3 ?Psych: normal mood and affect  ? ?Data Reviewed: ?Reviewed data from the ?PFTs ?2015-FVC 3.05 [79%], TLC 4.39 [80%], DLCO 65% ?02/2020-FVC 2.47 [72%], TLC 3.39 [60%], DLCO 42% ? ?CT chest 03/2020-baseline ILD with subpleural sparing, groundglass opacities with traction bronchiectasis.  Cysts versus honeycombing at the base.  Worsening compared to 2019 ? ?Echocardiogram May 2021-no pulmonary hypertension, positive for diastolic dysfunction ? ?Imaging: ?High-res CT 09/14/2021-probable UIP pattern interstitial pneumonia, soft tissue mass in the anterior mediastinum, low-attenuation lesion in the liver. ? ?MRI abdomen 10/13/2021-benign hepatic hemangiomas in the liver ? ?Chest x-ray 11/30/2021-bilateral infiltrates consistent with chronic interstitial lung disease. ?I have reviewed the images personally. ? ?PFTs: ?11/10/2021 ?FVC 2.55 [78%], FEV1 2.20 [84%], F/F 86, TLC 3.75 [68%], DLCO 11.22 [48%] ?Mild restriction, moderate-severe diffusion defect ? ?Labs: ?CMP 08/24/2021-within normal limits ?CBC significant for WBC count of 2.8 ?CTD serologies 08/24/2021-ANA 1:1280 ? 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  She is off prednisone since 2021.  Now back on it due to post-COVID inflammation ?Continues on Bactrim for prophylaxis ? ?We are still awaiting records from her rheumatologist at the [facility] ?High-res CT reviewed with probable UIP pattern scarring.  She has had a thymic mass but no evidence of myasthenia gravis ?Continue CellCept Bactrim and Plaquenil for now.  Recheck CBC and if stable increase CellCept to to 1.5 g  ?Follow-up high-res CT in 3 months ?If there is progression then consider antifibrotics.  She may be a good transplant candidate as well. ? ?Post COVID-19 ?On slow prednisone taper.   Currently at 10 mg/day.  She will reduce to 5 mg for a month and then taper to off ? ?Chronic cough ?Felt to be multifactorial from interstitial lung disease, GERD, postnasal drip ?Continue PPI, over-the-counter antihistamine ? ?She says that pantoprazole and omeprazole is no longer working.  We will try esomeprazole ? ?Liver lesions ?Noted on high-res CT ?Follow-up MRI shows benign hemangiomas ? ?Plan/Recommendations: ?Continue CellCept, Bactrim, Plaquenil ?Check CBC ?Prednisone taper ?Prescribe esomeprazole for acid reflux ? ?[facility] MD ?[facility] ?02/07/2022, 2:23 PM ? ?CC: 04/09/2022, MD ? ?  ?

## 2022-02-09 ENCOUNTER — Other Ambulatory Visit: Payer: Self-pay | Admitting: *Deleted

## 2022-02-09 MED ORDER — MYCOPHENOLATE MOFETIL 500 MG PO TABS: 1500.0000 mg | ORAL_TABLET | Freq: Two times a day (BID) | ORAL | 5 refills | Status: DC

## 2022-02-15 NOTE — Telephone Encounter (Signed)
Since call to the office, pt did have an OV with Dr. Isaiah Serge. Nothing further needed. ?

## 2022-04-07 ENCOUNTER — Telehealth: Payer: Self-pay | Admitting: Pulmonary Disease

## 2022-04-08 NOTE — Telephone Encounter (Signed)
Called and spoke [facility] with [facility].  Cellcept 1.5g twice daily prescription dose clarified.  Nothing further at this time.  Per Dr. Isaiah Serge 02/09/22- Labs are stable.  Please increase prescription for CellCept to 1.5 g twice daily.   Message sent to patient via MyChart

## 2022-05-01 ENCOUNTER — Other Ambulatory Visit: Payer: Self-pay | Admitting: Pulmonary Disease

## 2022-05-11 ENCOUNTER — Ambulatory Visit (HOSPITAL_BASED_OUTPATIENT_CLINIC_OR_DEPARTMENT_OTHER): Payer: No Typology Code available for payment source

## 2022-05-11 ENCOUNTER — Ambulatory Visit (HOSPITAL_BASED_OUTPATIENT_CLINIC_OR_DEPARTMENT_OTHER)
Admission: RE | Admit: 2022-05-11 | Discharge: 2022-05-11 | Disposition: A | Discharge status: Home / Self Care | Payer: No Typology Code available for payment source | Source: Ambulatory Visit | Attending: Pulmonary Disease | Admitting: Pulmonary Disease

## 2022-05-11 DIAGNOSIS — J849 Interstitial pulmonary disease, unspecified: Secondary | ICD-10-CM | POA: Diagnosis present

## 2022-05-17 ENCOUNTER — Encounter: Payer: Self-pay | Admitting: Pulmonary Disease

## 2022-05-17 ENCOUNTER — Ambulatory Visit (INDEPENDENT_AMBULATORY_CARE_PROVIDER_SITE_OTHER): Payer: No Typology Code available for payment source | Admitting: Pulmonary Disease

## 2022-05-17 VITALS — BP 132/64 | HR 91 | Ht 67.0 in | Wt 208.8 lb

## 2022-05-17 DIAGNOSIS — J849 Interstitial pulmonary disease, unspecified: Secondary | ICD-10-CM

## 2022-05-17 DIAGNOSIS — Z5181 Encounter for therapeutic drug level monitoring: Secondary | ICD-10-CM | POA: Diagnosis not present

## 2022-05-17 DIAGNOSIS — R0602 Shortness of breath: Secondary | ICD-10-CM | POA: Diagnosis not present

## 2022-05-17 NOTE — Progress Notes (Signed)
Chelsea Jennings    [id number]    09/17/1971  Primary Care Physician:Hicks, Sherin Quarry, MD  Referring Physician: Agustina Caroli, MD [address] 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  She was put on prednisone 40 mg and then CellCept which is titrated up to 1 g twice daily.  She was able to be weaned off prednisone in December 2021 with improvement in symptoms of dyspnea. She also follows with Dr. Gevena Mart, rheumatology at the [facility] and is on Plaquenil. She completed pulmonary rehab in 2021  States that her dyspnea on exertion has been stable on CellCept.  She is also on Bactrim for prophylaxis. Had echocardiogram in 2021 at the [place] with no evidence of pulmonary hypertension  She also has history of GERD and was on omeprazole but developed side effects of rash.  She is currently on pantoprazole 20 mg a day which appears to control her symptoms Postnasal drip was treated with over-the-counter antihistamines and nasal spray.  She was on chlorpheniramine which at least helped with her anxiety.  She developed COVID-19 in January 23 and treated as an outpatient with Paxlovid.  Subsequently she had treatment with Z-Pak and amoxicillin for persistent symptoms.    Pets: She is to have cats in the past.  No pets currently Occupation: Works for [employer].  Previously in the [employer] Exposures: Exposed to chemicals at [employer] camp in [place] Smoking history: Never smoker Travel history: No significant recent travel Relevant family history:No significant family history of lung disease  Interim History: She has finished her  prednisone taper and is feeling well with no issues She is here for review of recent CT scan  Outpatient Encounter Medications as of 05/17/2022  Medication Sig   amLODipine (NORVASC) 5 MG tablet Take 5 mg by mouth daily.   benzonatate (TESSALON) 200 MG capsule Take 1 capsule (200 mg total) by mouth 3 (three) times daily as needed for cough.   chlorpheniramine (CHLOR-TRIMETON) 4 MG tablet Take 4 mg by mouth 2 (two) times daily as needed for allergies.   clobetasol ointment (TEMOVATE) 0.05 % Apply 1 application topically 2 (two) times daily.   dorzolamide-timolol (COSOPT) 22.3-6.8 MG/ML ophthalmic solution 1 drop 2 (two) times daily.   esomeprazole (NEXIUM) 40 MG capsule Take 1 capsule (40 mg total) by mouth daily at 12 noon.   HYDROcodone bit-homatropine (HYDROMET) 5-1.5 MG/5ML syrup Take 5 mLs by mouth at bedtime as needed for cough.   hydroxychloroquine (PLAQUENIL) 200 MG tablet Take 200 mg by mouth 2 (two) times daily.   latanoprost (XALATAN) 0.005 % ophthalmic solution Place 1 drop into both eyes at bedtime.   levocetirizine (XYZAL) 5 MG tablet TAKE 1 TABLET BY MOUTH EVERY DAY IN THE EVENING   mycophenolate (CELLCEPT) 500 MG tablet Take 3 tablets (1,500 mg total) by mouth 2 (two) times daily.   predniSONE (DELTASONE) 10 MG tablet Take 1 tablet (10 mg total) by mouth daily with breakfast.   sulfamethoxazole-trimethoprim (BACTRIM DS) 800-160 MG tablet Take 1 tablet by mouth 3 (three) times a week.   No facility-administered encounter medications on file as of 05/17/2022.  Physical Exam: Blood pressure 132/64, pulse 91, height 5\' 7"  (1.702 m), weight 208 lb 12.8 oz (94.7 kg), SpO2 100 %. Gen:      No acute distress HEENT:  EOMI, sclera anicteric Neck:     No masses; no thyromegaly Lungs:    Clear to auscultation bilaterally; normal respiratory effort CV:         Regular rate and rhythm; no murmurs Abd:      + bowel sounds; soft, non-tender; no palpable masses, no distension Ext:    No  edema; adequate peripheral perfusion Skin:      Warm and dry; no rash Neuro: alert and oriented x 3 Psych: normal mood and affect   Data Reviewed: Reviewed data from the PFTs 2015-FVC 3.05 [79%], TLC 4.39 [80%], DLCO 65% 02/2020-FVC 2.47 [72%], TLC 3.39 [60%], DLCO 42%  CT chest 03/2020-baseline ILD with subpleural sparing, groundglass opacities with traction bronchiectasis.  Cysts versus honeycombing at the base.  Worsening compared to 2019  Echocardiogram May 2021-no pulmonary hypertension, positive for diastolic dysfunction  Imaging: High-res CT 09/14/2021-probable UIP pattern interstitial pneumonia, soft tissue mass in the anterior mediastinum, low-attenuation lesion in the liver.  MRI abdomen 10/13/2021-benign hepatic hemangiomas in the liver  CT chest 05/11/2022-Mild progression of interstitial lung disease with suggestion of honeycombing. I have reviewed the images personally.  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  She is off prednisone since 2021.  We will just place back on it due to post-COVID inflammation.  She has tapered off prednisone in June 2023.    Currently on CellCept at 1.5 g, Bactrim and Plaquenil We are still awaiting records from her rheumatologist at the [facility] I have reviewed her latest high-res CT from this month with her which unfortunately shows mild progression in honeycombing changes.  We had an extensive discussion today and decided to initiate antifibrotic therapy with Ofev.  She may be a good transplant candidate as well.  Post COVID-19 Suspect COVID may have set off progression of her ILD.  She is off prednisone.  Starting  antifibrotic's as above  Chronic cough Felt to be multifactorial from interstitial lung disease, GERD, postnasal drip Continue PPI, over-the-counter antihistamine  Liver lesions Noted on high-res CT Follow-up MRI shows benign hemangiomas  Plan/Recommendations: Continue CellCept, Bactrim, Plaquenil Check CBC, metabolic panel and proBNP Start Ofev  [facility] MD [facility] 05/17/2022, 4:36 PM  CC: 07/18/2022, MD

## 2022-05-17 NOTE — Patient Instructions (Signed)
I have reviewed your CT scan which shows slight progression of interstitial lung disease Continue the CellCept at current dose We will check some labs today including CBC, competence of metabolic panel and proBNP for dyspnea We will start you on a medication called Ofev Follow-up in 3 months.

## 2022-05-18 LAB — CBC WITH DIFFERENTIAL/PLATELET
Basophils Absolute: 0 10*3/uL (ref 0.0–0.1)
Basophils Relative: 1 % (ref 0.0–3.0)
Eosinophils Absolute: 0 10*3/uL (ref 0.0–0.7)
Eosinophils Relative: 0.4 % (ref 0.0–5.0)
HCT: 39 % (ref 36.0–46.0)
Hemoglobin: 12.6 g/dL (ref 12.0–15.0)
Lymphocytes Relative: 26.3 % (ref 12.0–46.0)
Lymphs Abs: 1.1 10*3/uL (ref 0.7–4.0)
MCHC: 32.4 g/dL (ref 30.0–36.0)
MCV: 90.4 fl (ref 78.0–100.0)
Monocytes Absolute: 0.4 10*3/uL (ref 0.1–1.0)
Monocytes Relative: 10.9 % (ref 3.0–12.0)
Neutro Abs: 2.5 10*3/uL (ref 1.4–7.7)
Neutrophils Relative %: 61.4 % (ref 43.0–77.0)
Platelets: 294 10*3/uL (ref 150.0–400.0)
RBC: 4.31 Mil/uL (ref 3.87–5.11)
RDW: 12.9 % (ref 11.5–15.5)
WBC: 4.1 10*3/uL (ref 4.0–10.5)

## 2022-05-18 LAB — COMPREHENSIVE METABOLIC PANEL
ALT: 13 U/L (ref 0–35)
AST: 17 U/L (ref 0–37)
Albumin: 4.5 g/dL (ref 3.5–5.2)
Alkaline Phosphatase: 57 U/L (ref 39–117)
BUN: 9 mg/dL (ref 6–23)
CO2: 30 mEq/L (ref 19–32)
Calcium: 10.2 mg/dL (ref 8.4–10.5)
Chloride: 102 mEq/L (ref 96–112)
Creatinine, Ser: 0.83 mg/dL (ref 0.40–1.20)
GFR: 81.91 mL/min (ref >60.00)
Glucose, Bld: 85 mg/dL (ref 70–99)
Potassium: 4 mEq/L (ref 3.5–5.1)
Sodium: 138 mEq/L (ref 135–145)
Total Bilirubin: 0.4 mg/dL (ref 0.2–1.2)
Total Protein: 8.2 g/dL (ref 6.0–8.3)

## 2022-05-18 LAB — PRO B NATRIURETIC PEPTIDE: NT-Pro BNP: <36 pg/mL (ref 0–249)

## 2022-05-19 ENCOUNTER — Telehealth: Payer: Self-pay | Admitting: Pharmacist

## 2022-05-19 MED ORDER — OFEV 150 MG PO CAPS: 150.0000 mg | ORAL_CAPSULE | Freq: Two times a day (BID) | ORAL | 1 refills | Status: DC

## 2022-05-19 NOTE — Telephone Encounter (Addendum)
Received new start paperwork for Ofev. Patient is a [facility] patient.  Rx for Ofev 150mg  twice daily e-scribed to [facility]. Clinicals faxed to [facility] pharmacy for review.  Phone: [phone], ext [phone] Fax: [phone]  Left VM with patient advising of this and requested return call once she received medication at home so I can provide detailed counseling.  [facility] pt assistance application placed in PAP pending info in case there are any barriers to pt [facility]  [phone], PharmD, MPH, BCPS, CPP Clinical Pharmacist (Rheumatology and Pulmonology)

## 2022-05-21 ENCOUNTER — Encounter: Payer: Self-pay | Admitting: Pulmonary Disease

## 2022-05-30 ENCOUNTER — Telehealth: Payer: Self-pay | Admitting: Pharmacist

## 2022-05-30 DIAGNOSIS — J849 Interstitial pulmonary disease, unspecified: Secondary | ICD-10-CM

## 2022-05-30 DIAGNOSIS — Z5181 Encounter for therapeutic drug level monitoring: Secondary | ICD-10-CM

## 2022-05-30 NOTE — Telephone Encounter (Addendum)
Received VM from patient - she states she received Ofev from [facility]. Left VM for patient to provide counseling in separate encounter. Will follow-up  Chesley Mires, PharmD, MPH, BCPS, CPP Clinical Pharmacist (Rheumatology and Pulmonology)

## 2022-05-30 NOTE — Telephone Encounter (Signed)
ATC patient to provide Ofev counseling. Left VM requesting return call  Chesley Mires, PharmD, MPH, BCPS, CPP Clinical Pharmacist (Rheumatology and Pulmonology)

## 2022-06-01 NOTE — Telephone Encounter (Signed)
Subjective:  Patient called today by [facility] pharmacy team for Ofev new start counseling.   Patient was last seen by Dr. [name] on 05/17/22. Pertinent past medical history includes CTD-ILD (unspecified CTD). Her rheumatologist is with the [facility]. She takes hydroxychloroquine 278m twice daily. She also takes mycophenolate 1500 mg twice daily with Bactrim DS three times weekly for PJP prophylaxis.  She is naive to antifibrotics.  [place] and is concerned about diarrhea occurring while traveling.  History of CAD: No History of MI: No Current anticoagulant use: No History of HTN: No  History of elevated LFTs: No History of diarrhea, nausea, vomiting: No  Objective: Allergies  Allergen Reactions   Famotidine Anxiety and Palpitations    Outpatient Encounter Medications as of 05/30/2022  Medication Sig   amLODipine (NORVASC) 5 MG tablet Take 5 mg by mouth daily.   benzonatate (TESSALON) 200 MG capsule Take 1 capsule (200 mg total) by mouth 3 (three) times daily as needed for cough.   chlorpheniramine (CHLOR-TRIMETON) 4 MG tablet Take 4 mg by mouth 2 (two) times daily as needed for allergies.   clobetasol ointment (TEMOVATE) 06.38% Apply 1 application topically 2 (two) times daily.   dorzolamide-timolol (COSOPT) 22.3-6.8 MG/ML ophthalmic solution 1 drop 2 (two) times daily.   esomeprazole (NEXIUM) 40 MG capsule Take 1 capsule (40 mg total) by mouth daily at 12 noon.   HYDROcodone bit-homatropine (HYDROMET) 5-1.5 MG/5ML syrup Take 5 mLs by mouth at bedtime as needed for cough.   hydroxychloroquine (PLAQUENIL) 200 MG tablet Take 200 mg by mouth 2 (two) times daily.   latanoprost (XALATAN) 0.005 % ophthalmic solution Place 1 drop into both eyes at bedtime.   levocetirizine (XYZAL) 5 MG tablet TAKE 1 TABLET BY MOUTH EVERY DAY IN THE EVENING   mycophenolate (CELLCEPT) 500 MG tablet Take 3 tablets (1,500 mg total) by  mouth 2 (two) times daily.   Nintedanib (OFEV) 150 MG CAPS Take 1 capsule (150 mg total) by mouth 2 (two) times daily.   predniSONE (DELTASONE) 10 MG tablet Take 1 tablet (10 mg total) by mouth daily with breakfast.   sulfamethoxazole-trimethoprim (BACTRIM DS) 800-160 MG tablet Take 1 tablet by mouth 3 (three) times a week.   No facility-administered encounter medications on file as of 05/30/2022.     Immunization History  Administered Date(s) Administered   Influenza,inj,Quad PF,6+ Mos 09/02/2021   Moderna Sars-Covid-2 Vaccination 12/12/2019, 01/09/2020, 08/07/2020, 04/13/2021   Pneumococcal Conjugate-13 01/13/2015, 04/23/2020   Pneumococcal Polysaccharide-23 05/07/2013, 08/29/2013      PFT's TLC  Date Value Ref Range Status  11/10/2021 3.75 L Final      CMP     Component Value Date/Time   NA 138 05/17/2022 1650   K 4.0 05/17/2022 1650   CL 102 05/17/2022 1650   CO2 30 05/17/2022 1650   GLUCOSE 85 05/17/2022 1650   BUN 9 05/17/2022 1650   CREATININE 0.83 05/17/2022 1650   CALCIUM 10.2 05/17/2022 1650   PROT 8.2 05/17/2022 1650   ALBUMIN 4.5 05/17/2022 1650   AST 17 05/17/2022 1650   ALT 13 05/17/2022 1650   ALKPHOS 57 05/17/2022 1650   BILITOT 0.4 05/17/2022 1650      CBC    Component Value Date/Time   WBC 4.1 05/17/2022 1650   RBC 4.31 05/17/2022 1650   HGB 12.6 05/17/2022 1650   HCT 39.0 05/17/2022 1650   PLT 294.0 05/17/2022 1650   MCV  90.4 05/17/2022 1650   MCHC 32.4 05/17/2022 1650   RDW 12.9 05/17/2022 1650   LYMPHSABS 1.1 05/17/2022 1650   MONOABS 0.4 05/17/2022 1650   EOSABS 0.0 05/17/2022 1650   BASOSABS 0.0 05/17/2022 1650      LFT's    Latest Ref Rng & Units 05/17/2022    4:50 PM 08/24/2021    9:47 AM  Hepatic Function  Total Protein 6.0 - 8.3 g/dL 8.2  7.7   Albumin 3.5 - 5.2 g/dL 4.5  4.2   AST 0 - 37 U/L 17  17   ALT 0 - 35 U/L 13  26   Alk Phosphatase 39 - 117 U/L 57  59   Total Bilirubin 0.2 - 1.2 mg/dL 0.4  0.5     HRCT  (05/12/22) - Mild progression of interstitial lung disease which is again unusual in appearance, but given the spectrum of findings and the apparent mild progression compared to the prior examination is highly suspicious for usual interstitial pneumonia (UIP) per current ATS guidelines. Given the persistent subpleural sparing, the possibility of an alternative diagnosis such as fibrotic phase nonspecific interstitial pneumonia (NSIP) remains a consideration. Correlation with lung biopsy could be considered if clinically appropriate.  Assessment and Plan  Ofev Medication Management Thoroughly counseled patient on the efficacy, mechanism of action, dosing, administration, adverse effects, and monitoring parameters of Ofev. Patient verbalized understanding.   Goals of Therapy: Will not stop or reverse the progression of ILD. It will slow the progression of ILD.  Inhibits tyrosine kinase inhibitors which slow the fibrosis/progression of ILD -Significant reduction in the rate of disease progression was observed after treatment (61.1% [before] vs 33.3% [after], P?=?0.008) over 42 weeks.  Dosing: 150 mg (one capsule) by mouth twice daily (approx 12 hours apart). Discussed taking with food approximately 12 hours apart. Discussed that capsule should not be crushed or split.  Adverse Effects: Nausea, vomiting, diarrhea (2 in 3 patients), appetite loss, weight loss - management of diarrhea with loperamide discussed including max use of 48 hours and max of 8 capsules per day. Abdominal pain (up to 1 in 5 patients) Nasopharyngitis (13%), UTI (6%) Risk of thrombosis (3%) and acute MI (2%) Hypertension (5%) Dizziness Fatigue (10%)  Monitoring: Monitor for diarrhea, nausea and vomiting, GI perforation, hepatotoxicity  Monitor LFTs - baseline, monthly for first 6 months, then every 3 months routinely CBC w differential at baseline and every 3 months routinely Order for CMET placed.  Access: Approval of  Ofev through: insurance Rx sent to:  [facility] [phone]) She has received 3 month supply at home.  She will plan to start after 06/17/22 when she will be back from [place] to prevent risk for diarrhea with Ofev while traveling internationally.  Medication Reconciliation A drug regimen assessment was performed, including review of allergies, interactions, disease-state management, dosing and immunization history. Medications were reviewed with the patient, including name, instructions, indication, goals of therapy, potential side effects, importance of adherence, and safe use.  Anticoagulant use: No  This appointment required 30 minutes of patient care (this includes precharting, chart review, review of results, face-to-face care, etc.).  Thank you for involving pharmacy to assist in providing this patient's care.   Knox Saliva, PharmD, MPH, BCPS, CPP Clinical Pharmacist (Rheumatology and Pulmonology)

## 2022-06-03 ENCOUNTER — Telehealth: Payer: Self-pay | Admitting: Pulmonary Disease

## 2022-06-03 MED ORDER — SULFAMETHOXAZOLE-TRIMETHOPRIM 800-160 MG PO TABS: 1.0000 | ORAL_TABLET | ORAL | 0 refills | Status: DC

## 2022-06-03 MED ORDER — ESOMEPRAZOLE MAGNESIUM 40 MG PO CPDR: 40.0000 mg | DELAYED_RELEASE_CAPSULE | Freq: Every day | ORAL | 5 refills | Status: DC

## 2022-06-03 MED ORDER — MYCOPHENOLATE MOFETIL 500 MG PO TABS: 1500.0000 mg | ORAL_TABLET | Freq: Two times a day (BID) | ORAL | 5 refills | Status: DC

## 2022-06-03 NOTE — Telephone Encounter (Signed)
Okay to refill 90-day supply of Bactrim.

## 2022-06-03 NOTE — Telephone Encounter (Signed)
Called and spoke to patient and went over the medications tt she wanted refilled. Verified pharmacy. Nothing further needed

## 2022-06-03 NOTE — Telephone Encounter (Signed)
Called patient and she states that she normally gets a 90 days supply of Bactrim but she only got 30 days sent in. I told patient that I would verify with doctor about refill.    Please advise sir

## 2022-06-03 NOTE — Telephone Encounter (Signed)
Called and informed patient that I would have copy of her PFT and CT scan for her on Monday for her. Patient verbalized understanding. Nothing further needed

## 2022-06-15 ENCOUNTER — Other Ambulatory Visit: Payer: Self-pay | Admitting: *Deleted

## 2022-06-15 ENCOUNTER — Telehealth: Payer: Self-pay | Admitting: Pulmonary Disease

## 2022-06-15 MED ORDER — MYCOPHENOLATE MOFETIL 500 MG PO TABS: 1500.0000 mg | ORAL_TABLET | Freq: Two times a day (BID) | ORAL | 0 refills | Status: DC

## 2022-06-15 NOTE — Telephone Encounter (Signed)
Called and spoke with patient, she is in [place], [place] and she forgot to take her Mycophenolate with her.  She just got a refill and just needs a 7 day supply, she returns  home Monday.  Verified pharmacy she wanted [facility] [address] [place], [place] [address].  Script sent to pharmacy.  Nothing further needed.

## 2022-06-22 ENCOUNTER — Telehealth: Payer: Self-pay | Admitting: Pharmacist

## 2022-06-22 NOTE — Telephone Encounter (Signed)
Received call from patient while I was OOO requesting rx for mycophenolate be sent to [facility] in [place].  Returned call to patient today - left VM requesting return call.  Chesley Mires, PharmD, MPH, BCPS, CPP Clinical Pharmacist (Rheumatology and Pulmonology)

## 2022-08-12 ENCOUNTER — Encounter: Payer: Self-pay | Admitting: Nurse Practitioner

## 2022-08-12 ENCOUNTER — Ambulatory Visit (INDEPENDENT_AMBULATORY_CARE_PROVIDER_SITE_OTHER): Payer: No Typology Code available for payment source | Admitting: Nurse Practitioner

## 2022-08-12 VITALS — BP 124/80 | HR 74 | Temp 98.0°F | Ht 67.0 in | Wt 208.0 lb

## 2022-08-12 DIAGNOSIS — J849 Interstitial pulmonary disease, unspecified: Secondary | ICD-10-CM

## 2022-08-12 DIAGNOSIS — Z5181 Encounter for therapeutic drug level monitoring: Secondary | ICD-10-CM

## 2022-08-12 LAB — COMPREHENSIVE METABOLIC PANEL
ALT: 11 U/L (ref 0–35)
AST: 12 U/L (ref 0–37)
Albumin: 4.4 g/dL (ref 3.5–5.2)
Alkaline Phosphatase: 56 U/L (ref 39–117)
BUN: 11 mg/dL (ref 6–23)
CO2: 28 mEq/L (ref 19–32)
Calcium: 10.1 mg/dL (ref 8.4–10.5)
Chloride: 104 mEq/L (ref 96–112)
Creatinine, Ser: 0.84 mg/dL (ref 0.40–1.20)
GFR: 80.61 mL/min (ref >60.00)
Glucose, Bld: 95 mg/dL (ref 70–99)
Potassium: 4.3 mEq/L (ref 3.5–5.1)
Sodium: 141 mEq/L (ref 135–145)
Total Bilirubin: 0.5 mg/dL (ref 0.2–1.2)
Total Protein: 8.1 g/dL (ref 6.0–8.3)

## 2022-08-12 NOTE — Assessment & Plan Note (Signed)
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 Previously on chronic steroids but weaned off December 2021. She was noted to have mild progression of her ILD on her scan from July 2023. Decision was made to move forward with antifibrotic therapy. She was started on Ofev. Unfortunately, she was unable to tolerate this due to significant N/V so she stopped it. Discussed potential options today including alternative antifibrotic or watchful waiting. Shared decision to start on Esbriet given the progression of her disease. Message sent to pharmacy team and Dr. [name].  Patient Instructions  Start Esbriet - 1 pill Three times a day for one week, then 2 pills Three times a day for one week then 3 pills Three times a day, which will be your maintenance dose.  -Take it with food.  -Make sure you have 5-6 hours between doses.  -Wear sunscreen when outside  Continue CellCept 1.5 g Twice daily  Continue plaquenil 200 mg Twice daily   Follow up with rheumatology as scheduled  Labs today - CMET  Follow up in 6 weeks with Dr. [name] or Katie Ellyssa Zagal,NP. Keep follow up with Dr. [name] for January. If symptoms do not improve or worsen, please contact office for sooner follow up or seek emergency care.

## 2022-08-12 NOTE — Progress Notes (Signed)
_0  ID: Chelsea Jennings, female    DOB: 1971-04-04, 51 y.o.   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  She is a patient Chelsea Jennings last seen in office 05/17/2022.  Past medical history significant for hypertension, GERD.  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  She was put on prednisone 40 mg and then CellCept which was titrated up to 1 g daily.  She was able to be weaned off prednisone in December 2021 with improvement in symptoms of dyspnea.  She follows with Chelsea Jennings, rheumatology at the [facility] and is on Plaquenil.  She completed pulmonary rehab in 2021.  Pets: She is to have cats in the past.  No pets currently Occupation: Works for [employer].  Previously in the [employer] Exposures: Exposed to chemicals at [employer] in [place] Smoking history: Never smoker Travel history: No significant recent travel Relevant family history:No significant family history of lung disease  TEST/EVENTS:  11/10/2021 PFT: FVC 81, FEV1 81, ratio 86, TLC 68, DLCO 48.  No significant BD 05/11/2022 HRCT chest: Mild progression of ILD, again unusual in appearance but given the spectrum of findings and the mild progression, highly suspicious for UIP.  05/17/2022: OV with Chelsea Jennings.  Recently finished prednisone taper and feeling well with no issues.  Here for review of CT scan which showed mild progression of ILD with suggestion of honeycombing.  Decided to move forward with antifibrotic therapy with Ofev.  May be a good transplant candidate as well.  08/12/2022: Today - follow up Patient presents today for follow up and to discuss Ofev side effects. She had started on Ofev in July; she started with one dose daily and had significant nausea with a few  episodes of vomiting. She tried taking it with different food options without any change. She stopped it after 5 days and felt better immediately. Today, she reports that her breathing has been stable. She can complete activities of daily living without difficulty. No increased cough or chest congestion. She's taking CellCept 1.5 g Twice daily and still on plaquenil. She's feeling relatively well overall.   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     benzonatate (TESSALON) 200 MG capsule Take 1 capsule (200 mg total) by mouth 3 (three) times daily as needed for cough. 45 capsule 2   clobetasol ointment (TEMOVATE) 8.65 % Apply 1 application topically 2 (two) times daily.     dorzolamide-timolol (COSOPT) 22.3-6.8 MG/ML ophthalmic solution 1 drop 2 (two) times daily.     esomeprazole (NEXIUM) 40  MG capsule Take 1 capsule (40 mg total) by mouth daily at 12 noon. 30 capsule 5   hydroxychloroquine (PLAQUENIL) 200 MG tablet Take 200 mg by mouth 2 (two) times daily.     latanoprost (XALATAN) 0.005 % ophthalmic solution Place 1 drop into both eyes at bedtime.     levocetirizine (XYZAL) 5 MG tablet TAKE 1 TABLET BY MOUTH EVERY DAY IN THE  EVENING 90 tablet 1   mycophenolate (CELLCEPT) 500 MG tablet Take 3 tablets (1,500 mg total) by mouth 2 (two) times daily. 180 tablet 5   Nintedanib (OFEV) 150 MG CAPS Take 1 capsule (150 mg total) by mouth 2 (two) times daily. 180 capsule 1   sulfamethoxazole-trimethoprim (BACTRIM DS) 800-160 MG tablet Take 1 tablet by mouth 3 (three) times a week. 39 tablet 0   HYDROcodone bit-homatropine (HYDROMET) 5-1.5 MG/5ML syrup Take 5 mLs by mouth at bedtime as needed for cough. (Patient not taking: Reported on 08/12/2022) 120 mL 0   chlorpheniramine (CHLOR-TRIMETON) 4 MG tablet Take 4 mg by mouth 2 (two) times daily as needed for allergies.     mycophenolate (CELLCEPT) 500 MG tablet Take 3 tablets (1,500 mg total) by mouth 2 (two) times daily. 42 tablet 0   No facility-administered medications prior to visit.     Review of Systems:   Constitutional: No weight loss or gain, night sweats, fevers, chills, or lassitude. +fatigue (baseline) HEENT: No headaches, difficulty swallowing, tooth/dental problems, or sore throat. No sneezing, itching, ear ache, nasal congestion, or post nasal drip CV:  No chest pain, orthopnea, PND, swelling in lower extremities, anasarca, dizziness, palpitations, syncope Resp: +shortness of breath with exertion (baseline). No excess mucus or change in color of mucus. No productive or non-productive. No hemoptysis. No wheezing.  No chest wall deformity GI:  No heartburn, indigestion, abdominal pain, nausea, vomiting, diarrhea, change in bowel habits, loss of appetite, bloody stools.  MSK:  No joint pain or swelling.  No decreased range of motion.  No back pain. Neuro: No dizziness or lightheadedness.  Psych: No depression or anxiety. Mood stable.     Physical Exam:  BP 124/80 (BP Location: Right Arm, Cuff Size: Normal)   Pulse 74   Temp 98 F (36.7 C)   Ht _0  (1.702 m)   Wt 208 lb (94.3 kg)   SpO2 100%   BMI 32.58 kg/m   GEN: Pleasant, interactive, well-appearing;  obese; in no acute distress. HEENT:  Normocephalic and atraumatic. PERRLA. Sclera white. Nasal turbinates pink, moist and patent bilaterally. No rhinorrhea present. Oropharynx pink and moist, without exudate or edema. No lesions, ulcerations, or postnasal drip.  NECK:  Supple w/ fair ROM. No LAD.   CV: RRR, no m/r/g, no peripheral edema. Pulses intact, +2 bilaterally. No cyanosis, pallor or clubbing. PULMONARY:  Unlabored, regular breathing. Mild bibasilar crackles otherwise clear bilaterally A&P w/o wheezes/rales/rhonchi. No accessory muscle use.  GI: BS present and normoactive. Soft, non-tender to palpation. No organomegaly or masses detected.  MSK: No erythema, warmth or tenderness. Cap refil <2 sec all extrem. No deformities or joint swelling noted.  Neuro: A/Ox3. No focal deficits noted.   Skin: Warm, no lesions or rashe Psych: Normal affect and behavior. Judgement and thought content appropriate.     Lab Results:  CBC    Component Value Date/Time   WBC 4.1 05/17/2022 1650   RBC 4.31 05/17/2022 1650   HGB 12.6 05/17/2022 1650   HCT 39.0 05/17/2022 1650   PLT 294.0 05/17/2022 1650  MCV 90.4 05/17/2022 1650   MCHC 32.4 05/17/2022 1650   RDW 12.9 05/17/2022 1650   LYMPHSABS 1.1 05/17/2022 1650   MONOABS 0.4 05/17/2022 1650   EOSABS 0.0 05/17/2022 1650   BASOSABS 0.0 05/17/2022 1650    BMET    Component Value Date/Time   NA 138 05/17/2022 1650   K 4.0 05/17/2022 1650   CL 102 05/17/2022 1650   CO2 30 05/17/2022 1650   GLUCOSE 85 05/17/2022 1650   BUN 9 05/17/2022 1650   CREATININE 0.83 05/17/2022 1650   CALCIUM 10.2 05/17/2022 1650    BNP No results found for: "BNP"   Imaging:  No results found.       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 Previously on chronic steroids but weaned off December 2021. She was noted to have mild progression of her ILD on her scan from July 2023. Decision was made to move forward with antifibrotic therapy. She was started on Ofev. Unfortunately, she was unable to tolerate this due to significant N/V so she stopped it. Discussed potential options today including alternative antifibrotic or watchful waiting. Shared decision to start on Esbriet given the progression of her disease. Message sent to pharmacy team and Chelsea Jennings.  Patient Instructions  Start Esbriet - 1 pill Three times a day for one week, then 2 pills Three times a day for one week then 3 pills Three times a day, which will be your maintenance dose.  -Take it with food.  -Make sure you have 5-6 hours between doses.  -Wear sunscreen when outside  Continue CellCept 1.5 g Twice daily  Continue plaquenil 200 mg Twice daily   Follow up with rheumatology as scheduled  Labs today - CMET  Follow up in 6 weeks with Chelsea Jennings or Katie Melani Brisbane,NP. Keep follow up with Chelsea Jennings for January. If symptoms do not improve or worsen, please contact office for sooner follow up or seek emergency care.     I spent 32 minutes of dedicated to the care of this patient on the date of this encounter to include pre-visit review of records, face-to-face time with the patient discussing conditions above, post visit ordering of testing, clinical documentation with the electronic health record, making appropriate referrals as documented, and communicating necessary findings to members of the  patients care team.  Clayton Bibles, NP 08/12/2022  Pt aware and understands NP's role.

## 2022-08-12 NOTE — Patient Instructions (Signed)
Start Esbriet - 1 pill Three times a day for one week, then 2 pills Three times a day for one week then 3 pills Three times a day, which will be your maintenance dose.  -Take it with food.  -Make sure you have 5-6 hours between doses.  -Wear sunscreen when outside  Continue CellCept 1.5 g Twice daily  Continue plaquenil 200 mg Twice daily   Follow up with rheumatology as scheduled  Labs today - CMET  Follow up in 6 weeks with Dr. [name] or Katie Twylia Oka,NP. Keep follow up with Dr. [name] for January. If symptoms do not improve or worsen, please contact office for sooner follow up or seek emergency care.

## 2022-08-15 ENCOUNTER — Encounter: Payer: Self-pay | Admitting: Nurse Practitioner

## 2022-08-19 ENCOUNTER — Telehealth: Payer: Self-pay

## 2022-08-19 DIAGNOSIS — J849 Interstitial pulmonary disease, unspecified: Secondary | ICD-10-CM

## 2022-08-19 NOTE — Telephone Encounter (Signed)
Received new start request to switch pt from Ofev to [place], however pt currently receives her Ofev through the [place].  Rx for Esbriet titration dose (all the way up to 3 tabs 3 times daily per Katie Cobb's OV note) will need to be sent to [place] along with clinical documentation.  Phone: [phone] ext. [phone] Fax: [phone]

## 2022-08-25 MED ORDER — PIRFENIDONE 267 MG PO TABS: ORAL_TABLET | ORAL | 4 refills | Status: DC

## 2022-08-25 MED ORDER — PIRFENIDONE 267 MG PO TABS: ORAL_TABLET | ORAL | 0 refills | Status: DC

## 2022-08-25 NOTE — Telephone Encounter (Signed)
Patient is new start to pirfenidone.  Rx for pirfenidone (267mg  tablets) escribed to [facility]  Month 1: Take 1 tab three times daily for 7 days, then 2 tabs three times daily for 7 days, then 3 tabs three times daily thereafter.  Month 2 and onwards: 3 tabs (801mg ) three times daily  Clinicals faxed to [facility] for review.   Phone: [phone], ext [place] Fax: [phone]  Called patient to advise. She verbalized understanding. She will call once medication is received  Knox Saliva, PharmD, MPH, BCPS, CPP Clinical Pharmacist (Rheumatology and Pulmonology)

## 2022-08-29 ENCOUNTER — Ambulatory Visit: Payer: No Typology Code available for payment source | Admitting: Pulmonary Disease

## 2022-09-06 NOTE — Telephone Encounter (Signed)
Patient called on 09/05/22 and advised that she received pirfenidone shipment from [facility]. Will f/u to provide counseling  Knox Saliva, PharmD, MPH, BCPS, CPP Clinical Pharmacist (Rheumatology and Pulmonology)

## 2022-09-07 ENCOUNTER — Telehealth: Payer: Self-pay

## 2022-09-07 DIAGNOSIS — J849 Interstitial pulmonary disease, unspecified: Secondary | ICD-10-CM

## 2022-09-07 DIAGNOSIS — Z5181 Encounter for therapeutic drug level monitoring: Secondary | ICD-10-CM

## 2022-09-07 NOTE — Telephone Encounter (Signed)
Subjective:  Patient called today by [facility] pharmacy team for Esbriet new start.   Patient was last seen by Roxan Diesel, NP on 08/12/22.  Pertinent past medical history includes CTD-ILD (unspecified CTD). Her rheumatologist is with the [facility]. She takes hydroxychloroquine 280m twice daily. She also takes mycophenolate 1500 mg twice daily with Bactrim DS three times weekly for PJP prophylaxis.  Started Ofev in lateAugust but had persistent and intolerable nausea and vomiting.  History of elevated LFTs: No History of diarrhea, nausea, vomiting: Yes -* while on Ofev  Objective: Allergies  Allergen Reactions   Famotidine Anxiety and Palpitations    Outpatient Encounter Medications as of 09/07/2022  Medication Sig   amLODipine (NORVASC) 5 MG tablet Take 5 mg by mouth daily.   benzonatate (TESSALON) 200 MG capsule Take 1 capsule (200 mg total) by mouth 3 (three) times daily as needed for cough.   clobetasol ointment (TEMOVATE) 03.97% Apply 1 application topically 2 (two) times daily.   dorzolamide-timolol (COSOPT) 22.3-6.8 MG/ML ophthalmic solution 1 drop 2 (two) times daily.   esomeprazole (NEXIUM) 40 MG capsule Take 1 capsule (40 mg total) by mouth daily at 12 noon.   HYDROcodone bit-homatropine (HYDROMET) 5-1.5 MG/5ML syrup Take 5 mLs by mouth at bedtime as needed for cough. (Patient not taking: Reported on 08/12/2022)   hydroxychloroquine (PLAQUENIL) 200 MG tablet Take 200 mg by mouth 2 (two) times daily.   latanoprost (XALATAN) 0.005 % ophthalmic solution Place 1 drop into both eyes at bedtime.   levocetirizine (XYZAL) 5 MG tablet TAKE 1 TABLET BY MOUTH EVERY DAY IN THE EVENING   mycophenolate (CELLCEPT) 500 MG tablet Take 3 tablets (1,500 mg total) by mouth 2 (two) times daily.   Pirfenidone (ESBRIET) 267 MG TABS Month 2 and onwards: take 3 tablets by mouth three times daily with meals   Pirfenidone 267 MG TABS Month 1: Take 1 tab three times daily for 7 days, then 2 tabs three  times daily for 7 days, then 3 tabs three times daily thereafter.   No facility-administered encounter medications on file as of 09/07/2022.     Immunization History  Administered Date(s) Administered   Influenza,inj,Quad PF,6+ Mos 09/02/2021   Moderna Sars-Covid-2 Vaccination 12/12/2019, 01/09/2020, 08/07/2020, 04/13/2021   Pneumococcal Conjugate-13 01/13/2015, 04/23/2020   Pneumococcal Polysaccharide-23 05/07/2013, 08/29/2013      PFT's TLC  Date Value Ref Range Status  11/10/2021 3.75 L Final      CMP     Component Value Date/Time   NA 141 08/12/2022 1128   K 4.3 08/12/2022 1128   CL 104 08/12/2022 1128   CO2 28 08/12/2022 1128   GLUCOSE 95 08/12/2022 1128   BUN 11 08/12/2022 1128   CREATININE 0.84 08/12/2022 1128   CALCIUM 10.1 08/12/2022 1128   PROT 8.1 08/12/2022 1128   ALBUMIN 4.4 08/12/2022 1128   AST 12 08/12/2022 1128   ALT 11 08/12/2022 1128   ALKPHOS 56 08/12/2022 1128   BILITOT 0.5 08/12/2022 1128      CBC    Component Value Date/Time   WBC 4.1 05/17/2022 1650   RBC 4.31 05/17/2022 1650   HGB 12.6 05/17/2022 1650   HCT 39.0 05/17/2022 1650   PLT 294.0 05/17/2022 1650   MCV 90.4 05/17/2022 1650   MCHC 32.4 05/17/2022 1650   RDW 12.9 05/17/2022 1650   LYMPHSABS 1.1 05/17/2022 1650   MONOABS 0.4 05/17/2022 1650   EOSABS 0.0 05/17/2022 1650   BASOSABS 0.0 05/17/2022 1650  LFT's    Latest Ref Rng & Units 08/12/2022   11:28 AM 05/17/2022    4:50 PM 08/24/2021    9:47 AM  Hepatic Function  Total Protein 6.0 - 8.3 g/dL 8.1  8.2  7.7   Albumin 3.5 - 5.2 g/dL 4.4  4.5  4.2   AST 0 - 37 U/L _0 ALT 0 - 35 U/L _1 Alk Phosphatase 39 - 117 U/L 56  57  59   Total Bilirubin 0.2 - 1.2 mg/dL 0.5  0.4  0.5     HRCT (05/12/2022) Mild progression of interstitial lung disease which is again unusual in appearance, but given the spectrum of findings and the apparent mild progression compared to the prior examination is 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 Given the persistent subpleural sparing, the possibility of an alternative diagnosis such as fibrotic phase nonspecific interstitial pneumonia (NSIP) remains a consideration. Correlation with lung biopsy could be considered if clinically appropriate.  Assessment and Plan  Esbriet Medication Management Thoroughly counseled patient on the efficacy, mechanism of action, dosing, administration, adverse effects, and monitoring parameters of Esbriet.  Patient verbalized understanding.   Goals of Therapy: Will not stop or reverse the progression of ILD. It will slow the progression of ILD.  Patient has CTD-related ILD and we discussed in detail that pirfenidone is only FDA-approved for IPF. Reviewed theoretical benefit for her type of ILD though no FDA approval. Also reviewed that if she fails pirfenidone, then Dr. [name] may discuss clinical trials as next option but would require office visit and detailed discussion  Dosing: Starting dose will be Esbriet 267 mg 1 tablet three times daily for 7 days, then 2 tablets three times daily for 7 days, then 3 tablets three times daily.  Maintenance dose will be 801 mg 1 tablet three times daily if tolerated.  Stressed the importance of taking with meals and space at least 5-6 hours apart to minimize stomach upset.   Adverse Effects: Nausea, vomiting, diarrhea, weight loss Abdominal pain GERD Sun sensitivity/rash - patient advised to wear sunscreen when exposed to sunlight Dizziness Fatigue  Monitoring: Monitor for diarrhea, nausea and vomiting, GI perforation, hepatotoxicity  Monitor LFTs - baseline, monthly for first 6 months, then every 3 months routinely CBC w differential at baseline and every 3 months routinely Future order for CMET placed today  Access: Approval of Esbriet through: insurance Rx sent to:  [place] Patient received medication on 09/05/22 and will plan  to start tomorrow, 09/08/22  Medication Reconciliation A drug regimen assessment was performed, including review of allergies, interactions, disease-state management, dosing and immunization history. Medications were reviewed with the patient, including name, instructions, indication, goals of therapy, potential side effects, importance of adherence, and safe use.  This appointment required 30 minutes of patient care (this includes precharting, chart review, review of results, face-to-face care, etc.).  Thank you for involving pharmacy to assist in providing this patient's care.  Knox Saliva, PharmD, MPH, BCPS, CPP Clinical Pharmacist (Rheumatology and Pulmonology)

## 2022-09-23 ENCOUNTER — Ambulatory Visit: Payer: No Typology Code available for payment source | Admitting: Pulmonary Disease

## 2022-09-24 ENCOUNTER — Other Ambulatory Visit: Payer: Self-pay | Admitting: Pulmonary Disease

## 2022-10-10 ENCOUNTER — Other Ambulatory Visit: Payer: Self-pay | Admitting: Pulmonary Disease

## 2022-10-17 ENCOUNTER — Telehealth: Payer: Self-pay | Admitting: Pulmonary Disease

## 2022-10-17 NOTE — Telephone Encounter (Signed)
Called and spoke with patient. Patient stated she was able to receive her Bactrim with GoodRx card.  Nothing further at this time.

## 2022-10-26 ENCOUNTER — Encounter: Payer: Self-pay | Admitting: Nurse Practitioner

## 2022-10-26 ENCOUNTER — Ambulatory Visit (INDEPENDENT_AMBULATORY_CARE_PROVIDER_SITE_OTHER): Payer: No Typology Code available for payment source | Admitting: Nurse Practitioner

## 2022-10-26 VITALS — BP 128/78 | HR 79 | Ht 67.0 in | Wt 208.6 lb

## 2022-10-26 DIAGNOSIS — J849 Interstitial pulmonary disease, unspecified: Secondary | ICD-10-CM

## 2022-10-26 DIAGNOSIS — K219 Gastro-esophageal reflux disease without esophagitis: Secondary | ICD-10-CM

## 2022-10-26 LAB — COMPREHENSIVE METABOLIC PANEL
ALT: 10 U/L (ref 0–35)
AST: 11 U/L (ref 0–37)
Albumin: 4.3 g/dL (ref 3.5–5.2)
Alkaline Phosphatase: 64 U/L (ref 39–117)
BUN: 9 mg/dL (ref 6–23)
CO2: 29 mEq/L (ref 19–32)
Calcium: 9.8 mg/dL (ref 8.4–10.5)
Chloride: 104 mEq/L (ref 96–112)
Creatinine, Ser: 0.76 mg/dL (ref 0.40–1.20)
GFR: 90.76 mL/min (ref >60.00)
Glucose, Bld: 98 mg/dL (ref 70–99)
Potassium: 4.1 mEq/L (ref 3.5–5.1)
Sodium: 141 mEq/L (ref 135–145)
Total Bilirubin: 0.4 mg/dL (ref 0.2–1.2)
Total Protein: 7.8 g/dL (ref 6.0–8.3)

## 2022-10-26 MED ORDER — PANTOPRAZOLE SODIUM 40 MG PO TBEC: 40.0000 mg | DELAYED_RELEASE_TABLET | Freq: Two times a day (BID) | ORAL | 5 refills | Status: DC

## 2022-10-26 NOTE — Patient Instructions (Addendum)
Continue Esbriet 3 pills Three times a day, which will be your maintenance dose.  -Take it with food.  -Make sure you have 5-6 hours between doses.  -Wear sunscreen when outside  Continue CellCept 1.5 g Twice daily  Continue plaquenil 200 mg Twice daily   Stop nexium. Start pantoprazole (protonix) 1 tab Twice daily - 30 min before breakfast and At bedtime    Follow up with rheumatology as scheduled   Labs today - CMET   Follow up in 6 weeks with Dr. Isaiah Serge or Katie Clarisa Danser,NP. Marland Kitchen If symptoms do not improve or worsen, please contact office for sooner follow up or seek emergency care.

## 2022-10-26 NOTE — Progress Notes (Addendum)
@Patient  ID: [name], female    DOB: 28-Oct-1971, 52 y.o.   MRN: [id number]  Chief Complaint  Patient presents with   Follow-up    Pt f/u she feels like the medication is working great, she has had an increase in her reflux.     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  She is a patient Dr. Shirlee More last seen in office 08/12/2022 by Allison Quarry NP.  Past medical history significant for hypertension, GERD.  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  She was put on prednisone 40 mg and then CellCept which was titrated up to 1 g daily.  She was able to be weaned off prednisone in December 2021 with improvement in symptoms of dyspnea.  She follows with Dr. Gevena Mart, rheumatology at the [facility] and is on Plaquenil.  She completed pulmonary rehab in 2021.  Pets: She is to have cats in the past.  No pets currently Occupation: Works for [employer].  Previously in the [employer] Exposures: Exposed to chemicals at [employer] camp in [place] Smoking history: Never smoker Travel history: No significant recent travel Relevant family history:No significant family history of lung disease  TEST/EVENTS:  11/10/2021 PFT: FVC 81, FEV1 81, ratio 86, TLC 68, DLCO 48.  No significant BD 05/11/2022 HRCT chest: Mild progression of ILD, again unusual in appearance but given the spectrum of findings and the mild progression, highly suspicious for UIP.  05/17/2022: OV with Dr. Isaiah Serge.  Recently finished prednisone taper and feeling well with no issues.  Here for review of CT scan which showed mild progression of ILD with suggestion of honeycombing.  Decided to move forward with antifibrotic therapy with Ofev.  May be a good transplant candidate as well.  08/12/2022: Ov with Makinsley Schiavi NP for follow up and to discuss Ofev side effects. She had started on  Ofev in July; she started with one dose daily and had significant nausea with a few episodes of vomiting. She tried taking it with different food options without any change. She stopped it after 5 days and felt better immediately. Today, she reports that her breathing has been stable. She can complete activities of daily living without difficulty. No increased cough or chest congestion. She's taking CellCept 1.5 g Twice daily and still on plaquenil. She's feeling relatively well overall. Shared decision to stop Ofev and start on Esbriet. Labs stable.  10/26/2022: Today - follow up Patient presents today for follow up. She started on Esbriet the first week of November 2023. She has been tolerating this very well. Her only major side effect has been increased reflux. She switch from omeprazole to nexium, which initially helped but now she feels like her indigestion is back. She denies any changes in appetite, nausea, vomiting, difficulties swallowing, hoarse voice. Her breathing is stable. No worsening cough or chest congestion.   Allergies  Allergen Reactions   Famotidine Anxiety and Palpitations    Immunization History  Administered Date(s) Administered   Influenza,inj,Quad PF,6+ Mos 09/02/2021   Moderna Sars-Covid-2 Vaccination 12/12/2019, 01/09/2020, 08/07/2020, 04/13/2021   Pneumococcal Conjugate-13 01/13/2015, 04/23/2020   Pneumococcal Polysaccharide-23 05/07/2013, 08/29/2013    Past Medical History:  Diagnosis Date   BARD1 gene mutation positive 12/29/2021   Family history of breast cancer 12/06/2021   Hypertension    Interstitial lung disease (HCC)     Tobacco History:  Social History   Tobacco Use  Smoking Status Never  Smokeless Tobacco Never   Counseling given: Not Answered   Outpatient Medications Prior to Visit  Medication Sig Dispense Refill   amLODipine (NORVASC) 5 MG tablet Take 5 mg by mouth daily.     benzonatate (TESSALON) 200 MG capsule Take 1 capsule (200 mg  total) by mouth 3 (three) times daily as needed for cough. 45 capsule 2   clobetasol ointment (TEMOVATE) 0.05 % Apply 1 application topically 2 (two) times daily.     dorzolamide-timolol (COSOPT) 22.3-6.8 MG/ML ophthalmic solution 1 drop 2 (two) times daily.     HYDROcodone bit-homatropine (HYDROMET) 5-1.5 MG/5ML syrup Take 5 mLs by mouth at bedtime as needed for cough. 120 mL 0   hydroxychloroquine (PLAQUENIL) 200 MG tablet Take 200 mg by mouth 2 (two) times daily.     latanoprost (XALATAN) 0.005 % ophthalmic solution Place 1 drop into both eyes at bedtime.     levocetirizine (XYZAL) 5 MG tablet TAKE 1 TABLET BY MOUTH EVERY DAY IN THE EVENING 90 tablet 1   mycophenolate (CELLCEPT) 500 MG tablet Take 3 tablets (1,500 mg total) by mouth 2 (two) times daily. 180 tablet 5   Pirfenidone (ESBRIET) 267 MG TABS Month 2 and onwards: take 3 tablets by mouth three times daily with meals 270 tablet 4   Pirfenidone 267 MG TABS Month 1: Take 1 tab three times daily for 7 days, then 2 tabs three times daily for 7 days, then 3 tabs three times daily thereafter. 207 tablet 0   sulfamethoxazole-trimethoprim (BACTRIM DS) 800-160 MG tablet TAKE 1 TABLET BY MOUTH THREE TIMES A WEEK 39 tablet 1   esomeprazole (NEXIUM) 40 MG capsule TAKE 1 CAPSULE (40 MG TOTAL) BY MOUTH DAILY AT 12 NOON. 90 capsule 1   No facility-administered medications prior to visit.     Review of Systems:   Constitutional: No weight loss or gain, night sweats, fevers, chills, or lassitude. +fatigue (baseline) HEENT: No headaches, difficulty swallowing, tooth/dental problems, or sore throat. No sneezing, itching, ear ache, nasal congestion, or post nasal drip CV:  No chest pain, orthopnea, PND, swelling in lower extremities, anasarca, dizziness, palpitations, syncope Resp: +shortness of breath with exertion (baseline). No excess mucus or change in color of mucus. No productive or non-productive. No hemoptysis. No wheezing.  No chest wall  deformity GI:  +heartburn/indigestion. No abdominal pain, nausea, vomiting, diarrhea, change in bowel habits, loss of appetite, bloody stools.  MSK:  No joint pain or swelling.  No decreased range of motion.  No back pain. Neuro: No dizziness or lightheadedness.  Psych: No depression or anxiety. Mood stable.     Physical Exam:  BP 128/78   Pulse 79   Ht 5\' 7"  (1.702 m)   Wt 208 lb 9.6 oz (94.6 kg)   SpO2 100%   BMI 32.67 kg/m   GEN: Pleasant, interactive, well-appearing; obese; in no acute distress. HEENT:  Normocephalic and atraumatic. PERRLA. Sclera white. Nasal turbinates pink, moist and patent bilaterally. No rhinorrhea present. Oropharynx pink and moist, without exudate or edema. No lesions, ulcerations, or postnasal drip.  NECK:  Supple w/ fair ROM. No LAD.   CV: RRR, no m/r/g, no peripheral edema. Pulses intact, +2 bilaterally. No cyanosis, pallor or clubbing. PULMONARY:  Unlabored, regular breathing. Mild bibasilar crackles otherwise clear bilaterally A&P w/o wheezes/rales/rhonchi. No accessory muscle use.  GI: BS present and normoactive. Soft, non-tender to palpation. No organomegaly or masses detected.  MSK: No erythema,  warmth or tenderness. Cap refil <2 sec all extrem. No deformities or joint swelling noted.  Neuro: A/Ox3. No focal deficits noted.   Skin: Warm, no lesions or rashe Psych: Normal affect and behavior. Judgement and thought content appropriate.     Lab Results:  CBC    Component Value Date/Time   WBC 4.1 05/17/2022 1650   RBC 4.31 05/17/2022 1650   HGB 12.6 05/17/2022 1650   HCT 39.0 05/17/2022 1650   PLT 294.0 05/17/2022 1650   MCV 90.4 05/17/2022 1650   MCHC 32.4 05/17/2022 1650   RDW 12.9 05/17/2022 1650   LYMPHSABS 1.1 05/17/2022 1650   MONOABS 0.4 05/17/2022 1650   EOSABS 0.0 05/17/2022 1650   BASOSABS 0.0 05/17/2022 1650    BMET    Component Value Date/Time   NA 141 10/26/2022 1044   K 4.1 10/26/2022 1044   CL 104 10/26/2022 1044    CO2 29 10/26/2022 1044   GLUCOSE 98 10/26/2022 1044   BUN 9 10/26/2022 1044   CREATININE 0.76 10/26/2022 1044   CALCIUM 9.8 10/26/2022 1044    BNP No results found for: "BNP"   Imaging:  No results found.       Latest Ref Rng & Units 11/10/2021   10:01 AM  PFT Results  FVC-Pre L 2.63   FVC-Predicted Pre % 81   FVC-Post L 2.55   FVC-Predicted Post % 78   Pre FEV1/FVC % % 81   Post FEV1/FCV % % 86   FEV1-Pre L 2.12   FEV1-Predicted Pre % 81   FEV1-Post L 2.20   DLCO uncorrected ml/min/mmHg 11.22   DLCO UNC% % 48   DLCO corrected ml/min/mmHg 11.22   DLCO COR %Predicted % 48   DLVA Predicted % 75   TLC L 3.75   TLC % Predicted % 68   RV % Predicted % 52     No results found for: "NITRICOXIDE"      Assessment & Plan:   ILD (interstitial lung disease) (HCC) NSIP ILD r/t unspecified connective tissue disease; diagnosed in 2021. Previously on chronic steroids but weaned off December 2021. She was noted to have mild progression of her ILD on her scan from July 2023. Decision was made to move forward with antifibrotic therapy. She was started on Ofev. Unfortunately, she was unable to tolerate this due to significant N/V so she stopped it. Shared decision to change her to Esbriet, which was started in November 2023. She has been tolerating this well aside from reflux. We will start her on twice daily PPI with protonix. If symptoms improve, we can decrease this to once daily. Reviewed risks/benefits. She will use OTC antiacid for breakthrough; unable to tolerate pepcid. Ensured she is taking Esbriet with food. Plan for repeat spirometry/DLCO upon return.   Patient Instructions  Continue Esbriet 3 pills Three times a day, which will be your maintenance dose.  -Take it with food.  -Make sure you have 5-6 hours between doses.  -Wear sunscreen when outside  Continue CellCept 1.5 g Twice daily  Continue plaquenil 200 mg Twice daily   Stop nexium. Start pantoprazole  (protonix) 1 tab Twice daily - 30 min before breakfast and At bedtime    Follow up with rheumatology as scheduled   Labs today - CMET   Follow up in 6 weeks with Dr. Isaiah Serge or Katie Tajah Schreiner,NP. Marland Kitchen If symptoms do not improve or worsen, please contact office for sooner follow up or seek emergency care.    GERD (  gastroesophageal reflux disease) See above.    I spent 32 minutes of dedicated to the care of this patient on the date of this encounter to include pre-visit review of records, face-to-face time with the patient discussing conditions above, post visit ordering of testing, clinical documentation with the electronic health record, making appropriate referrals as documented, and communicating necessary findings to members of the patients care team.  Noemi Chapel, NP 10/26/2022  Pt aware and understands NP's role.

## 2022-10-26 NOTE — Assessment & Plan Note (Signed)
See above

## 2022-10-26 NOTE — Progress Notes (Signed)
Labs nl

## 2022-10-26 NOTE — Assessment & Plan Note (Signed)
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 Previously on chronic steroids but weaned off December 2021. She was noted to have mild progression of her ILD on her scan from July 2023. Decision was made to move forward with antifibrotic therapy. She was started on Ofev. Unfortunately, she was unable to tolerate this due to significant N/V so she stopped it. Shared decision to change her to Esbriet, which was started in November 2023. She has been tolerating this well aside from reflux. We will start her on twice daily PPI with protonix. If symptoms improve, we can decrease this to once daily. Reviewed risks/benefits. She will use OTC antiacid for breakthrough; unable to tolerate pepcid. Ensured she is taking Esbriet with food. Plan for repeat spirometry/DLCO upon return.   Patient Instructions  Continue Esbriet 3 pills Three times a day, which will be your maintenance dose.  -Take it with food.  -Make sure you have 5-6 hours between doses.  -Wear sunscreen when outside  Continue CellCept 1.5 g Twice daily  Continue plaquenil 200 mg Twice daily   Stop nexium. Start pantoprazole (protonix) 1 tab Twice daily - 30 min before breakfast and At bedtime    Follow up with rheumatology as scheduled   Labs today - CMET   Follow up in 6 weeks with Dr. Isaiah Serge or Katie Aubry Tucholski,NP. Marland Kitchen If symptoms do not improve or worsen, please contact office for sooner follow up or seek emergency care.

## 2022-12-02 ENCOUNTER — Encounter: Payer: Self-pay | Admitting: Pulmonary Disease

## 2022-12-02 ENCOUNTER — Ambulatory Visit (INDEPENDENT_AMBULATORY_CARE_PROVIDER_SITE_OTHER): Payer: No Typology Code available for payment source | Admitting: Pulmonary Disease

## 2022-12-02 VITALS — BP 110/64 | HR 71 | Temp 98.5°F | Ht 67.0 in | Wt 210.0 lb

## 2022-12-02 DIAGNOSIS — J849 Interstitial pulmonary disease, unspecified: Secondary | ICD-10-CM | POA: Diagnosis not present

## 2022-12-02 DIAGNOSIS — Z5181 Encounter for therapeutic drug level monitoring: Secondary | ICD-10-CM

## 2022-12-02 DIAGNOSIS — R0602 Shortness of breath: Secondary | ICD-10-CM | POA: Diagnosis not present

## 2022-12-02 LAB — CBC WITH DIFFERENTIAL/PLATELET
Basophils Absolute: 0 10*3/uL (ref 0.0–0.1)
Basophils Relative: 1 % (ref 0.0–3.0)
Eosinophils Absolute: 0 10*3/uL (ref 0.0–0.7)
Eosinophils Relative: 0.5 % (ref 0.0–5.0)
HCT: 37.8 % (ref 36.0–46.0)
Hemoglobin: 12.2 g/dL (ref 12.0–15.0)
Lymphocytes Relative: 24.5 % (ref 12.0–46.0)
Lymphs Abs: 0.9 10*3/uL (ref 0.7–4.0)
MCHC: 32.3 g/dL (ref 30.0–36.0)
MCV: 90.9 fl (ref 78.0–100.0)
Monocytes Absolute: 0.3 10*3/uL (ref 0.1–1.0)
Monocytes Relative: 8.7 % (ref 3.0–12.0)
Neutro Abs: 2.4 10*3/uL (ref 1.4–7.7)
Neutrophils Relative %: 65.3 % (ref 43.0–77.0)
Platelets: 330 10*3/uL (ref 150.0–400.0)
RBC: 4.16 Mil/uL (ref 3.87–5.11)
RDW: 13.2 % (ref 11.5–15.5)
WBC: 3.7 10*3/uL — ABNORMAL LOW (ref 4.0–10.5)

## 2022-12-02 LAB — COMPREHENSIVE METABOLIC PANEL
ALT: 12 U/L (ref 0–35)
AST: 14 U/L (ref 0–37)
Albumin: 4.3 g/dL (ref 3.5–5.2)
Alkaline Phosphatase: 59 U/L (ref 39–117)
BUN: 9 mg/dL (ref 6–23)
CO2: 29 mEq/L (ref 19–32)
Calcium: 9.4 mg/dL (ref 8.4–10.5)
Chloride: 104 mEq/L (ref 96–112)
Creatinine, Ser: 0.75 mg/dL (ref 0.40–1.20)
GFR: 92.15 mL/min (ref >60.00)
Glucose, Bld: 90 mg/dL (ref 70–99)
Potassium: 3.7 mEq/L (ref 3.5–5.1)
Sodium: 139 mEq/L (ref 135–145)
Total Bilirubin: 0.4 mg/dL (ref 0.2–1.2)
Total Protein: 7.6 g/dL (ref 6.0–8.3)

## 2022-12-02 MED ORDER — BENZONATATE 200 MG PO CAPS: 200.0000 mg | ORAL_CAPSULE | Freq: Three times a day (TID) | ORAL | 2 refills | Status: DC | PRN

## 2022-12-02 NOTE — Patient Instructions (Signed)
I am glad you are tolerating the Esbriet better Continue the CellCept Will check some labs today for monitoring and check comprehensive hepatic panel monthly Continue medications twice daily for acid reflux Follow-up in 3 months.

## 2022-12-02 NOTE — Addendum Note (Signed)
Addended by: Elton Sin on: 12/02/2022 04:45 PM   Modules accepted: Orders

## 2022-12-02 NOTE — Progress Notes (Signed)
Chelsea Jennings    [id number]    [birth year]  Primary Care Physician:Hicks, Phylliss Bob, MD  Referring Physician: [name], MD [place],  [place] [address]  Chief complaint: Follow-up for CTD related interstitial lung disease, NSIP  HPI: 52 y.o.  with history of hypertension, unspecified connective tissue disease, NSIP interstitial lung disease here to establish care She was previously followed by Dr.  Gwenette Greet at the [facility] who has recently retired  Diagnosed with NSIP ILD related to unspecified connective tissue disease in May 2021.  She was put on prednisone 40 mg and then CellCept which is titrated up to 1 g twice daily.  She was able to be weaned off prednisone in December 2021 with improvement in symptoms of dyspnea. She also follows with Dr. Lavonna Monarch, rheumatology at the [facility] and is on Plaquenil. She completed pulmonary rehab in 2021  States that her dyspnea on exertion has been stable on CellCept.  She is also on Bactrim for prophylaxis. Had echocardiogram in 2021 at the [place] with no evidence of pulmonary hypertension  She also has history of GERD and was on omeprazole but developed side effects of rash.  She is currently on pantoprazole 20 mg a day which appears to control her symptoms Postnasal drip was treated with over-the-counter antihistamines and nasal spray.  She was on chlorpheniramine which at least helped with her anxiety.  She developed COVID-19 in January 23 and treated as an outpatient with Paxlovid.  Subsequently she had treatment with Z-Pak and amoxicillin for persistent symptoms.    Pets: She is to have cats in the past.  No pets currently Occupation: Works for [employer].  Previously in the [employer] Exposures: Exposed to chemicals at [employer] in [place] Smoking history: Never smoker Travel history: No significant recent travel Relevant family history:No significant family history of lung disease  Interim History: Started Ofev in July 2023.   This was poorly tolerated due to diarrhea and fatigue Started Esbriet in November 2023 which is better tolerated She is on PPI twice daily for cough   Outpatient Encounter Medications as of 12/02/2022  Medication Sig   amLODipine (NORVASC) 5 MG tablet Take 5 mg by mouth daily.   clobetasol ointment (TEMOVATE) 9.83 % Apply 1 application topically 2 (two) times daily.   dorzolamide-timolol (COSOPT) 22.3-6.8 MG/ML ophthalmic solution 1 drop 2 (two) times daily.   hydroxychloroquine (PLAQUENIL) 200 MG tablet Take 200 mg by mouth 2 (two) times daily.   latanoprost (XALATAN) 0.005 % ophthalmic solution Place 1 drop into both eyes at bedtime.   levocetirizine (XYZAL) 5 MG tablet TAKE 1 TABLET BY MOUTH EVERY DAY IN THE EVENING   mycophenolate (CELLCEPT) 500 MG tablet Take 3 tablets (1,500 mg total) by mouth 2 (two) times daily.   pantoprazole (PROTONIX) 40 MG tablet Take 1 tablet (40 mg total) by mouth 2 (two) times daily.   Pirfenidone (ESBRIET) 267 MG TABS Month 2 and onwards: take 3 tablets by mouth three times daily with meals   sulfamethoxazole-trimethoprim (BACTRIM DS) 800-160 MG tablet TAKE 1 TABLET BY MOUTH THREE TIMES A WEEK   Pirfenidone 267 MG TABS Month 1: Take 1 tab three times daily for 7 days, then 2 tabs three times daily for 7 days, then 3 tabs three times daily thereafter. (Patient not taking: Reported on 12/02/2022)   [DISCONTINUED] HYDROcodone bit-homatropine (HYDROMET) 5-1.5 MG/5ML syrup Take 5 mLs by mouth at bedtime as needed for cough.  No facility-administered encounter medications on file as of 12/02/2022.    Physical Exam: Blood pressure 110/64, pulse 71, temperature 98.5 F (36.9 C), temperature source Oral, height 5\' 7"  (1.702 m), weight 210 lb (95.3 kg), SpO2 100 %. Gen:      No acute distress HEENT:  EOMI, sclera anicteric Neck:     No masses; no thyromegaly Lungs:   Bibasal crackles CV:         Regular rate and rhythm; no murmurs Abd:      + bowel sounds; soft,  non-tender; no palpable masses, no distension Ext:    No edema; adequate peripheral perfusion Skin:      Warm and dry; no rash Neuro: alert and oriented x 3 Psych: normal mood and affect   Data Reviewed: Reviewed data from the [place] PFTs 2015-FVC 3.05 [79%], TLC 4.39 [80%], DLCO 65% 02/2020-FVC 2.47 [72%], TLC 3.39 [60%], DLCO 42%  CT chest 03/2020-baseline ILD with subpleural sparing, groundglass opacities with traction bronchiectasis.  Cysts versus honeycombing at the base.  Worsening compared to 2019  Echocardiogram May 2021-no pulmonary hypertension, positive for diastolic dysfunction  Imaging: High-res CT 09/14/2021-probable UIP pattern interstitial pneumonia, soft tissue mass in the anterior mediastinum, low-attenuation lesion in the liver.  MRI abdomen 10/13/2021-benign hepatic hemangiomas in the liver  CT chest 05/11/2022-Mild progression of interstitial lung disease with suggestion of honeycombing. I have reviewed the images personally.  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  She is off prednisone since 2021.  We will just place back on it due to post-COVID inflammation.  She has tapered off prednisone in June 2023.    Currently on CellCept at 1.5 g, Bactrim and Plaquenil Ofev is poorly tolerated and has started Esbriet in November 2023 Continue close monitoring for progression.  She may be a good transplant candidate as well. Check labs for monitoring today and monthly  Post COVID-19 Suspect COVID may have set off progression of her ILD.  She is off prednisone.  On antifibrotics as above  Chronic cough Felt to be multifactorial from interstitial lung  disease, GERD, postnasal drip Continue PPI twice daily, over-the-counter antihistamine  Liver lesions Noted on high-res CT Follow-up MRI shows benign hemangiomas  Plan/Recommendations: Continue CellCept, Bactrim, Plaquenil Continue Esbriet Check CBC, metabolic panel and proBNP  Marshell Garfinkel MD [facility] 12/02/2022, 1:18 PM  CC: [name], MD

## 2022-12-05 LAB — PRO B NATRIURETIC PEPTIDE: NT-Pro BNP: <36 pg/mL (ref 0–249)

## 2022-12-27 ENCOUNTER — Other Ambulatory Visit: Payer: Self-pay | Admitting: Pulmonary Disease

## 2023-01-02 ENCOUNTER — Other Ambulatory Visit (INDEPENDENT_AMBULATORY_CARE_PROVIDER_SITE_OTHER): Payer: No Typology Code available for payment source

## 2023-01-02 DIAGNOSIS — Z5181 Encounter for therapeutic drug level monitoring: Secondary | ICD-10-CM

## 2023-01-03 LAB — COMPREHENSIVE METABOLIC PANEL
ALT: 13 U/L (ref 0–35)
AST: 12 U/L (ref 0–37)
Albumin: 4 g/dL (ref 3.5–5.2)
Alkaline Phosphatase: 61 U/L (ref 39–117)
BUN: 10 mg/dL (ref 6–23)
CO2: 27 mEq/L (ref 19–32)
Calcium: 9.6 mg/dL (ref 8.4–10.5)
Chloride: 106 mEq/L (ref 96–112)
Creatinine, Ser: 0.86 mg/dL (ref 0.40–1.20)
GFR: 78.15 mL/min (ref >60.00)
Glucose, Bld: 88 mg/dL (ref 70–99)
Potassium: 3.9 mEq/L (ref 3.5–5.1)
Sodium: 140 mEq/L (ref 135–145)
Total Bilirubin: 0.3 mg/dL (ref 0.2–1.2)
Total Protein: 7.5 g/dL (ref 6.0–8.3)

## 2023-01-10 ENCOUNTER — Other Ambulatory Visit: Payer: Self-pay | Admitting: Pulmonary Disease

## 2023-01-13 ENCOUNTER — Other Ambulatory Visit: Payer: Self-pay | Admitting: Pulmonary Disease

## 2023-02-15 ENCOUNTER — Other Ambulatory Visit (INDEPENDENT_AMBULATORY_CARE_PROVIDER_SITE_OTHER): Payer: No Typology Code available for payment source

## 2023-02-15 DIAGNOSIS — Z5181 Encounter for therapeutic drug level monitoring: Secondary | ICD-10-CM | POA: Diagnosis not present

## 2023-02-16 ENCOUNTER — Ambulatory Visit: Payer: No Typology Code available for payment source | Admitting: Pulmonary Disease

## 2023-02-16 LAB — COMPREHENSIVE METABOLIC PANEL
ALT: 13 U/L (ref 0–35)
AST: 14 U/L (ref 0–37)
Albumin: 4.3 g/dL (ref 3.5–5.2)
Alkaline Phosphatase: 64 U/L (ref 39–117)
BUN: 10 mg/dL (ref 6–23)
CO2: 27 mEq/L (ref 19–32)
Calcium: 9.8 mg/dL (ref 8.4–10.5)
Chloride: 103 mEq/L (ref 96–112)
Creatinine, Ser: 0.94 mg/dL (ref 0.40–1.20)
GFR: 70.18 mL/min (ref >60.00)
Glucose, Bld: 74 mg/dL (ref 70–99)
Potassium: 3.6 mEq/L (ref 3.5–5.1)
Sodium: 138 mEq/L (ref 135–145)
Total Bilirubin: 0.3 mg/dL (ref 0.2–1.2)
Total Protein: 7.8 g/dL (ref 6.0–8.3)

## 2023-02-17 ENCOUNTER — Encounter: Payer: Self-pay | Admitting: Pulmonary Disease

## 2023-02-17 ENCOUNTER — Ambulatory Visit (INDEPENDENT_AMBULATORY_CARE_PROVIDER_SITE_OTHER): Payer: No Typology Code available for payment source | Admitting: Pulmonary Disease

## 2023-02-17 VITALS — BP 118/72 | HR 80 | Ht 67.0 in | Wt 214.2 lb

## 2023-02-17 DIAGNOSIS — Z5181 Encounter for therapeutic drug level monitoring: Secondary | ICD-10-CM | POA: Diagnosis not present

## 2023-02-17 DIAGNOSIS — J849 Interstitial pulmonary disease, unspecified: Secondary | ICD-10-CM

## 2023-02-17 NOTE — Patient Instructions (Signed)
I am glad you are doing well with your breathing Your labs are normal Continue the medications as prescribed Continue the antiacid medication Will order high-res CT and PFTs in 3 months Return to clinic after these tests

## 2023-02-17 NOTE — Progress Notes (Signed)
Chelsea [place]    [id number]    1971-07-03  Primary Care Physician:Hicks, Sherin Quarry, MD  Referring Physician: Agustina Caroli, MD [address] [place],  [place] [address]  Chief complaint: Follow-up for CTD related interstitial lung disease, NSIP  HPI: 52 y.o.  with history of hypertension, unspecified connective tissue disease, NSIP interstitial lung disease here to establish care She was previously followed by Dr.  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  She was put on prednisone 40 mg and then CellCept which is titrated up to 1 g twice daily.  She was able to be weaned off prednisone in December 2021 with improvement in symptoms of dyspnea. She also follows with Dr. Gevena Mart, rheumatology at the [facility] and is on Plaquenil. She completed pulmonary rehab in 2021  States that her dyspnea on exertion has been stable on CellCept.  She is also on Bactrim for prophylaxis. Had echocardiogram in 2021 at the [place] with no evidence of pulmonary hypertension  She also has history of GERD and was on omeprazole but developed side effects of rash.  She is currently on pantoprazole 20 mg a day which appears to control her symptoms Postnasal drip was treated with over-the-counter antihistamines and nasal spray.  She was on chlorpheniramine which at least helped with her anxiety.  2023 She developed COVID-19 in January 2023 and treated as an outpatient with Paxlovid.  Subsequently she had treatment with Z-Pak and amoxicillin for persistent symptoms.    Started Durel Salts in July 2023.  This was poorly tolerated due to diarrhea and fatigue Started Esbriet in November 2023 which is better tolerated She is on PPI twice daily for cough  Pets: She is to have cats in the past.  No pets currently Occupation: Works for [employer].  Previously in the [employer] Exposures: Exposed to chemicals at [employer] camp in  [place] Smoking history: Never smoker Travel history: No significant recent travel Relevant family history:No significant family history of lung disease  Interim History: Continues on Esbriet and CellCept without any issue.  States that breathing is doing well On PPI twice daily but continues to have indigestion and acid reflux Started on simethicone by her primary care recently.  Outpatient Encounter Medications as of 02/17/2023  Medication Sig   amLODipine (NORVASC) 5 MG tablet Take 5 mg by mouth daily.   benzonatate (TESSALON) 200 MG capsule Take 1 capsule (200 mg total) by mouth 3 (three) times daily as needed for cough.   clobetasol ointment (TEMOVATE) 0.05 % Apply 1 application topically 2 (two) times daily.   dorzolamide-timolol (COSOPT) 22.3-6.8 MG/ML ophthalmic solution 1 drop 2 (two) times daily.   hydroxychloroquine (PLAQUENIL) 200 MG tablet Take 200 mg by mouth 2 (two) times daily.   latanoprost (XALATAN) 0.005 % ophthalmic solution Place 1 drop into both eyes at bedtime.   levocetirizine (XYZAL) 5 MG tablet TAKE 1 TABLET BY MOUTH EVERY DAY IN THE EVENING   mycophenolate (CELLCEPT) 500 MG tablet TAKE 3 TABLETS BY MOUTH TWICE DAILY.   pantoprazole (PROTONIX) 40 MG tablet Take 1 tablet (40 mg total) by mouth 2 (two) times daily.   Pirfenidone (ESBRIET) 267 MG TABS Month 2 and onwards: take 3 tablets by mouth three times daily with meals   simethicone (MYLICON) 80 MG chewable tablet Chew 80 mg by mouth.   sulfamethoxazole-trimethoprim (BACTRIM DS) 800-160 MG tablet TAKE 1  TABLET BY MOUTH THREE TIMES A WEEK.   [DISCONTINUED] Pirfenidone 267 MG TABS Month 1: Take 1 tab three times daily for 7 days, then 2 tabs three times daily for 7 days, then 3 tabs three times daily thereafter. (Patient not taking: Reported on 12/02/2022)   No facility-administered encounter medications on file as of 02/17/2023.    Physical Exam: Blood pressure 118/72, pulse 80, height 5\' 7"  (1.702 m), weight 214  lb 3.2 oz (97.2 kg), SpO2 100 %. Gen:      No acute distress HEENT:  EOMI, sclera anicteric Neck:     No masses; no thyromegaly Lungs:    Clear to auscultation bilaterally; normal respiratory effort CV:         Regular rate and rhythm; no murmurs Abd:      + bowel sounds; soft, non-tender; no palpable masses, no distension Ext:    No edema; adequate peripheral perfusion Skin:      Warm and dry; no rash Neuro: alert and oriented x 3 Psych: normal mood and affect   Data Reviewed: Reviewed data from the [place] PFTs 2015-FVC 3.05 [79%], TLC 4.39 [80%], DLCO 65% 02/2020-FVC 2.47 [72%], TLC 3.39 [60%], DLCO 42%  CT chest 03/2020-baseline ILD with subpleural sparing, groundglass opacities with traction bronchiectasis.  Cysts versus honeycombing at the base.  Worsening compared to 2019  Echocardiogram May 2021-no pulmonary hypertension, positive for diastolic dysfunction  Imaging: High-res CT 09/14/2021-probable UIP pattern interstitial pneumonia, soft tissue mass in the anterior mediastinum, low-attenuation lesion in the liver.  MRI abdomen 10/13/2021-benign hepatic hemangiomas in the liver  CT chest 05/11/2022-Mild progression of interstitial lung disease with suggestion of honeycombing. I have reviewed the images personally.  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  She is off prednisone since 2021.  We will just place back on it due to post-COVID inflammation.  She has tapered off prednisone in June 2023.    Currently on CellCept at 1.5 g, Bactrim and Plaquenil Ofev is poorly tolerated and has started Esbriet in November 2023 Continue close monitoring for progression.   She may be a good transplant candidate as well. LFTs earlier this month are normal  Post COVID-19 Suspect COVID may have set off progression of her ILD.  She is off prednisone.  On antifibrotics as above  Chronic cough Felt to be multifactorial from interstitial lung disease, GERD, postnasal drip Continue PPI twice daily, over-the-counter antihistamine  Liver lesions Noted on high-res CT Follow-up MRI shows benign hemangiomas  Plan/Recommendations: Continue CellCept, Bactrim, Plaquenil Continue Esbriet Follow-up high-res CT and PFTs in 3 months.  Chilton Greathouse MD [facility] 02/17/2023, 10:20 AM  CC: Agustina Caroli, MD

## 2023-02-17 NOTE — Addendum Note (Signed)
Addended by: Maurene Capes on: 02/17/2023 10:45 AM   Modules accepted: Orders

## 2023-03-29 ENCOUNTER — Telehealth: Payer: Self-pay | Admitting: Pulmonary Disease

## 2023-03-29 NOTE — Telephone Encounter (Signed)
This is not due till July we will call her and scheduled it

## 2023-03-29 NOTE — Telephone Encounter (Signed)
Patient would like to schedule CT scan. Patient phone number is [phone]. Patient not available after 3:30 pm.

## 2023-05-05 ENCOUNTER — Ambulatory Visit
Admission: RE | Admit: 2023-05-05 | Discharge: 2023-05-05 | Disposition: A | Discharge status: Home / Self Care | Payer: No Typology Code available for payment source | Source: Ambulatory Visit | Attending: Pulmonary Disease

## 2023-05-05 DIAGNOSIS — J849 Interstitial pulmonary disease, unspecified: Secondary | ICD-10-CM

## 2023-05-15 ENCOUNTER — Other Ambulatory Visit: Payer: No Typology Code available for payment source

## 2023-05-31 ENCOUNTER — Ambulatory Visit (INDEPENDENT_AMBULATORY_CARE_PROVIDER_SITE_OTHER): Payer: No Typology Code available for payment source | Admitting: Pulmonary Disease

## 2023-05-31 DIAGNOSIS — J849 Interstitial pulmonary disease, unspecified: Secondary | ICD-10-CM

## 2023-05-31 NOTE — Patient Instructions (Signed)
Full PFT performed today. °

## 2023-05-31 NOTE — Progress Notes (Signed)
Full PFT performed today. °

## 2023-06-01 LAB — PULMONARY FUNCTION TEST
DL/VA % pred: 89 %
DL/VA: 3.77 ml/min/mmHg/L
DLCO cor % pred: 57 %
DLCO cor: 13.39 ml/min/mmHg
DLCO unc % pred: 57 %
DLCO unc: 13.39 ml/min/mmHg
FEF 25-75 Post: 2.4 L/sec
FEF 25-75 Pre: 1.97 L/sec
FEF2575-%Change-Post: 22 %
FEF2575-%Pred-Post: 82 %
FEF2575-%Pred-Pre: 67 %
FEV1-%Change-Post: 4 %
FEV1-%Pred-Post: 70 %
FEV1-Post: 2.16 L
FEV1-Pre: 2.06 L
FEV1FVC-%Change-Post: 4 %
FEV1FVC-%Pred-Pre: 101 %
FEV6-%Change-Post: 0 %
FEV6-%Pred-Post: 67 %
FEV6-%Pred-Pre: 67 %
FEV6-Post: 2.57 L
FEV6FVC-%Pred-Post: 102 %
FVC-%Change-Post: 0 %
FVC-%Pred-Post: 66 %
FVC-%Pred-Pre: 65 %
FVC-Post: 2.57 L
Post FEV1/FVC ratio: 84 %
Pre FEV1/FVC ratio: 81 %
Pre FEV6/FVC Ratio: 100 %
RV % pred: 60 %
RV: 1.19 L
TLC % pred: 72 %
TLC: 3.99 L

## 2023-06-02 ENCOUNTER — Ambulatory Visit: Payer: No Typology Code available for payment source | Attending: Internal Medicine | Admitting: Internal Medicine

## 2023-06-02 ENCOUNTER — Encounter: Payer: Self-pay | Admitting: Internal Medicine

## 2023-06-02 VITALS — BP 107/64 | HR 81 | Temp 98.4°F | Ht 67.0 in | Wt 213.0 lb

## 2023-06-02 DIAGNOSIS — J849 Interstitial pulmonary disease, unspecified: Secondary | ICD-10-CM

## 2023-06-02 DIAGNOSIS — I10 Essential (primary) hypertension: Secondary | ICD-10-CM

## 2023-06-02 DIAGNOSIS — R232 Flushing: Secondary | ICD-10-CM | POA: Diagnosis not present

## 2023-06-02 DIAGNOSIS — K219 Gastro-esophageal reflux disease without esophagitis: Secondary | ICD-10-CM

## 2023-06-02 DIAGNOSIS — R053 Chronic cough: Secondary | ICD-10-CM

## 2023-06-02 DIAGNOSIS — Z1211 Encounter for screening for malignant neoplasm of colon: Secondary | ICD-10-CM

## 2023-06-02 DIAGNOSIS — Z7689 Persons encountering health services in other specified circumstances: Secondary | ICD-10-CM

## 2023-06-02 MED ORDER — SUCRALFATE 1 G PO TABS: ORAL_TABLET | ORAL | 1 refills | Status: DC

## 2023-06-02 MED ORDER — ALBUTEROL SULFATE HFA 108 (90 BASE) MCG/ACT IN AERS
2.0000 | INHALATION_SPRAY | Freq: Four times a day (QID) | RESPIRATORY_TRACT | 2 refills | Status: DC | PRN
Start: 2023-06-02 — End: 2023-08-21

## 2023-06-02 NOTE — Progress Notes (Signed)
Patient ID: Chelsea [place], female    DOB: [birth year]  MRN: [id number]  CC: Establish Care (Est care / new pt/Requesting albuterol inhaler)   Subjective: Chelsea [place] is a 52 y.o. female who presents for new pt visit Her concerns today include:  Patient with history of HTN, GERD, Fhx breast CA (followed at [facility] 6 mths Dr. 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  Followed by rheumatology at [facility] and pulmonary Dr. Isaiah Serge), gastric sleeve  [facility].  Reports PCP there is overwhelm so her breast doctor recommend she gets PCP outside of [place]  C/o hot flashes since having total hysterectomy 06/2018.  Hot flashes interferes with sleep.  She tries to keep the air conditioning set low.  Taking a Soy Pill (June 2024) from on-line which helps some.  Also doing acupuncture once a wk, 2 treatments so far. Saw oncology at [facility], advised against HRT given family history of breast CA and + BARD1 gene   ILD/nonspecific CTD:  followed by Dr. Isaiah Serge and rheumatology at the [facility]. Currently on CellCept and pirfenidone through Dr. Isaiah Serge and Plaquenil through her rheumatologist.  She takes Bactrim for prophylaxis.. Was on Prednisone for 1 yr until 10/2021. Has chronic dry  cough but more annoying x 1 mth. No increase SOB but burning in chest with coughing.  Given Albuterol inhaler when she did PFTs 2 days ago.  Felt whole chest relax after using Albuterol inhaler as part of PFT.  Wants Albuterol until she sees Dr. Isaiah Serge next mth on 13th  HTN:  On Norvasc 5 mg Checks BP 3-4x/wk.  Good readings  Fhx Breast: 2 breast bx negative.  Last mammogram was 11/2022 at the [place].  Reports having had 2 breast biopsies in the past which were negative.  GERD: Reports pirfenidone has increased GERD symptoms despite being on pantoprazole 40 mg twice a day.  She has intermittent vomiting when she eats.  History of laparoscopic  gastric band removal with laparoscopic gastric sleeve placement.  Followed by GI at the [facility].  Has upcoming appointment for EGD and abdominal MRI  HM:  last MM[facility] 11/[facility]22 [facility]. Has done FIT test through [facility] 06/2023.  Had hysterectomy 06/2018 due to fibroids/heavy bleeding Patient Active Problem List   Diagnosis Date Noted   GERD (gastroesophageal reflux disease) 10/26/2022   BARD1 gene mutation positive 12/29/2021   Genetic testing 12/16/2021   Family history of breast cancer 12/06/2021   COVID-19 virus infection 11/30/2021   Acute bronchitis 11/30/2021   ILD (interstitial lung disease) (HCC) 11/30/2021     Current Outpatient Medications on File Prior to Visit  Medication Sig Dispense Refill   amLODipine (NORVASC) 5 MG tablet Take 5 mg by mouth daily.     benzonatate (TESSALON) 200 MG capsule Take 1 capsule (200 mg total) by mouth 3 (three) times daily as needed for cough. 45 capsule 2   clobetasol ointment (TEMOVATE) 0.05 % Apply 1 application topically 2 (two) times daily.     dorzolamide-timolol (COSOPT) 22.3-6.8 MG/ML ophthalmic solution 1 drop 2 (two) times daily.     hydroxychloroquine (PLAQUENIL) 200 MG tablet Take 200 mg by mouth 2 (two) times daily.     latanoprost (XALATAN) 0.005 % ophthalmic solution Place 1 drop into both eyes at bedtime.     levocetirizine (XYZAL) 5 MG tablet TAKE 1 TABLET BY MOUTH EVERY DAY IN THE EVENING 90 tablet 1  mycophenolate (CELLCEPT) 500 MG tablet TAKE 3 TABLETS BY MOUTH TWICE DAILY. 180 tablet 5   pantoprazole (PROTONIX) 40 MG tablet Take 1 tablet (40 mg total) by mouth 2 (two) times daily. 60 tablet 5   Pirfenidone (ESBRIET) 267 MG TABS Month 2 and onwards: take 3 tablets by mouth three times daily with meals 270 tablet 4   simethicone (MYLICON) 80 MG chewable tablet Chew 80 mg by mouth.     sulfamethoxazole-trimethoprim (BACTRIM DS) 800-160 MG tablet TAKE 1 TABLET BY MOUTH THREE TIMES A WEEK. 39 tablet 1   No current facility-administered medications  on file prior to visit.    Allergies  Allergen Reactions   Nintedanib Diarrhea and Nausea Only    Dizziness, fatigue   Prilosec [Omeprazole] Other (See Comments)    Alopecia   Famotidine Anxiety and Palpitations   Premarin [Estrogens Conjugated] Rash    Social History   Socioeconomic History   Marital status: Single    Spouse name: Not on file   Number of children: 0   Years of education: Not on file   Highest education level: Associate degree: academic program  Occupational History   Occupation: Retail banker: [employer]  Tobacco Use   Smoking status: Never   Smokeless tobacco: Never  Vaping Use   Vaping status: Never Used  Substance and Sexual Activity   Alcohol use: Not Currently   Drug use: Never   Sexual activity: Not on file  Other Topics Concern   Not on file  Social History Narrative   Not on file   Social Determinants of Health   Financial Resource Strain: Not on file  Food Insecurity: Not on file  Transportation Needs: Not on file  Physical Activity: Not on file  Stress: Not on file  Social Connections: Not on file  Intimate Partner Violence: Not on file    Family History  Problem Relation Age of Onset   Breast cancer Mother        dx 56s; dx 24 (bilateral w/ mets)   Throat cancer Maternal Uncle    Breast cancer Paternal Aunt        dx 48s   Breast cancer Paternal Grandmother        dx before 73   Brain cancer Other        MGM's brothers x2   Lung cancer Other        MGM's brother    Past Surgical History:  Procedure Laterality Date   ABDOMINAL HYSTERECTOMY     LAPAROSCOPIC GASTRIC BAND REMOVAL WITH LAPAROSCOPIC GASTRIC SLEEVE RESECTION  04/2015    ROS: Review of Systems Negative except as stated above  PHYSICAL EXAM: BP 107/64 (BP Location: Left Arm, Patient Position: Sitting, Cuff Size: Normal)   Pulse 81   Temp 98.4 F (36.9 C) (Oral)   Ht 5\' 7"  (1.702 m)   Wt 213 lb (96.6 kg)   SpO2 100%   BMI 33.36 kg/m    Physical Exam  General appearance - alert, well appearing, middle-aged African-American female and in no distress.  She has intermittent dry cough in my presence. Mental status - normal mood, behavior, speech, dress, motor activity, and thought processes Neck - supple, no significant adenopathy Chest - clear to auscultation, no wheezes, rales or rhonchi, symmetric air entry Heart - normal rate, regular rhythm, normal S1, S2, no murmurs, rubs, clicks or gallops Extremities - peripheral pulses normal, no pedal edema, no clubbing or cyanosis  Latest Ref Rng & Units 02/15/2023    4:28 PM 01/02/2023    3:46 PM 12/02/2022    1:33 PM  CMP  Glucose 70 - 99 mg/dL 74  88  90   BUN 6 - 23 mg/dL 10  10  9    Creatinine 0.40 - 1.20 mg/dL 3.24  4.01  0.27   Sodium 135 - 145 mEq/L 138  140  139   Potassium 3.5 - 5.1 mEq/L 3.6  3.9  3.7   Chloride 96 - 112 mEq/L 103  106  104   CO2 19 - 32 mEq/L 27  27  29    Calcium 8.4 - 10.5 mg/dL 9.8  9.6  9.4   Total Protein 6.0 - 8.3 g/dL 7.8  7.5  7.6   Total Bilirubin 0.2 - 1.2 mg/dL 0.3  0.3  0.4   Alkaline Phos 39 - 117 U/L 64  61  59   AST 0 - 37 U/L 14  12  14    ALT 0 - 35 U/L 13  13  12     Lipid Panel  No results found for: "CHOL", "TRIG", "HDL", "CHOLHDL", "VLDL", "LDLCALC", "LDLDIRECT"  CBC    Component Value Date/Time   WBC 3.7 (L) 12/02/2022 1333   RBC 4.16 12/02/2022 1333   HGB 12.2 12/02/2022 1333   HCT 37.8 12/02/2022 1333   PLT 330.0 12/02/2022 1333   MCV 90.9 12/02/2022 1333   MCHC 32.3 12/02/2022 1333   RDW 13.2 12/02/2022 1333   LYMPHSABS 0.9 12/02/2022 1333   MONOABS 0.3 12/02/2022 1333   EOSABS 0.0 12/02/2022 1333   BASOSABS 0.0 12/02/2022 1333    ASSESSMENT AND PLAN: 1. Establishing care with new doctor, encounter for   2. Chronic cough Patient reports this had improved some since being on CellCept and pirfenidone but has worsened over the past several weeks.  We will try her with albuterol inhaler since she reports  some improvement with this when she was asked to use it during recent PFTs.  Results of PFTs not back yet.  Keep upcoming appointment with her pulmonologist Dr. Isaiah Serge. - albuterol (VENTOLIN HFA) 108 (90 Base) MCG/ACT inhaler; Inhale 2 puffs into the lungs every 6 (six) hours as needed for wheezing or shortness of breath.  Dispense: 8 g; Refill: 2  3. Hot flash not due to menopause Discussed nonhormonal med Veozah.  Advised that with this medication, we would have to check liver function test at baseline then at 3 months, 6 months and 9 months.  Medication can cause injury to the liver.  Other common side effect is headache.  She is willing to give the medication a try.  I told her I will send prescription to her pharmacy once I get the results of the liver function test.  4. ILD (interstitial lung disease) (HCC) See #2 above Plugged in with pulmonary - albuterol (VENTOLIN HFA) 108 (90 Base) MCG/ACT inhaler; Inhale 2 puffs into the lungs every 6 (six) hours as needed for wheezing or shortness of breath.  Dispense: 8 g; Refill: 2  5. Essential hypertension At goal.  Continue amlodipine 5 mg daily  6. Gastroesophageal reflux disease without esophagitis Worsened by pirfenidone.  Continue pantoprazole twice a day.  Will add sucralfate for her to take as needed with meals.  Keep scheduled appointment for EGD with her gastroenterologist - sucralfate (CARAFATE) 1 g tablet; 1 tab PO three times daily with meals PRN  Dispense: 60 tablet; Refill: 1 - Hepatic Function Panel  7. Screening for colon cancer - Fecal occult blood, imunochemical([facility])     Patient was given the opportunity to ask questions.  Patient verbalized understanding of the plan and was able to repeat key elements of the plan.   This documentation was completed using Paediatric nurse.  Any transcriptional errors are unintentional.  Orders Placed This Encounter  Procedures   Fecal occult blood,  imunochemical([facility])   Hepatic Function Panel     Requested Prescriptions   Signed Prescriptions Disp Refills   albuterol (VENTOLIN HFA) 108 (90 Base) MCG/ACT inhaler 8 g 2    Sig: Inhale 2 puffs into the lungs every 6 (six) hours as needed for wheezing or shortness of breath.   sucralfate (CARAFATE) 1 g tablet 60 tablet 1    Sig: 1 tab PO three times daily with meals PRN    Return in about 4 months (around 10/03/2023).  Jonah Blue, MD, FACP

## 2023-06-02 NOTE — Patient Instructions (Signed)
  We will check your liver function test today.  If it is normal, we will try you with the medication Veozah for the hot flashes.  The medication can sometimes cause headache.  We will need to monitor your liver function test.  I have prescribed Carafate to use as needed with your meals to try to decrease the acid reflux until you are seen by a gastroenterologist again.  Prescription sent to your pharmacy for the albuterol inhaler.

## 2023-06-03 ENCOUNTER — Other Ambulatory Visit: Payer: Self-pay | Admitting: Internal Medicine

## 2023-06-03 MED ORDER — VEOZAH 45 MG PO TABS: 45.0000 mg | ORAL_TABLET | Freq: Every day | ORAL | 1 refills | Status: DC

## 2023-06-06 ENCOUNTER — Telehealth: Payer: Self-pay | Admitting: Internal Medicine

## 2023-06-06 NOTE — Telephone Encounter (Signed)
Pt is calling in because she was told by the Pharmacy that she needs a prior authorization for the medication Fezolinetant (VEOZAH) 45 MG TABS [[id number]] . [id number] - [place], [place] - [address] .

## 2023-06-07 ENCOUNTER — Other Ambulatory Visit: Payer: Self-pay

## 2023-06-07 ENCOUNTER — Telehealth: Payer: Self-pay

## 2023-06-07 NOTE — Telephone Encounter (Signed)
Yes ma'am, primary dx is R23.2

## 2023-06-07 NOTE — Telephone Encounter (Signed)
A prior authorization for Chelsea [place] was submitted to ins today via CoverMyMeds Key: [id number]

## 2023-06-08 ENCOUNTER — Other Ambulatory Visit: Payer: Self-pay

## 2023-06-12 ENCOUNTER — Ambulatory Visit: Payer: No Typology Code available for payment source | Attending: Internal Medicine

## 2023-06-20 ENCOUNTER — Ambulatory Visit (INDEPENDENT_AMBULATORY_CARE_PROVIDER_SITE_OTHER): Payer: No Typology Code available for payment source | Admitting: Nurse Practitioner

## 2023-06-20 ENCOUNTER — Encounter: Payer: Self-pay | Admitting: Nurse Practitioner

## 2023-06-20 VITALS — BP 120/70 | HR 73 | Ht 67.0 in | Wt 217.2 lb

## 2023-06-20 DIAGNOSIS — J849 Interstitial pulmonary disease, unspecified: Secondary | ICD-10-CM

## 2023-06-20 DIAGNOSIS — J208 Acute bronchitis due to other specified organisms: Secondary | ICD-10-CM | POA: Diagnosis not present

## 2023-06-20 DIAGNOSIS — Z5181 Encounter for therapeutic drug level monitoring: Secondary | ICD-10-CM | POA: Diagnosis not present

## 2023-06-20 DIAGNOSIS — K219 Gastro-esophageal reflux disease without esophagitis: Secondary | ICD-10-CM | POA: Diagnosis not present

## 2023-06-20 MED ORDER — PREDNISONE 20 MG PO TABS
40.0000 mg | ORAL_TABLET | Freq: Every day | ORAL | 0 refills | Status: AC
Start: 2023-06-20 — End: 2023-06-25

## 2023-06-20 MED ORDER — HYDROCODONE BIT-HOMATROP MBR 5-1.5 MG/5ML PO SOLN: 5.0000 mL | Freq: Three times a day (TID) | ORAL | 0 refills | Status: DC | PRN

## 2023-06-20 MED ORDER — BENZONATATE 200 MG PO CAPS
200.0000 mg | ORAL_CAPSULE | Freq: Three times a day (TID) | ORAL | 1 refills | Status: AC | PRN
Start: 2023-06-20 — End: ?

## 2023-06-20 NOTE — Progress Notes (Signed)
@Patient  ID: Chelsea Jennings, female    DOB: 01-24-1971, 52 y.o.   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  She is a patient Dr. Shirlee More last seen in office 02/17/2023.  Past medical history significant for hypertension, GERD.  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  She was put on prednisone 40 mg and then CellCept which was titrated up to 1 g daily.  She was able to be weaned off prednisone in December 2021 with improvement in symptoms of dyspnea.  She follows with Dr. Gevena Mart, rheumatology at the [facility] and is on Plaquenil.  She completed pulmonary rehab in 2021.  Pets: She is to have cats in the past.  No pets currently Occupation: Works for [employer].  Previously in the [employer] Exposures: Exposed to chemicals at [employer] camp in [place] Smoking history: Never smoker Travel history: No significant recent travel Relevant family history:No significant family history of lung disease  TEST/EVENTS:  11/10/2021 PFT: FVC 81, FEV1 81, ratio 86, TLC 68, DLCO 48.  No significant BD 05/11/2022 HRCT chest: Mild progression of ILD, again unusual in appearance but given the spectrum of findings and the mild progression, highly suspicious for UIP. 05/05/2023 HRCT chest: atherosclerosis. Extensive ILD without evidence of progression. There is lower lobe predominance. Liver mass, stable. 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  05/31/2023 PFT: FVC 65, FEV1 67, ratio 84, TLC 72, DLCOcor 57  05/17/2022: OV with Dr. Isaiah Serge.  Recently finished prednisone taper and feeling well with no issues.  Here for review of CT scan which showed mild progression of ILD with suggestion of honeycombing.  Decided to move forward with  antifibrotic therapy with Ofev.  May be a good transplant candidate as well.  08/12/2022: Ov with Tigran Haynie NP for follow up and to discuss Ofev side effects. She had started on Ofev in July; she started with one dose daily and had significant nausea with a few episodes of vomiting. She tried taking it with different food options without any change. She stopped it after 5 days and felt better immediately. Today, she reports that her breathing has been stable. She can complete activities of daily living without difficulty. No increased cough or chest congestion. She's taking CellCept 1.5 g Twice daily and still on plaquenil. She's feeling relatively well overall. Shared decision to stop Ofev and start on Esbriet. Labs stable.  10/26/2022: Ov with Tabby Beaston NP for follow up. She started on Esbriet the first week of November 2023. She has been tolerating this very well. Her only major side effect has been increased reflux. She switch from omeprazole to nexium, which initially helped but now she feels like her indigestion is back. She denies any changes in appetite, nausea, vomiting, difficulties swallowing, hoarse voice. Her breathing is stable. No worsening cough or chest congestion.   06/20/2023: Today- follow up Patient presents today for follow up after CT chest and PFT. CT chest was stable. PFTs showed a decline in lung function by about 10% but DLCO was improved from 48% to 57%. She tells me that she was having a lot of trouble with her reflux and cough during the time of the test. She felt like her chest was tight and she was wheezing.  She felt more short winded. She was almost vomited the reflux was so severe. She went to her PCP who prescribed carafate, which she started on 7/30, and she feels significantly better. Her reflux symptoms have resolved. Her cough has also improved; although, not entirely gone. Still dry and reactive. Occasional wheeze but overall better. Feels like her breathing is back to her  baseline. She denies any fevers, chills, hemoptysis, night sweats, leg swelling, orthopnea. She has not had to use the rescue inhaler prescribed by her PCP over the last week. She is still taking Esbriet and tolerating well aside from her reflux.   Allergies  Allergen Reactions   Nintedanib Diarrhea and Nausea Only    Dizziness, fatigue   Prilosec [Omeprazole] Other (See Comments)    Alopecia   Famotidine Anxiety and Palpitations   Premarin [Estrogens Conjugated] Rash    Immunization History  Administered Date(s) Administered   Covid-19, Mrna,Vaccine(Spikevax)52yrs and older 02/10/2023   Influenza,inj,Quad PF,6+ Mos 08/28/2017, 08/03/2018, 08/09/2019, 09/11/2020, 09/02/2021, 08/12/2022   Moderna Sars-Covid-2 Vaccination 12/12/2019, 01/09/2020, 08/07/2020, 04/13/2021   Pneumococcal Conjugate-13 01/13/2015, 04/23/2020   Pneumococcal Polysaccharide-23 05/07/2013, 08/29/2013    Past Medical History:  Diagnosis Date   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 8 g 2   amLODipine (NORVASC) 5 MG tablet Take 5 mg by mouth daily.     clobetasol ointment (TEMOVATE) 0.05 % Apply 1 application topically 2 (two) times daily.     dorzolamide-timolol (COSOPT) 22.3-6.8 MG/ML ophthalmic solution 1 drop 2 (two) times daily.     Fezolinetant (VEOZAH) 45 MG TABS Take 1 tablet (45 mg total) by mouth daily. 30 tablet 1   hydroxychloroquine (PLAQUENIL) 200 MG tablet Take 200 mg by mouth 2 (two)  times daily.     latanoprost (XALATAN) 0.005 % ophthalmic solution Place 1 drop into both eyes at bedtime.     levocetirizine (XYZAL) 5 MG tablet TAKE 1 TABLET BY MOUTH EVERY DAY IN THE EVENING 90 tablet 1   mycophenolate (CELLCEPT) 500 MG tablet TAKE 3 TABLETS BY MOUTH TWICE DAILY. 180 tablet 5   pantoprazole (PROTONIX) 40 MG tablet Take 1 tablet (40 mg total) by mouth 2 (two) times daily. 60 tablet 5   Pirfenidone (ESBRIET) 267 MG TABS Month 2 and onwards: take 3 tablets by mouth three times daily with meals 270 tablet 4   simethicone (MYLICON) 80 MG chewable tablet Chew 80 mg by mouth.     sucralfate (CARAFATE) 1 g tablet 1 tab PO three times daily with meals PRN 60 tablet 1   sulfamethoxazole-trimethoprim (BACTRIM DS) 800-160 MG tablet TAKE 1 TABLET BY MOUTH THREE TIMES A WEEK. 39 tablet 1   benzonatate (TESSALON) 200 MG capsule Take 1 capsule (200 mg total) by mouth 3 (three) times daily as needed for cough. 45 capsule 2   No facility-administered medications prior to visit.     Review of Systems:   Constitutional: No weight loss or gain, night sweats,  fevers, chills, or lassitude. +fatigue (baseline) HEENT: No headaches, difficulty swallowing, tooth/dental problems, or sore throat. No sneezing, itching, ear ache, nasal congestion, or post nasal drip CV:  No chest pain, orthopnea, PND, swelling in lower extremities, anasarca, dizziness, palpitations, syncope Resp: +shortness of breath with exertion (baseline); dry cough; occasional wheeze. No excess mucus or change in color of mucus. No productive or non-productive. No hemoptysis. No chest wall deformity GI:  +heartburn/indigestion (improved). No abdominal pain, nausea, vomiting, diarrhea, change in bowel habits, loss of appetite, bloody stools.  MSK:  No joint pain or swelling.   Neuro: No dizziness or lightheadedness.  Psych: No depression or anxiety. Mood stable.     Physical Exam:  BP 120/70   Pulse 73   Ht 5\' 7"  (1.702 m)    Wt 217 lb 3.2 oz (98.5 kg)   SpO2 97%   BMI 34.02 kg/m   GEN: Pleasant, interactive, well-appearing; obese; in no acute distress. HEENT:  Normocephalic and atraumatic. PERRLA. Sclera white. Nasal turbinates pink, moist and patent bilaterally. No rhinorrhea present. Oropharynx pink and moist, without exudate or edema. No lesions, ulcerations, or postnasal drip.  NECK:  Supple w/ fair ROM. No LAD.   CV: RRR, no m/r/g, no peripheral edema. Pulses intact, +2 bilaterally. No cyanosis, pallor or clubbing. PULMONARY:  Unlabored, regular breathing. Mild bibasilar crackles. End expiratory wheeze bilaterally A&P. No accessory muscle use.  GI: BS present and normoactive. Soft, non-tender to palpation. No organomegaly or masses detected.  MSK: No erythema, warmth or tenderness. Cap refil <2 sec all extrem. No deformities or joint swelling noted.  Neuro: A/Ox3. No focal deficits noted.   Skin: Warm, no lesions or rashe Psych: Normal affect and behavior. Judgement and thought content appropriate.     Lab Results:  CBC    Component Value Date/Time   WBC 3.7 (L) 12/02/2022 1333   RBC 4.16 12/02/2022 1333   HGB 12.2 12/02/2022 1333   HCT 37.8 12/02/2022 1333   PLT 330.0 12/02/2022 1333   MCV 90.9 12/02/2022 1333   MCHC 32.3 12/02/2022 1333   RDW 13.2 12/02/2022 1333   LYMPHSABS 0.9 12/02/2022 1333   MONOABS 0.3 12/02/2022 1333   EOSABS 0.0 12/02/2022 1333   BASOSABS 0.0 12/02/2022 1333    BMET    Component Value Date/Time   NA 138 02/15/2023 1628   K 3.6 02/15/2023 1628   CL 103 02/15/2023 1628   CO2 27 02/15/2023 1628   GLUCOSE 74 02/15/2023 1628   BUN 10 02/15/2023 1628   CREATININE 0.94 02/15/2023 1628   CALCIUM 9.8 02/15/2023 1628    BNP No results found for: "BNP"   Imaging:  No results found.  Administration History     None          Latest Ref Rng & Units 05/31/2023    1:31 PM 11/10/2021   10:01 AM  PFT Results  FVC-Pre L  2.63   FVC-Predicted Pre % 65  81    FVC-Post L 2.57  2.55   FVC-Predicted Post % 66  78   Pre FEV1/FVC % % 81  81   Post FEV1/FCV % % 84  86   FEV1-Pre L 2.06  2.12   FEV1-Predicted Pre % 67  81   FEV1-Post L 2.16  2.20   DLCO uncorrected ml/min/mmHg 13.39  11.22   DLCO UNC% % 57  48   DLCO corrected ml/min/mmHg 13.39  11.22   DLCO COR %Predicted % 57  48  DLVA Predicted % 89  75   TLC L 3.99  3.75   TLC % Predicted % 72  68   RV % Predicted % 60  52     No results found for: "NITRICOXIDE"      Assessment & Plan:   Acute bronchitis Bronchitic symptoms exacerbated by GERD. Improvement with carafate therapy but still struggling with some residual reactive cough and wheeze. Will treat her with prednisone burst and cough control measures. Continue PRN SABA. Return precautions advised.   Patient Instructions  Continue Esbriet 3 pills Three times a day, which will be your maintenance dose.  -Take it with food.  -Make sure you have 5-6 hours between doses.  -Wear sunscreen when outside  Continue CellCept 1.5 g Twice daily  Continue plaquenil 200 mg Twice daily  Continue Albuterol inhaler 2 puffs every 6 hours as needed for shortness of breath or wheezing. Notify if symptoms persist despite rescue inhaler/neb use.  Continue pantoprazole (protonix) 1 tab daily - 30 min before breakfast. If you feel like you need to go back up to twice daily dosing, you can.  Continue carafate as directed by your primary care provider   Prednisone 40 mg daily for 5 days. Take in AM with food. Hycodan cough syrup 5 mL every 8 hours as needed for cough. Do not drive after taking. May cause drowsiness Benzonatate 1 capsule Three times a day for cough    Follow up with rheumatology as scheduled   Labs today - CMET  Repeat spirometry/DLCO in 10 weeks    Follow up in 10 weeks after 30 minute PFT with Dr. Isaiah Serge or Katie Rogerick Baldwin,NP. Marland Kitchen If symptoms do not improve or worsen, please contact office for sooner follow up or seek emergency  care.    ILD (interstitial lung disease) (HCC) CT ILD with NSIP fibrosis. Felt as though COVID set off progression of disease. Started Esbriet November 2023. Tolerating well aside from increased GERD. Unable to tolerate Ofev. Maintained on CellCept and Plaquenil for CT disease. Stable on recent imaging. Decline in lung function, likely due to acute symptoms. She had improvement in diffusing capacity. Will repeat spiro/DLCO in 10 weeks.   GERD (gastroesophageal reflux disease) Significant improvement with carafate. Ok to decrease PPI dosing to once daily. If symptoms return/worsen, resume twice daily. GERD precautions advised.     I spent 35 minutes of dedicated to the care of this patient on the date of this encounter to include pre-visit review of records, face-to-face time with the patient discussing conditions above, post visit ordering of testing, clinical documentation with the electronic health record, making appropriate referrals as documented, and communicating necessary findings to members of the patients care team.  Noemi Chapel, NP 06/20/2023  Pt aware and understands NP's role.

## 2023-06-20 NOTE — Patient Instructions (Addendum)
Continue Esbriet 3 pills Three times a day, which will be your maintenance dose.  -Take it with food.  -Make sure you have 5-6 hours between doses.  -Wear sunscreen when outside  Continue CellCept 1.5 g Twice daily  Continue plaquenil 200 mg Twice daily  Continue Albuterol inhaler 2 puffs every 6 hours as needed for shortness of breath or wheezing. Notify if symptoms persist despite rescue inhaler/neb use.  Continue pantoprazole (protonix) 1 tab daily - 30 min before breakfast. If you feel like you need to go back up to twice daily dosing, you can.  Continue carafate as directed by your primary care provider   Prednisone 40 mg daily for 5 days. Take in AM with food. Hycodan cough syrup 5 mL every 8 hours as needed for cough. Do not drive after taking. May cause drowsiness Benzonatate 1 capsule Three times a day for cough    Follow up with rheumatology as scheduled   Labs today - CMET  Repeat spirometry/DLCO in 10 weeks    Follow up in 10 weeks after 30 minute PFT with Dr. Isaiah Serge or Katie Darlis Wragg,NP. Marland Kitchen If symptoms do not improve or worsen, please contact office for sooner follow up or seek emergency care.

## 2023-06-20 NOTE — Assessment & Plan Note (Addendum)
CT ILD with NSIP fibrosis. Felt as though COVID set off progression of disease. Started Esbriet November 2023. Tolerating well aside from increased GERD. Unable to tolerate Ofev. Maintained on CellCept and Plaquenil for CT disease. Stable on recent imaging. Decline in lung function, likely due to acute symptoms. She had improvement in diffusing capacity. Will repeat spiro/DLCO in 10 weeks.

## 2023-06-20 NOTE — Assessment & Plan Note (Addendum)
Significant improvement with carafate. Ok to decrease PPI dosing to once daily. If symptoms return/worsen, resume twice daily. GERD precautions advised.

## 2023-06-20 NOTE — Assessment & Plan Note (Signed)
Bronchitic symptoms exacerbated by GERD. Improvement with carafate therapy but still struggling with some residual reactive cough and wheeze. Will treat her with prednisone burst and cough control measures. Continue PRN SABA. Return precautions advised.   Patient Instructions  Continue Esbriet 3 pills Three times a day, which will be your maintenance dose.  -Take it with food.  -Make sure you have 5-6 hours between doses.  -Wear sunscreen when outside  Continue CellCept 1.5 g Twice daily  Continue plaquenil 200 mg Twice daily  Continue Albuterol inhaler 2 puffs every 6 hours as needed for shortness of breath or wheezing. Notify if symptoms persist despite rescue inhaler/neb use.  Continue pantoprazole (protonix) 1 tab daily - 30 min before breakfast. If you feel like you need to go back up to twice daily dosing, you can.  Continue carafate as directed by your primary care provider   Prednisone 40 mg daily for 5 days. Take in AM with food. Hycodan cough syrup 5 mL every 8 hours as needed for cough. Do not drive after taking. May cause drowsiness Benzonatate 1 capsule Three times a day for cough    Follow up with rheumatology as scheduled   Labs today - CMET  Repeat spirometry/DLCO in 10 weeks    Follow up in 10 weeks after 30 minute PFT with Dr. Isaiah Serge or Katie Darshan Solanki,NP. Marland Kitchen If symptoms do not improve or worsen, please contact office for sooner follow up or seek emergency care.

## 2023-06-30 ENCOUNTER — Telehealth: Payer: Self-pay | Admitting: Genetic Counselor

## 2023-06-30 NOTE — Telephone Encounter (Signed)
Called Chelsea Jennings to update her on amended genetic testing report.  At the time of her testing, two variants in BARD1 were detected--- one was pathogenic and one as a variant of uncertain significance (VUS).  Discussed that the VUS in BARD1 has been reclassified to likely benign, meaning it is not believed to be associated with an increased cancer risk.  She still has a pathogenic variant (mutation) in the BARD1 gene.  The reclassification of the BARD1 pathogenic variant has not changed and she should continue BARD1-related screening.  She expressed understanding.

## 2023-07-12 ENCOUNTER — Other Ambulatory Visit (INDEPENDENT_AMBULATORY_CARE_PROVIDER_SITE_OTHER): Payer: No Typology Code available for payment source

## 2023-07-12 DIAGNOSIS — J849 Interstitial pulmonary disease, unspecified: Secondary | ICD-10-CM

## 2023-07-12 DIAGNOSIS — Z5181 Encounter for therapeutic drug level monitoring: Secondary | ICD-10-CM

## 2023-07-13 LAB — COMPREHENSIVE METABOLIC PANEL
ALT: 11 U/L (ref 0–35)
AST: 17 U/L (ref 0–37)
Albumin: 4.1 g/dL (ref 3.5–5.2)
Alkaline Phosphatase: 60 U/L (ref 39–117)
BUN: 9 mg/dL (ref 6–23)
CO2: 29 meq/L (ref 19–32)
Calcium: 9.6 mg/dL (ref 8.4–10.5)
Chloride: 103 meq/L (ref 96–112)
Creatinine, Ser: 0.81 mg/dL (ref 0.40–1.20)
GFR: 83.66 mL/min (ref >60.00)
Glucose, Bld: 70 mg/dL (ref 70–99)
Potassium: 4 meq/L (ref 3.5–5.1)
Sodium: 138 meq/L (ref 135–145)
Total Bilirubin: 0.3 mg/dL (ref 0.2–1.2)
Total Protein: 7.7 g/dL (ref 6.0–8.3)

## 2023-08-02 ENCOUNTER — Other Ambulatory Visit: Payer: Self-pay | Admitting: Internal Medicine

## 2023-08-02 NOTE — Telephone Encounter (Signed)
Medication Refill - Medication: Fezolinetant (VEOZAH) 45 MG TABS [   Has the patient contacted their pharmacy? Yes.   (Agent: If no, request that the patient contact the pharmacy for the refill. If patient does not wish to contact the pharmacy document the reason why and proceed with request.) (Agent: If yes, when and what did the pharmacy advise?)  Preferred Pharmacy (with phone number or street name):  [phone] [place] - [address]  [address] [place] [address]  Phone: [phone] Fax: [phone]   Has the patient been seen for an appointment in the last year OR does the patient have an upcoming appointment? Yes.    Agent: Please be advised that RX refills may take up to 3 business days. We ask that you follow-up with your pharmacy.

## 2023-08-03 MED ORDER — VEOZAH 45 MG PO TABS: 45.0000 mg | ORAL_TABLET | Freq: Every day | ORAL | 3 refills | Status: DC

## 2023-08-03 NOTE — Telephone Encounter (Signed)
Requested medication (s) are due for refill today: yes  Requested medication (s) are on the active medication list: yes  Last refill:  06/03/23  Future visit scheduled: yes  Notes to clinic:   Medication not assigned to a protocol, review manually      Requested Prescriptions  Pending Prescriptions Disp Refills   Fezolinetant (VEOZAH) 45 MG TABS 30 tablet 1    Sig: Take 1 tablet (45 mg total) by mouth daily.     Off-Protocol Failed - 08/02/2023 11:27 AM      Failed - Medication not assigned to a protocol, review manually.      Passed - Valid encounter within last 12 months    Recent Outpatient Visits           2 months ago Establishing care with new doctor, encounter for   [facility] Marcine Matar, MD       Future Appointments             In 2 months Laural Benes, Binnie Rail, MD [facility]

## 2023-08-08 ENCOUNTER — Ambulatory Visit: Payer: No Typology Code available for payment source | Admitting: Internal Medicine

## 2023-08-11 ENCOUNTER — Other Ambulatory Visit: Payer: Self-pay | Admitting: Pulmonary Disease

## 2023-08-16 ENCOUNTER — Other Ambulatory Visit: Payer: Self-pay | Admitting: Pulmonary Disease

## 2023-08-19 ENCOUNTER — Other Ambulatory Visit: Payer: Self-pay | Admitting: Internal Medicine

## 2023-08-19 DIAGNOSIS — R053 Chronic cough: Secondary | ICD-10-CM

## 2023-08-19 DIAGNOSIS — J849 Interstitial pulmonary disease, unspecified: Secondary | ICD-10-CM

## 2023-08-21 NOTE — Telephone Encounter (Signed)
Requested Prescriptions  Pending Prescriptions Disp Refills   albuterol (VENTOLIN HFA) 108 (90 Base) MCG/ACT inhaler [Pharmacy Med Name: ALBUTEROL HFA (PROVENTIL) INH] 6.7 each 2    Sig: TAKE 2 PUFFS BY MOUTH EVERY 6 HOURS AS NEEDED FOR WHEEZE OR SHORTNESS OF BREATH     Pulmonology:  Beta Agonists 2 Passed - 08/19/2023  8:26 AM      Passed - Last BP in normal range    BP Readings from Last 1 Encounters:  06/20/23 120/70         Passed - Last Heart Rate in normal range    Pulse Readings from Last 1 Encounters:  06/20/23 73         Passed - Valid encounter within last 12 months    Recent Outpatient Visits           2 months ago Establishing care with new doctor, encounter for   [facility] Marcine Matar, MD       Future Appointments             In 1 month Laural Benes, Binnie Rail, MD [facility]

## 2023-09-01 ENCOUNTER — Other Ambulatory Visit: Payer: Self-pay | Admitting: Pulmonary Disease

## 2023-09-05 ENCOUNTER — Telehealth: Payer: Self-pay | Admitting: Pulmonary Disease

## 2023-09-05 NOTE — Telephone Encounter (Signed)
Patient states needs refill for Bactrim. [facility]acy is [facility] [place]. Patient phone number is [phone].

## 2023-09-08 ENCOUNTER — Other Ambulatory Visit: Payer: Self-pay | Admitting: Pulmonary Disease

## 2023-09-08 DIAGNOSIS — J849 Interstitial pulmonary disease, unspecified: Secondary | ICD-10-CM

## 2023-09-09 MED ORDER — SULFAMETHOXAZOLE-TRIMETHOPRIM 800-160 MG PO TABS: 1.0000 | ORAL_TABLET | ORAL | 1 refills | Status: DC

## 2023-09-09 NOTE — Telephone Encounter (Signed)
Rx sent to pharmacy for pt and mychart message sent to pt making her aware that this was done.

## 2023-09-12 ENCOUNTER — Encounter: Payer: Self-pay | Admitting: Nurse Practitioner

## 2023-09-12 ENCOUNTER — Ambulatory Visit (INDEPENDENT_AMBULATORY_CARE_PROVIDER_SITE_OTHER): Payer: No Typology Code available for payment source | Admitting: Pulmonary Disease

## 2023-09-12 ENCOUNTER — Ambulatory Visit (INDEPENDENT_AMBULATORY_CARE_PROVIDER_SITE_OTHER): Payer: No Typology Code available for payment source | Admitting: Nurse Practitioner

## 2023-09-12 VITALS — BP 130/84 | HR 78 | Temp 98.1°F | Ht 67.0 in | Wt 216.0 lb

## 2023-09-12 DIAGNOSIS — M351 Other overlap syndromes: Secondary | ICD-10-CM | POA: Insufficient documentation

## 2023-09-12 DIAGNOSIS — J849 Interstitial pulmonary disease, unspecified: Secondary | ICD-10-CM

## 2023-09-12 DIAGNOSIS — Z5181 Encounter for therapeutic drug level monitoring: Secondary | ICD-10-CM | POA: Diagnosis not present

## 2023-09-12 DIAGNOSIS — K219 Gastro-esophageal reflux disease without esophagitis: Secondary | ICD-10-CM | POA: Diagnosis not present

## 2023-09-12 NOTE — Progress Notes (Signed)
@Patient  ID: Chelsea Jennings, female    DOB: May 16, 1971, 52 y.o.   MRN: [id number]  Chief Complaint  Patient presents with   Follow-up    Review PFT today.  Cough persistent.  Benzonatate helps with cough.    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  She is a patient Dr. Shirlee More last seen in office 06/20/2023 by Allison Quarry NP.  Past medical history significant for hypertension, GERD.  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  She was put on prednisone 40 mg and then CellCept which was titrated up to 1 g daily.  She was able to be weaned off prednisone in December 2021 with improvement in symptoms of dyspnea.  She follows with Dr. Gevena Mart, rheumatology at the [facility] and is on Plaquenil.  She completed pulmonary rehab in 2021.  Pets: She is to have cats in the past.  No pets currently Occupation: Works for [employer].  Previously in the [employer] Exposures: Exposed to chemicals at [employer] camp in [place] Smoking history: Never smoker Travel history: No significant recent travel Relevant family history:No significant family history of lung disease  TEST/EVENTS:  11/10/2021 PFT: FVC 81, FEV1 81, ratio 86, TLC 68, DLCO 48.  No significant BD 05/11/2022 HRCT chest: Mild progression of ILD, again unusual in appearance but given the spectrum of findings and the mild progression, highly suspicious for UIP. 05/05/2023 HRCT chest: atherosclerosis. Extensive ILD without evidence of progression. There is lower lobe predominance. Liver mass, stable. 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  05/31/2023 PFT: FVC 65, FEV1 67, ratio 84, TLC 72, DLCOcor 57 09/12/2023 PFT: FVC 68, FEV1 65, ratio 75, DLCO 55  05/17/2022: OV with Dr. Isaiah Serge.  Recently finished prednisone taper and feeling well with no issues.  Here for review of  CT scan which showed mild progression of ILD with suggestion of honeycombing.  Decided to move forward with antifibrotic therapy with Ofev.  May be a good transplant candidate as well.  08/12/2022: Ov with Tanajah Boulter NP for follow up and to discuss Ofev side effects. She had started on Ofev in July; she started with one dose daily and had significant nausea with a few episodes of vomiting. She tried taking it with different food options without any change. She stopped it after 5 days and felt better immediately. Today, she reports that her breathing has been stable. She can complete activities of daily living without difficulty. No increased cough or chest congestion. She's taking CellCept 1.5 g Twice daily and still on plaquenil. She's feeling relatively well overall. Shared decision to stop Ofev and start on Esbriet. Labs stable.  10/26/2022: Ov with Landrie Beale NP for follow up. She started on Esbriet the first week of November 2023. She has been tolerating this very well. Her only major side effect has been increased reflux. She switch from omeprazole to nexium, which initially helped but now she feels like her indigestion is back. She denies any changes in appetite, nausea, vomiting, difficulties swallowing, hoarse voice. Her breathing is stable. No worsening cough or chest congestion.   06/20/2023: OV with Lashay Osborne NP for follow up after CT chest and PFT. CT chest was stable. PFTs showed a decline in lung function by about 10% but DLCO was improved from 48% to 57%. She tells me that she was having a lot of trouble with her reflux and  cough during the time of the test. She felt like her chest was tight and she was wheezing. She felt more short winded. She was almost vomited the reflux was so severe. She went to her PCP who prescribed carafate, which she started on 7/30, and she feels significantly better. Her reflux symptoms have resolved. Her cough has also improved; although, not entirely gone. Still dry and reactive.  Occasional wheeze but overall better. Feels like her breathing is back to her baseline. She denies any fevers, chills, hemoptysis, night sweats, leg swelling, orthopnea. She has not had to use the rescue inhaler prescribed by her PCP over the last week. She is still taking Esbriet and tolerating well aside from her reflux.   09/12/2023: Today - follow up Discussed the use of AI scribe software for clinical note transcription with the patient, who gave verbal consent to proceed.  History of Present Illness   The patient, with a history of connective tissue disease and ILD, presents for follow-up. She had COVID in March 2023 with evidence of ILD progression on imaging afterwards. At our last visit, she had a decline in lung function noted on PFTs. She was also having some acute symptoms and difficulties with GERD so we planned to repeat once symptoms were better managed. She is feeling better today. Still has some occasional breakthrough GERD but no longer having any issues of vomiting like she has in the past month. She will take a Tums or carafate when this happens and symptoms resolve. Cough is stable; dry. Breathing is stable. Her lung function today is stable from July but declined when compared to January 2023.  She was unable to tolerate Ofev and started on Esbriet in October 2023.   She is currently on Plaquenil and Cellcept, and Bactrim for PJP prophylaxis due to immunosuppression. The patient's Bactrim prescription was recently refilled with a 30-day prescription, but she expresses insurance preference for a 90-day prescription. Previous high dose steroids weaned off in June 2023.       Allergies  Allergen Reactions   Nintedanib Diarrhea and Nausea Only    Dizziness, fatigue   Prilosec [Omeprazole] Other (See Comments)    Alopecia   Famotidine Anxiety and Palpitations   Premarin [Estrogens Conjugated] Rash    Immunization History  Administered Date(s) Administered    Influenza,inj,Quad PF,6+ Mos 08/28/2017, 08/03/2018, 08/09/2019, 09/11/2020, 09/02/2021, 08/12/2022   Moderna Covid-19 Fall Seasonal Vaccine 68yrs & older 02/10/2023   Moderna Sars-Covid-2 Vaccination 12/12/2019, 01/09/2020, 08/07/2020, 04/13/2021   Pneumococcal Conjugate-13 01/13/2015, 04/23/2020   Pneumococcal Polysaccharide-23 05/07/2013, 08/29/2013    Past Medical History:  Diagnosis Date   BARD1 gene mutation positive 12/29/2021   Family history of breast cancer 12/06/2021   GERD (gastroesophageal reflux disease)    Glaucoma    Hypertension    Interstitial lung disease (HCC)     Tobacco History: Social History   Tobacco Use  Smoking Status Never  Smokeless Tobacco Never   Counseling given: Not Answered   Outpatient Medications Prior to Visit  Medication Sig Dispense Refill   albuterol (VENTOLIN HFA) 108 (90 Base) MCG/ACT inhaler TAKE 2 PUFFS BY MOUTH EVERY 6 HOURS AS NEEDED FOR WHEEZE OR SHORTNESS OF BREATH 6.7 each 2   amLODipine (NORVASC) 5 MG tablet Take 5 mg by mouth daily.     benzonatate (TESSALON) 200 MG capsule Take 1 capsule (200 mg total) by mouth 3 (three) times daily as needed for cough. 30 capsule 1   clobetasol ointment (TEMOVATE) 0.05 % Apply  1 application topically 2 (two) times daily.     dorzolamide-timolol (COSOPT) 22.3-6.8 MG/ML ophthalmic solution 1 drop 2 (two) times daily.     Fezolinetant (VEOZAH) 45 MG TABS Take 1 tablet (45 mg total) by mouth daily. 30 tablet 3   HYDROcodone bit-homatropine (HYCODAN) 5-1.5 MG/5ML syrup Take 5 mLs by mouth every 8 (eight) hours as needed for cough. 120 mL 0   hydroxychloroquine (PLAQUENIL) 200 MG tablet Take 200 mg by mouth 2 (two) times daily.     latanoprost (XALATAN) 0.005 % ophthalmic solution Place 1 drop into both eyes at bedtime.     levocetirizine (XYZAL) 5 MG tablet TAKE 1 TABLET BY MOUTH EVERY DAY IN THE EVENING 90 tablet 1   mycophenolate (CELLCEPT) 500 MG tablet TAKE 3 TABLETS BY MOUTH TWICE DAILY. 180  tablet 5   pantoprazole (PROTONIX) 40 MG tablet Take 1 tablet (40 mg total) by mouth 2 (two) times daily. 60 tablet 5   Pirfenidone (ESBRIET) 267 MG TABS Month 2 and onwards: take 3 tablets by mouth three times daily with meals 270 tablet 4   simethicone (MYLICON) 80 MG chewable tablet Chew 80 mg by mouth.     sucralfate (CARAFATE) 1 g tablet 1 tab PO three times daily with meals PRN 60 tablet 1   sulfamethoxazole-trimethoprim (BACTRIM DS) 800-160 MG tablet Take 1 tablet by mouth 3 (three) times a week. 39 tablet 1   No facility-administered medications prior to visit.     Review of Systems:   Constitutional: No weight loss or gain, night sweats, fevers, chills, or lassitude. +fatigue (baseline) HEENT: No headaches, difficulty swallowing, tooth/dental problems, or sore throat. No sneezing, itching, ear ache, nasal congestion, or post nasal drip CV:  No chest pain, orthopnea, PND, swelling in lower extremities, anasarca, dizziness, palpitations, syncope Resp: +shortness of breath with exertion (baseline); dry cough. No wheeze. No excess mucus or change in color of mucus. No productive or non-productive. No hemoptysis. No chest wall deformity GI:  +heartburn/indigestion (improved). No abdominal pain, nausea, vomiting, diarrhea, change in bowel habits, loss of appetite, bloody stools.  MSK:  No joint pain or swelling.   Neuro: No dizziness or lightheadedness.  Psych: No depression or anxiety. Mood stable.     Physical Exam:  BP 130/84 (BP Location: Right Arm, Patient Position: Sitting, Cuff Size: Large)   Pulse 78   Temp 98.1 F (36.7 C) (Oral)   Ht 5\' 7"  (1.702 m)   Wt 216 lb (98 kg)   SpO2 100%   BMI 33.83 kg/m   GEN: Pleasant, interactive, well-appearing; obese; in no acute distress. HEENT:  Normocephalic and atraumatic. PERRLA. Sclera white. Nasal turbinates pink, moist and patent bilaterally. No rhinorrhea present. Oropharynx pink and moist, without exudate or edema. No  lesions, ulcerations, or postnasal drip.  NECK:  Supple w/ fair ROM. No LAD.   CV: RRR, no m/r/g, no peripheral edema. Pulses intact, +2 bilaterally. No cyanosis, pallor or clubbing. PULMONARY:  Unlabored, regular breathing. Mild bibasilar crackles otherwise clear bilaterally A&P w/o wheezes/rales/rhonchi. No accessory muscle use.  GI: BS present and normoactive. Soft, non-tender to palpation. No organomegaly or masses detected.  MSK: No erythema, warmth or tenderness. Cap refil <2 sec all extrem. No deformities or joint swelling noted.  Neuro: A/Ox3. No focal deficits noted.   Skin: Warm, no lesions or rashe Psych: Normal affect and behavior. Judgement and thought content appropriate.     Lab Results:  CBC    Component Value Date/Time  WBC 3.7 (L) 12/02/2022 1333   RBC 4.16 12/02/2022 1333   HGB 12.2 12/02/2022 1333   HCT 37.8 12/02/2022 1333   PLT 330.0 12/02/2022 1333   MCV 90.9 12/02/2022 1333   MCHC 32.3 12/02/2022 1333   RDW 13.2 12/02/2022 1333   LYMPHSABS 0.9 12/02/2022 1333   MONOABS 0.3 12/02/2022 1333   EOSABS 0.0 12/02/2022 1333   BASOSABS 0.0 12/02/2022 1333    BMET    Component Value Date/Time   NA 138 07/12/2023 1554   K 4.0 07/12/2023 1554   CL 103 07/12/2023 1554   CO2 29 07/12/2023 1554   GLUCOSE 70 07/12/2023 1554   BUN 9 07/12/2023 1554   CREATININE 0.81 07/12/2023 1554   CALCIUM 9.6 07/12/2023 1554    BNP No results found for: "BNP"   Imaging:  No results found.  Administration History     None          Latest Ref Rng & Units 05/31/2023    1:31 PM 11/10/2021   10:01 AM  PFT Results  FVC-Pre L  2.63   FVC-Predicted Pre % 65  81   FVC-Post L 2.57  2.55   FVC-Predicted Post % 66  78   Pre FEV1/FVC % % 81  81   Post FEV1/FCV % % 84  86   FEV1-Pre L 2.06  2.12   FEV1-Predicted Pre % 67  81   FEV1-Post L 2.16  2.20   DLCO uncorrected ml/min/mmHg 13.39  11.22   DLCO UNC% % 57  48   DLCO corrected ml/min/mmHg 13.39  11.22   DLCO  COR %Predicted % 57  48   DLVA Predicted % 89  75   TLC L 3.99  3.75   TLC % Predicted % 72  68   RV % Predicted % 60  52     No results found for: "NITRICOXIDE"      Assessment & Plan:   ILD (interstitial lung disease) (HCC) CT ILD with NSIP fibrosis. Felt as though COVID set off progression of disease. Started Esbriet November 2023. Tolerating well aside from increased GERD. Unable to tolerate Ofev. Maintained on CellCept and Plaquenil for CT disease. She's had evidence of disease progression following COVID March 2023. Overall, PFTs and symptoms are stable compared to July 2024. Will continue to monitor for disease progression. Plan for repeat HRCT January 2024 for 6 month follow up (pt prefers to wait until after the holidays). Repeat spiro/DLCO at follow up. May be a good lung transplant patient depending on clinical course. Labs next month for medication monitoring.   Patient Instructions  Your lung function does show a decline compared with January 2023. You had COVID in March 2023, which we suspect is the driving factor for this. Good news is that it looks stable from July. We will continue to monitor for evidence of disease progression.   Continue Esbriet 3 pills Three times a day, which will be your maintenance dose.  -Take it with food.  -Make sure you have 5-6 hours between doses.  -Wear sunscreen when outside  Continue CellCept 1.5 g Twice daily  Continue plaquenil 200 mg Twice daily  Continue Bactrim M/W/F Continue Albuterol inhaler 2 puffs every 6 hours as needed for shortness of breath or wheezing. Notify if symptoms persist despite rescue inhaler/neb use.  Continue pantoprazole (protonix) 1 tab daily - 30 min before breakfast.  Continue carafate as directed by your primary care provider  Continue benzonatate 1 capsule Three times  a day for cough    Follow up with rheumatology as scheduled   Come back for labs week of 12/4. Call before you come    Repeat  spirometry/DLCO end of January/beginning of February HRCT chest end of January    Follow up with Dr. Isaiah Serge beginning of February after PFT and HRCT. If symptoms do not improve or worsen, please contact office for sooner follow up or seek emergency care.    GERD (gastroesophageal reflux disease) Improved control. Continue management with PCP/GI.  Mixed connective tissue disease (HCC) See above. Follow up with rheum as scheduled     I spent 35 minutes of dedicated to the care of this patient on the date of this encounter to include pre-visit review of records, face-to-face time with the patient discussing conditions above, post visit ordering of testing, clinical documentation with the electronic health record, making appropriate referrals as documented, and communicating necessary findings to members of the patients care team.  Noemi Chapel, NP 09/12/2023  Pt aware and understands NP's role.

## 2023-09-12 NOTE — Assessment & Plan Note (Signed)
See above. Follow up with rheum as scheduled

## 2023-09-12 NOTE — Patient Instructions (Addendum)
Your lung function does show a decline compared with January 2023. You had COVID in March 2023, which we suspect is the driving factor for this. Good news is that it looks stable from July. We will continue to monitor for evidence of disease progression.   Continue Esbriet 3 pills Three times a day, which will be your maintenance dose.  -Take it with food.  -Make sure you have 5-6 hours between doses.  -Wear sunscreen when outside  Continue CellCept 1.5 g Twice daily  Continue plaquenil 200 mg Twice daily  Continue Bactrim M/W/F Continue Albuterol inhaler 2 puffs every 6 hours as needed for shortness of breath or wheezing. Notify if symptoms persist despite rescue inhaler/neb use.  Continue pantoprazole (protonix) 1 tab daily - 30 min before breakfast.  Continue carafate as directed by your primary care provider  Continue benzonatate 1 capsule Three times a day for cough    Follow up with rheumatology as scheduled   Come back for labs week of 12/4. Call before you come    Repeat spirometry/DLCO end of January/beginning of February HRCT chest end of January    Follow up with Dr. Isaiah Serge beginning of February after PFT and HRCT. If symptoms do not improve or worsen, please contact office for sooner follow up or seek emergency care.

## 2023-09-12 NOTE — Patient Instructions (Signed)
Spirometry/DLCO performed today. 

## 2023-09-12 NOTE — Progress Notes (Deleted)
@Patient  ID: [name], female    DOB: [birth year], 52 y.o.   MRN: [id number]  Chief Complaint  Patient presents with   Follow-up    Review PFT today.  Cough persistent.  Benzonatate helps with cough.    Referring provider: Marcine Matar, MD  HPI:   TEST/EVENTS:   Allergies  Allergen Reactions   Nintedanib Diarrhea and Nausea Only    Dizziness, fatigue   Prilosec [Omeprazole] Other (See Comments)    Alopecia   Famotidine Anxiety and Palpitations   Premarin [Estrogens Conjugated] Rash    Immunization History  Administered Date(s) Administered   Influenza,inj,Quad PF,6+ Mos 08/28/2017, 08/03/2018, 08/09/2019, 09/11/2020, 09/02/2021, 08/12/2022   Moderna Covid-19 Fall Seasonal Vaccine 50yrs & older 02/10/2023   Moderna Sars-Covid-2 Vaccination 12/12/2019, 01/09/2020, 08/07/2020, 04/13/2021   Pneumococcal Conjugate-13 01/13/2015, 04/23/2020   Pneumococcal Polysaccharide-23 05/07/2013, 08/29/2013    Past Medical History:  Diagnosis Date   BARD1 gene mutation positive 12/29/2021   Family history of breast cancer 12/06/2021   GERD (gastroesophageal reflux disease)    Glaucoma    Hypertension    Interstitial lung disease (HCC)     Tobacco History: Social History   Tobacco Use  Smoking Status Never  Smokeless Tobacco Never   Counseling given: Not Answered   Outpatient Medications Prior to Visit  Medication Sig Dispense Refill   albuterol (VENTOLIN HFA) 108 (90 Base) MCG/ACT inhaler TAKE 2 PUFFS BY MOUTH EVERY 6 HOURS AS NEEDED FOR WHEEZE OR SHORTNESS OF BREATH 6.7 each 2   amLODipine (NORVASC) 5 MG tablet Take 5 mg by mouth daily.     benzonatate (TESSALON) 200 MG capsule Take 1 capsule (200 mg total) by mouth 3 (three) times daily as needed for cough. 30 capsule 1   clobetasol ointment (TEMOVATE) 0.05 % Apply 1 application topically 2 (two) times daily.     dorzolamide-timolol (COSOPT) 22.3-6.8 MG/ML ophthalmic solution 1 drop 2 (two) times daily.      Fezolinetant (VEOZAH) 45 MG TABS Take 1 tablet (45 mg total) by mouth daily. 30 tablet 3   HYDROcodone bit-homatropine (HYCODAN) 5-1.5 MG/5ML syrup Take 5 mLs by mouth every 8 (eight) hours as needed for cough. 120 mL 0   hydroxychloroquine (PLAQUENIL) 200 MG tablet Take 200 mg by mouth 2 (two) times daily.     latanoprost (XALATAN) 0.005 % ophthalmic solution Place 1 drop into both eyes at bedtime.     levocetirizine (XYZAL) 5 MG tablet TAKE 1 TABLET BY MOUTH EVERY DAY IN THE EVENING 90 tablet 1   mycophenolate (CELLCEPT) 500 MG tablet TAKE 3 TABLETS BY MOUTH TWICE DAILY. 180 tablet 5   pantoprazole (PROTONIX) 40 MG tablet Take 1 tablet (40 mg total) by mouth 2 (two) times daily. 60 tablet 5   Pirfenidone (ESBRIET) 267 MG TABS Month 2 and onwards: take 3 tablets by mouth three times daily with meals 270 tablet 4   simethicone (MYLICON) 80 MG chewable tablet Chew 80 mg by mouth.     sucralfate (CARAFATE) 1 g tablet 1 tab PO three times daily with meals PRN 60 tablet 1   sulfamethoxazole-trimethoprim (BACTRIM DS) 800-160 MG tablet Take 1 tablet by mouth 3 (three) times a week. 39 tablet 1   No facility-administered medications prior to visit.     Review of Systems:   Constitutional: No weight loss or gain, night sweats, fevers, chills, fatigue, or lassitude. HEENT: No headaches, difficulty swallowing, tooth/dental problems, or sore throat. No sneezing, itching, ear ache, nasal congestion, or  post nasal drip CV:  No chest pain, orthopnea, PND, swelling in lower extremities, anasarca, dizziness, palpitations, syncope Resp: No shortness of breath with exertion or at rest. No excess mucus or change in color of mucus. No productive or non-productive. No hemoptysis. No wheezing.  No chest wall deformity GI:  No heartburn, indigestion, abdominal pain, nausea, vomiting, diarrhea, change in bowel habits, loss of appetite, bloody stools.  GU: No dysuria, change in color of urine, urgency or frequency.   No flank pain, no hematuria  Skin: No rash, lesions, ulcerations MSK:  No joint pain or swelling.  No decreased range of motion.  No back pain. Neuro: No dizziness or lightheadedness.  Psych: No depression or anxiety. Mood stable.     Physical Exam:  BP 130/84 (BP Location: Right Arm, Patient Position: Sitting, Cuff Size: Large)   Pulse 78   Temp 98.1 F (36.7 C) (Oral)   Ht 5\' 7"  (1.702 m)   Wt 216 lb (98 kg)   SpO2 100%   BMI 33.83 kg/m   GEN: Pleasant, interactive, well-nourished/chronically-ill appearing/acutely-ill appearing/poorly-nourished/morbidly obese; in no acute distress.****** HEENT:  Normocephalic and atraumatic. EACs patent bilaterally. TM pearly gray with present light reflex bilaterally. PERRLA. Sclera white. Nasal turbinates pink, moist and patent bilaterally. No rhinorrhea present. Oropharynx pink and moist, without exudate or edema. No lesions, ulcerations, or postnasal drip.  NECK:  Supple w/ fair ROM. No JVD present. Normal carotid impulses w/o bruits. Thyroid symmetrical with no goiter or nodules palpated. No lymphadenopathy.   CV: RRR, no m/r/g, no peripheral edema. Pulses intact, +2 bilaterally. No cyanosis, pallor or clubbing. PULMONARY:  Unlabored, regular breathing. Clear bilaterally A&P w/o wheezes/rales/rhonchi. No accessory muscle use.  GI: BS present and normoactive. Soft, non-tender to palpation. No organomegaly or masses detected. No CVA tenderness. MSK: No erythema, warmth or tenderness. Cap refil <2 sec all extrem. No deformities or joint swelling noted.  Neuro: A/Ox3. No focal deficits noted.   Skin: Warm, no lesions or rashe Psych: Normal affect and behavior. Judgement and thought content appropriate.     Lab Results:  CBC    Component Value Date/Time   WBC 3.7 (L) 12/02/2022 1333   RBC 4.16 12/02/2022 1333   HGB 12.2 12/02/2022 1333   HCT 37.8 12/02/2022 1333   PLT 330.0 12/02/2022 1333   MCV 90.9 12/02/2022 1333   MCHC 32.3  12/02/2022 1333   RDW 13.2 12/02/2022 1333   LYMPHSABS 0.9 12/02/2022 1333   MONOABS 0.3 12/02/2022 1333   EOSABS 0.0 12/02/2022 1333   BASOSABS 0.0 12/02/2022 1333    BMET    Component Value Date/Time   NA 138 07/12/2023 1554   K 4.0 07/12/2023 1554   CL 103 07/12/2023 1554   CO2 29 07/12/2023 1554   GLUCOSE 70 07/12/2023 1554   BUN 9 07/12/2023 1554   CREATININE 0.81 07/12/2023 1554   CALCIUM 9.6 07/12/2023 1554    BNP No results found for: "BNP"   Imaging:  No results found.  Administration History     None          Latest Ref Rng & Units 05/31/2023    1:31 PM 11/10/2021   10:01 AM  PFT Results  FVC-Pre L  2.63   FVC-Predicted Pre % 65  81   FVC-Post L 2.57  2.55   FVC-Predicted Post % 66  78   Pre FEV1/FVC % % 81  81   Post FEV1/FCV % % 84  86   FEV1-Pre L  2.06  2.12   FEV1-Predicted Pre % 67  81   FEV1-Post L 2.16  2.20   DLCO uncorrected ml/min/mmHg 13.39  11.22   DLCO UNC% % 57  48   DLCO corrected ml/min/mmHg 13.39  11.22   DLCO COR %Predicted % 57  48   DLVA Predicted % 89  75   TLC L 3.99  3.75   TLC % Predicted % 72  68   RV % Predicted % 60  52     No results found for: "NITRICOXIDE"      Assessment & Plan:   No problem-specific Assessment & Plan notes found for this encounter.   Advised if symptoms do not improve or worsen, to please contact office for sooner follow up or seek emergency care.   I spent *** minutes of dedicated to the care of this patient on the date of this encounter to include pre-visit review of records, face-to-face time with the patient discussing conditions above, post visit ordering of testing, clinical documentation with the electronic health record, making appropriate referrals as documented, and communicating necessary findings to members of the patients care team.  Noemi Chapel, NP 09/12/2023  Pt aware and understands NP's role.

## 2023-09-12 NOTE — Assessment & Plan Note (Addendum)
CT ILD with NSIP fibrosis. Felt as though COVID set off progression of disease. Started Esbriet November 2023. Tolerating well aside from increased GERD. Unable to tolerate Ofev. Maintained on CellCept and Plaquenil for CT disease. She's had evidence of disease progression following COVID March 2023. Overall, PFTs and symptoms are stable compared to July 2024. Will continue to monitor for disease progression. Plan for repeat HRCT January 2024 for 6 month follow up (pt prefers to wait until after the holidays). Repeat spiro/DLCO at follow up. May be a good lung transplant patient depending on clinical course. Labs next month for medication monitoring.   Patient Instructions  Your lung function does show a decline compared with January 2023. You had COVID in March 2023, which we suspect is the driving factor for this. Good news is that it looks stable from July. We will continue to monitor for evidence of disease progression.   Continue Esbriet 3 pills Three times a day, which will be your maintenance dose.  -Take it with food.  -Make sure you have 5-6 hours between doses.  -Wear sunscreen when outside  Continue CellCept 1.5 g Twice daily  Continue plaquenil 200 mg Twice daily  Continue Bactrim M/W/F Continue Albuterol inhaler 2 puffs every 6 hours as needed for shortness of breath or wheezing. Notify if symptoms persist despite rescue inhaler/neb use.  Continue pantoprazole (protonix) 1 tab daily - 30 min before breakfast.  Continue carafate as directed by your primary care provider  Continue benzonatate 1 capsule Three times a day for cough    Follow up with rheumatology as scheduled   Come back for labs week of 12/4. Call before you come    Repeat spirometry/DLCO end of January/beginning of February HRCT chest end of January    Follow up with Dr. Isaiah Serge beginning of February after PFT and HRCT. If symptoms do not improve or worsen, please contact office for sooner follow up or seek emergency  care.

## 2023-09-12 NOTE — Assessment & Plan Note (Signed)
Improved control. Continue management with PCP/GI.

## 2023-09-12 NOTE — Progress Notes (Signed)
Spirometry/DLCO performed today. 

## 2023-09-15 ENCOUNTER — Ambulatory Visit: Payer: 59 | Admitting: Nurse Practitioner

## 2023-09-24 LAB — PULMONARY FUNCTION TEST
DL/VA % pred: 87 %
DL/VA: 3.66 ml/min/mmHg/L
DLCO cor % pred: 55 %
DLCO cor: 12.72 ml/min/mmHg
DLCO unc % pred: 55 %
DLCO unc: 12.72 ml/min/mmHg
FEF 25-75 Pre: 1.53 L/s
FEF2575-%Pred-Pre: 53 %
FEV1-%Pred-Pre: 65 %
FEV1-Pre: 2.01 L
FEV1FVC-%Pred-Pre: 94 %
FEV6-%Pred-Pre: 70 %
FEV6-Pre: 2.66 L
FEV6FVC-%Pred-Pre: 102 %
FVC-%Pred-Pre: 68 %
FVC-Pre: 2.66 L
Pre FEV1/FVC ratio: 75 %
Pre FEV6/FVC Ratio: 100 %

## 2023-10-10 ENCOUNTER — Ambulatory Visit: Payer: 59 | Attending: Internal Medicine | Admitting: Internal Medicine

## 2023-10-10 ENCOUNTER — Encounter: Payer: Self-pay | Admitting: Internal Medicine

## 2023-10-10 VITALS — BP 122/80 | HR 68 | Temp 98.2°F | Ht 67.0 in | Wt 221.0 lb

## 2023-10-10 DIAGNOSIS — R232 Flushing: Secondary | ICD-10-CM | POA: Diagnosis not present

## 2023-10-10 DIAGNOSIS — J849 Interstitial pulmonary disease, unspecified: Secondary | ICD-10-CM | POA: Diagnosis not present

## 2023-10-10 DIAGNOSIS — I10 Essential (primary) hypertension: Secondary | ICD-10-CM | POA: Diagnosis not present

## 2023-10-10 DIAGNOSIS — E66811 Obesity, class 1: Secondary | ICD-10-CM

## 2023-10-10 DIAGNOSIS — Z5181 Encounter for therapeutic drug level monitoring: Secondary | ICD-10-CM

## 2023-10-10 DIAGNOSIS — M359 Systemic involvement of connective tissue, unspecified: Secondary | ICD-10-CM | POA: Diagnosis not present

## 2023-10-10 DIAGNOSIS — Z6834 Body mass index (BMI) 34.0-34.9, adult: Secondary | ICD-10-CM

## 2023-10-10 DIAGNOSIS — Z903 Acquired absence of stomach [part of]: Secondary | ICD-10-CM

## 2023-10-10 DIAGNOSIS — K219 Gastro-esophageal reflux disease without esophagitis: Secondary | ICD-10-CM

## 2023-10-10 MED ORDER — SUCRALFATE 1 G PO TABS: ORAL_TABLET | ORAL | 4 refills | Status: DC

## 2023-10-10 MED ORDER — LEVOCETIRIZINE DIHYDROCHLORIDE 5 MG PO TABS: 5.0000 mg | ORAL_TABLET | Freq: Every evening | ORAL | 1 refills | Status: DC

## 2023-10-10 MED ORDER — VEOZAH 45 MG PO TABS
45.0000 mg | ORAL_TABLET | Freq: Every day | ORAL | 1 refills | Status: DC
Start: 2023-10-10 — End: 2023-10-16

## 2023-10-10 NOTE — Patient Instructions (Signed)
Please bring a copy of your last mammogram report from the [place] for our records.

## 2023-10-10 NOTE — Progress Notes (Signed)
Patient ID: Chelsea Jennings, female    DOB: 09/08/1971  MRN: [id number]  CC: Hypertension (HTN f/u. Med refill. Herold Harms a [employer] letter - would like for all dx to be together/Inintentional weight gain - discuss options /Already received flu vax)   Subjective: Chelsea Jennings is a 52 y.o. female who presents for chronic ds management. Her concerns today include:  Patient with history of HTN, GERD, Fhx breast CA ([facility]ed [facility] m[facility] Dr. 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  Followed by [facility]ology at [facility] and pulmonary Dr. Isaiah Serge), gastric sleeve   Discussed the use of AI scribe software for clinical note transcription with the patient, who gave verbal consent to proceed.  History of Present Illness   Hot flashes: She reports significant improvement in hot flashes since starting Veozah four months ago, with a decrease in frequency and severity, particularly during the day. However, she continues to experience some hot flashes at night, but they are not as severe as before.  The patient also expresses concern about weight gain, reporting a gain of about 22 pounds this year despite maintaining small portion sizes due to a previous gastric sleeve surgery.  She had surgery in 2016.  Did well and maintaining her weight to around 190 pounds until she was placed on prednisone 40 mg daily for 16 months in 2021.  She notes that the weight gain is primarily around the waist. She has been on prednisone three times this year each time for 1-2 wks, which she acknowledges could contribute to the weight gain. She also mentions that she has been exercising on an elliptical machine about three times a week for 15-20 minutes each time. Having had weight reduction surgery in the past, she takes a vitamin D supplement daily but does not take a multivitamin.  States that her previous PCP told her blood count has been  good.  Not on iron supplement.  In addition, the patient mentions that she has been approved for disability due to ILD, which she believes is connected to exposure to harmful chemicals during her [employer]. She is seeking a nexus letter to support her disability claim that her other illnesses may all be interconnected. -Continues to see her rheumatologist through the [facility] for connective tissue disease.  Still on Plaquenil.  Currently on CellCept and pirfenidone through Dr. Isaiah Serge for ILD.  HTN:  compliant with taking Norvasc 5 mg daily.     Request refill on Xyzal and Carafate.  Patient Active Problem List   Diagnosis Date Noted   Mixed connective tissue disease (HCC) 09/12/2023   Essential hypertension 06/02/2023   Chronic cough 06/02/2023   GERD (gastroesophageal reflux disease) 10/26/2022   BARD1 gene mutation positive 12/29/2021   Genetic testing 12/16/2021   Family history of breast cancer 12/06/2021   COVID-19 virus infection 11/30/2021   Acute bronchitis 11/30/2021   ILD (interstitial lung disease) (HCC) 11/30/2021     Current Outpatient Medications on File Prior to Visit  Medication Sig Dispense Refill   albuterol (VENTOLIN HFA) 108 (90 Base) MCG/ACT inhaler TAKE 2 PUFFS BY MOUTH EVERY 6 HOURS AS NEEDED FOR WHEEZE OR SHORTNESS OF BREATH 6.7 each 2   amLODipine (NORVASC) 5 MG tablet Take 5 mg by mouth daily.     benzonatate (TESSALON) 200 MG capsule Take 1 capsule (200 mg total) by mouth 3 (three) times daily as needed for cough. 30 capsule 1  clobetasol ointment (TEMOVATE) 0.05 % Apply 1 application topically 2 (two) times daily.     dorzolamide-timolol (COSOPT) 22.3-6.8 MG/ML ophthalmic solution 1 drop 2 (two) times daily.     HYDROcodone bit-homatropine (HYCODAN) 5-1.5 MG/5ML syrup Take 5 mLs by mouth every 8 (eight) hours as needed for cough. 120 mL 0   hydroxychloroquine (PLAQUENIL) 200 MG tablet Take 200 mg by mouth 2 (two) times daily.     latanoprost (XALATAN)  0.005 % ophthalmic solution Place 1 drop into both eyes at bedtime.     mycophenolate (CELLCEPT) 500 MG tablet TAKE 3 TABLETS BY MOUTH TWICE DAILY. 180 tablet 5   pantoprazole (PROTONIX) 40 MG tablet Take 1 tablet (40 mg total) by mouth 2 (two) times daily. 60 tablet 5   Pirfenidone (ESBRIET) 267 MG TABS Month 2 and onwards: take 3 tablets by mouth three times daily with meals 270 tablet 4   simethicone (MYLICON) 80 MG chewable tablet Chew 80 mg by mouth.     sulfamethoxazole-trimethoprim (BACTRIM DS) 800-160 MG tablet Take 1 tablet by mouth 3 (three) times a week. 39 tablet 1   No current facility-administered medications on file prior to visit.    Allergies  Allergen Reactions   Nintedanib Diarrhea and Nausea Only    Dizziness, fatigue   Prilosec [Omeprazole] Other (See Comments)    Alopecia   Famotidine Anxiety and Palpitations   Premarin [Estrogens Conjugated] Rash    Social History   Socioeconomic History   Marital status: Single    Spouse name: Not on file   Number of children: 0   Years of education: Not on file   Highest education level: Associate degree: academic program  Occupational History   Occupation: Retail banker: [employer]  Tobacco Use   Smoking status: Never   Smokeless tobacco: Never  Vaping Use   Vaping status: Never Used  Substance and Sexual Activity   Alcohol use: Not Currently   Drug use: Never   Sexual activity: Not on file  Other Topics Concern   Not on file  Social History Narrative   Not on file   Social Determinants of Health   Financial Resource Strain: Not on file  Food Insecurity: Not on file  Transportation Needs: Not on file  Physical Activity: Not on file  Stress: Not on file  Social Connections: Not on file  Intimate Partner Violence: Not on file    Family History  Problem Relation Age of Onset   Breast cancer Mother        dx 65s; dx 8 (bilateral w/ mets)   Throat cancer Maternal Uncle    Breast cancer  Paternal Aunt        dx 79s   Breast cancer Paternal Grandmother        dx before 71   Brain cancer Other        MGM's brothers x2   Lung cancer Other        MGM's brother    Past Surgical History:  Procedure Laterality Date   ABDOMINAL HYSTERECTOMY     LAPAROSCOPIC GASTRIC BAND REMOVAL WITH LAPAROSCOPIC GASTRIC SLEEVE RESECTION  04/2015    ROS: Review of Systems Negative except as stated above  PHYSICAL EXAM: BP 122/80 (BP Location: Left Arm, Patient Position: Sitting, Cuff Size: Normal)   Pulse 68   Temp 98.2 F (36.8 C) (Oral)   Ht 5\' 7"  (1.702 m)   Wt 221 lb (100.2 kg)   SpO2 100%  BMI 34.61 kg/m   Wt Readings from Last 3 Encounters:  10/10/23 221 lb (100.2 kg)  09/12/23 216 lb (98 kg)  06/20/23 217 lb 3.2 oz (98.5 kg)    Physical Exam  General appearance - alert, well appearing, middle-age African-American female and in no distress Mental status - normal mood, behavior, speech, dress, motor activity, and thought processes Eyes - pupils equal and reactive, extraocular eye movements intact Neck - supple, no significant adenopathy Chest - clear to auscultation, no wheezes, rales or rhonchi, symmetric air entry Heart - normal rate, regular rhythm, normal S1, S2, no murmurs, rubs, clicks or gallops      Latest Ref Rng & Units 07/12/2023    3:54 PM 06/02/2023   10:14 AM 02/15/2023    4:28 PM  CMP  Glucose 70 - 99 mg/dL 70   74   BUN 6 - 23 mg/dL 9   10   Creatinine 3.66 - 1.20 mg/dL 4.40   3.47   Sodium 425 - 145 mEq/L 138   138   Potassium 3.5 - 5.1 mEq/L 4.0   3.6   Chloride 96 - 112 mEq/L 103   103   CO2 19 - 32 mEq/L 29   27   Calcium 8.4 - 10.5 mg/dL 9.6   9.8   Total Protein 6.0 - 8.3 g/dL 7.7  7.4  7.8   Total Bilirubin 0.2 - 1.2 mg/dL 0.3  0.4  0.3   Alkaline Phos 39 - 117 U/L 60  67  64   AST 0 - 37 U/L 17  10  14    ALT 0 - 35 U/L 11  9  13     Lipid Panel  No results found for: "CHOL", "TRIG", "HDL", "CHOLHDL", "VLDL", "LDLCALC",  "LDLDIRECT"  CBC    Component Value Date/Time   WBC 3.7 (L) 12/02/2022 1333   RBC 4.16 12/02/2022 1333   HGB 12.2 12/02/2022 1333   HCT 37.8 12/02/2022 1333   PLT 330.0 12/02/2022 1333   MCV 90.9 12/02/2022 1333   MCHC 32.3 12/02/2022 1333   RDW 13.2 12/02/2022 1333   LYMPHSABS 0.9 12/02/2022 1333   MONOABS 0.3 12/02/2022 1333   EOSABS 0.0 12/02/2022 1333   BASOSABS 0.0 12/02/2022 1333    ASSESSMENT AND PLAN: 1. Essential hypertension At goal.  Continue Norvasc 5 mg daily.  2. Class 1 obesity with serious comorbidity and body mass index (BMI) of 34.0 to 34.9 in adult, unspecified obesity type Commended her on eating smaller portions.  Advised to eliminate sugary drinks from the diet as she does drink sweet tea.  Continue regular exercise but recommend increase to 5 days a week for 20 minutes on the elliptical. Discussed trying her with [facility] but patient states she wants to avoid medications like Wegovy/Ozempic at this time.  She is agreeable to seeing a nutritionist. - Amb ref to Medical Nutrition Therapy-MNT - CBC  3. History of sleeve gastrectomy We will recheck CBC since the last 1 was done about a year ago.  We will also check B12 level.  Continue vitamin D supplement - CBC - Vitamin B12  4. Hot flash not due to menopause Improve with Veozah.  We will continue the [facility] and recheck liver profile today.  So far liver profile is within normal. - Fezolinetant (VEOZAH) 45 MG TABS; Take 1 tablet (45 mg total) by mouth daily.  Dispense: 90 tablet; Refill: 1  5. Medication monitoring encounter See #4 above - Hepatic Function Panel  6.  ILD (interstitial lung disease) (HCC) Followed by pulmonary.  On CellCept and pirfenidone. - CBC  7. Connective tissue disease (HCC) Followed by rheumatology through the [facility].  Stable on Plaquenil.  8. Gastroesophageal reflux disease without esophagitis - sucralfate (CARAFATE) 1 g tablet; 1 tab PO three times daily with meals PRN   Dispense: 90 tablet; Refill: 4  In regards to her request for [employer] letter, I recommend that she speaks with her rheumatologist and/or pulmonologist about whether the connective tissue disease is related to ILD and have one of them drop a letter for her.   Patient was given the opportunity to ask questions.  Patient verbalized understanding of the plan and was able to repeat key elements of the plan.   This documentation was completed using Paediatric nurse.  Any transcriptional errors are unintentional.  Orders Placed This Encounter  Procedures   CBC   Vitamin B12   Hepatic Function Panel   Amb ref to Medical Nutrition Therapy-MNT     Requested Prescriptions   Signed Prescriptions Disp Refills   Fezolinetant (VEOZAH) 45 MG TABS 90 tablet 1    Sig: Take 1 tablet (45 mg total) by mouth daily.   levocetirizine (XYZAL) 5 MG tablet 90 tablet 1    Sig: Take 1 tablet (5 mg total) by mouth every evening.   sucralfate (CARAFATE) 1 g tablet 90 tablet 4    Sig: 1 tab PO three times daily with meals PRN    Return in about 4 months (around 02/08/2024) for chronic ds management.  Jonah Blue, MD, FACP

## 2023-10-11 LAB — CBC
Hematocrit: 39.6 % (ref 34.0–46.6)
Hemoglobin: 12.4 g/dL (ref 11.1–15.9)
MCH: 29 pg (ref 26.6–33.0)
MCHC: 31.3 g/dL — ABNORMAL LOW (ref 31.5–35.7)
MCV: 93 fL (ref 79–97)
Platelets: 341 10*3/uL (ref 150–450)
RBC: 4.27 x10E6/uL (ref 3.77–5.28)
RDW: 12.3 % (ref 11.7–15.4)
WBC: 2.6 10*3/uL — ABNORMAL LOW (ref 3.4–10.8)

## 2023-10-11 LAB — HEPATIC FUNCTION PANEL
ALT: 14 [IU]/L (ref 0–32)
AST: 13 [IU]/L (ref 0–40)
Albumin: 4.3 g/dL (ref 3.8–4.9)
Alkaline Phosphatase: 64 [IU]/L (ref 44–121)
Bilirubin Total: 0.4 mg/dL (ref 0.0–1.2)
Bilirubin, Direct: 0.14 mg/dL (ref 0.00–0.40)
Total Protein: 7.8 g/dL (ref 6.0–8.5)

## 2023-10-11 LAB — VITAMIN B12: Vitamin B-12: 421 pg/mL (ref 232–1245)

## 2023-10-16 ENCOUNTER — Encounter: Payer: Self-pay | Admitting: Internal Medicine

## 2023-10-16 ENCOUNTER — Other Ambulatory Visit: Payer: Self-pay | Admitting: Internal Medicine

## 2023-10-16 DIAGNOSIS — R232 Flushing: Secondary | ICD-10-CM

## 2023-10-16 DIAGNOSIS — K219 Gastro-esophageal reflux disease without esophagitis: Secondary | ICD-10-CM

## 2023-10-16 MED ORDER — LEVOCETIRIZINE DIHYDROCHLORIDE 5 MG PO TABS: 5.0000 mg | ORAL_TABLET | Freq: Every evening | ORAL | 1 refills | Status: DC

## 2023-10-16 MED ORDER — SUCRALFATE 1 G PO TABS: ORAL_TABLET | ORAL | 4 refills | Status: DC

## 2023-10-16 MED ORDER — VEOZAH 45 MG PO TABS: 45.0000 mg | ORAL_TABLET | Freq: Every day | ORAL | 1 refills | Status: DC

## 2023-10-19 ENCOUNTER — Telehealth: Payer: Self-pay | Admitting: Internal Medicine

## 2023-10-19 ENCOUNTER — Encounter: Payer: Self-pay | Admitting: Pulmonary Disease

## 2023-10-19 NOTE — Telephone Encounter (Signed)
-----   Message from [facility] sent at 10/19/2023 10:08 AM EST ----- Regarding: RE: Low WBC Thanks for the message.  I have sent her a MyChart message requesting her to come back in a couple of weeks for a repeat CBC. ----- Message ----- From: Marcine Matar, MD Sent: 10/11/2023   9:32 AM EST To: Chilton Greathouse, MD Subject: Low WBC                                        Good morning Dr. Isaiah Serge.  I am the PCP for this patient.  I saw her yesterday and did some routine blood tests including CBC and LFTs.  White blood cell count is low and has declined since it was last checked in January of this year.  Please take a look. I know that CellCept and hydroxychloroquine, both of which you are taking, can affect blood cell counts.  Just wanted to make you aware and determine if closer monitoring is needed.  I will hold off on any referral to Heme/Onc as I think this is medication induce.  Advised her to inform her rheumatologist at the [facility] of this as well.

## 2023-11-06 ENCOUNTER — Encounter: Payer: 59 | Attending: Internal Medicine | Admitting: Dietician

## 2023-11-06 ENCOUNTER — Encounter: Payer: Self-pay | Admitting: Dietician

## 2023-11-06 VITALS — Ht 67.0 in | Wt 219.5 lb

## 2023-11-06 DIAGNOSIS — E66811 Obesity, class 1: Secondary | ICD-10-CM | POA: Diagnosis present

## 2023-11-06 NOTE — Progress Notes (Signed)
Medical Nutrition Therapy  Appointment Start time:  [phone]  Appointment End time:  1619  Primary concerns today: recent weight gain  Referral diagnosis: Obesity Class I [phone]) Preferred learning style: no preference indicated (auditory, visual, hands on, no preference indicated) Learning readiness: preparation (not ready, contemplating, ready, change in progress)  NUTRITION ASSESSMENT   Anthropometrics Weight: 219.5 lbs Height: 67 in BMI: 34.38 kg/m   Clinical Medical Hx: GERD, obesity, HTN, asthma, sleeve gastrectomy Medications: Prednisone, amlodipine, bacterin, Carafate, hydroxychlorquine, mycophenolate, Protonix, veozah, esbriet Labs: see EMR Notable Signs/Symptoms: GERD  Lifestyle & Dietary Hx  Pt states she is gaining weight, stating she has not changed her diet. Pt states she is on prednisone on and off for lung disease (interstitial lung disease and lung fibrosis). Pt states she fell in May, stating she was running from a turtle. Pt states she is a picky eater. Pt states she has terrible GERD. Pt states the last time she saw a dietitian was in 2012 when she had gastric band (2007). Pt states she had the band removed in 2016 and sleeve gastrectomy done at that time.  Estimated daily fluid intake: 64 oz Supplements: vit D (prescription) Sleep: hot flashes (menopause), sleep is okay Stress / self-care: 4 or 5 on a scale of 1-10; eat; reading; shopping Current average weekly physical activity: limited physical activity due to lung diseases.  24-Hr Dietary Recall First Meal: (0700) coffee with milk, vanilla, Splenda Snack:  Second Meal: (1100) breakfast meat and a side (rice, grits or eggs) Snack: (1600) sandwich ([place] bologna) chips, rice Third Meal: (1930-2000) meat and vegetable Snack: toast or occasionally ice cream Beverages: coffee, water  Estimated Energy Needs Calories: 1500  NUTRITION DIAGNOSIS  Overweight/obesity ([place]-3.3) related to past poor dietary  habits and physical inactivity as evidenced by completed bariatric surgery and following dietary guidelines for continued weight loss and healthy nutrition status.   NUTRITION INTERVENTION  Nutrition education (E-1) on the following topics:  Encouraged patient to honor their body's internal hunger and fullness cues.  Throughout the day, check in mentally and rate hunger. Stop eating when satisfied not full regardless of how much food is left on the plate.  Get more if still hungry 20-30 minutes later.  The key is to honor satisfaction so throughout the meal, rate fullness factor and stop when comfortably satisfied not physically full. The key is to honor hunger and fullness without any feelings of guilt or shame.  Pay attention to what the internal cues are, rather than any external factors. This will enhance the confidence you have in listening to your own body and following those internal cues enabling you to increase how often you eat when you are hungry not out of appetite and stop when you are satisfied not full.  Encouraged pt to continue to eat balanced meals inclusive of non starchy vegetables 2 times a day 7 days a week Encouraged pt to choose lean protein sources: limiting beef, pork, sausage, hotdogs, and lunch meat Encourage pt to choose healthy fats such as plant based limiting animal fats Encouraged pt to continue to drink a minium 64 fluid ounces with half being plain water to satisfy proper hydration   Reviewed bariatric maintenance plan Eat within 1 to 1.5 hours of waking and every 3-5 hours; typically 3 meals and 2-3 snacks per day This increases your metabolism and keeps you from feeling hungry and deprived later in the day. It is VERY important to eat on a schedule. Take 20-30 minutes to  eat and chew to applesauce consistency  Avoid high-calorie "snack" foods (chips, candy, crackers, pretzels, etc.)  Limit caffeinated beverages to 1 cup (8 oz.) per day to help reduce GERD  Handouts  Provided Include  Bariatric Maintenance Plan Meal Ideas  Learning Style & Readiness for Change Teaching method utilized: Visual & Auditory  Demonstrated degree of understanding via: Teach Back  Barriers to learning/adherence to lifestyle change: GERD, mobility/lung capacity  Goals Established by Pt Increase physical activity; chair exercises/ resistance training/ weights; 4 days per week for 10 minutes per day at 1 pm in the afternoon, to help with stress. Eat small frequent meals; eat every few hours, to help reduce reflux Restart supplements for bariatrics Limit caffeine to one cup per day to help reduce GERD Follow-up with a bariatric surgeon  MONITORING & EVALUATION Dietary intake, weekly physical activity.  Next Steps  Patient is to return in 2 months for follow-up.

## 2023-11-07 ENCOUNTER — Other Ambulatory Visit: Payer: Self-pay | Admitting: Internal Medicine

## 2023-11-07 DIAGNOSIS — R053 Chronic cough: Secondary | ICD-10-CM

## 2023-11-07 DIAGNOSIS — J849 Interstitial pulmonary disease, unspecified: Secondary | ICD-10-CM

## 2023-11-10 ENCOUNTER — Other Ambulatory Visit: Payer: Self-pay | Admitting: Internal Medicine

## 2023-11-10 DIAGNOSIS — K219 Gastro-esophageal reflux disease without esophagitis: Secondary | ICD-10-CM

## 2023-11-27 ENCOUNTER — Telehealth: Payer: Self-pay | Admitting: Pulmonary Disease

## 2023-11-27 DIAGNOSIS — J849 Interstitial pulmonary disease, unspecified: Secondary | ICD-10-CM

## 2023-11-27 NOTE — Telephone Encounter (Signed)
[facility] ref[facility].  Pirfenidone (ESBRIET) 267 MG TABS   Please refill for PT.

## 2023-12-01 ENCOUNTER — Ambulatory Visit
Admission: RE | Admit: 2023-12-01 | Discharge: 2023-12-01 | Disposition: A | Discharge status: Home / Self Care | Payer: 59 | Source: Ambulatory Visit | Attending: Nurse Practitioner

## 2023-12-01 DIAGNOSIS — J849 Interstitial pulmonary disease, unspecified: Secondary | ICD-10-CM

## 2023-12-06 MED ORDER — PIRFENIDONE 267 MG PO TABS: 801.0000 mg | ORAL_TABLET | Freq: Three times a day (TID) | ORAL | 5 refills | Status: AC

## 2023-12-06 NOTE — Telephone Encounter (Signed)
Refill sent for PIRFENIDONE to  [facility], [place] . Most recent OV faxed to pharmacy  Dose: 810mg  three times daily  Last OV: 09/12/2023 Provider: Dr. Isaiah Serge Pertinent labs: LFTs on 10/10/23 wnl  Next OV: 02/05/24  Chesley Mires, PharmD, MPH, BCPS Clinical Pharmacist (Rheumatology and Pulmonology)

## 2023-12-12 ENCOUNTER — Ambulatory Visit (INDEPENDENT_AMBULATORY_CARE_PROVIDER_SITE_OTHER): Payer: No Typology Code available for payment source | Admitting: Pulmonary Disease

## 2023-12-12 DIAGNOSIS — J849 Interstitial pulmonary disease, unspecified: Secondary | ICD-10-CM | POA: Diagnosis not present

## 2023-12-12 LAB — PULMONARY FUNCTION TEST
DL/VA % pred: 88 %
DL/VA: 3.72 ml/min/mmHg/L
DLCO cor % pred: 61 %
DLCO cor: 14.1 ml/min/mmHg
DLCO unc % pred: 61 %
DLCO unc: 14.1 ml/min/mmHg
FEF 25-75 Pre: 1.64 L/s
FEF2575-%Pred-Pre: 57 %
FEV1-%Pred-Pre: 67 %
FEV1-Pre: 2.05 L
FEV1FVC-%Pred-Pre: 98 %
FEV6-%Pred-Pre: 69 %
FEV6-Pre: 2.62 L
FEV6FVC-%Pred-Pre: 102 %
FVC-%Pred-Pre: 67 %
FVC-Pre: 2.62 L
Pre FEV1/FVC ratio: 78 %
Pre FEV6/FVC Ratio: 100 %

## 2023-12-12 NOTE — Patient Instructions (Addendum)
Spirometry and DLCO Performed Today.  

## 2023-12-12 NOTE — Progress Notes (Addendum)
Spirometry and DLCO Performed Today.  

## 2023-12-22 ENCOUNTER — Ambulatory Visit: Payer: No Typology Code available for payment source | Admitting: Pulmonary Disease

## 2023-12-26 ENCOUNTER — Ambulatory Visit: Payer: 59 | Admitting: Dietician

## 2023-12-28 ENCOUNTER — Telehealth (INDEPENDENT_AMBULATORY_CARE_PROVIDER_SITE_OTHER): Payer: 59 | Admitting: Pulmonary Disease

## 2023-12-28 ENCOUNTER — Telehealth: Payer: 59 | Admitting: Pulmonary Disease

## 2023-12-28 DIAGNOSIS — Z8616 Personal history of COVID-19: Secondary | ICD-10-CM | POA: Diagnosis not present

## 2023-12-28 DIAGNOSIS — Z5181 Encounter for therapeutic drug level monitoring: Secondary | ICD-10-CM

## 2023-12-28 DIAGNOSIS — J849 Interstitial pulmonary disease, unspecified: Secondary | ICD-10-CM | POA: Diagnosis not present

## 2023-12-28 MED ORDER — SULFAMETHOXAZOLE-TRIMETHOPRIM 800-160 MG PO TABS: 1.0000 | ORAL_TABLET | ORAL | 3 refills | Status: DC

## 2023-12-28 NOTE — Patient Instructions (Signed)
VISIT SUMMARY:  Today, we reviewed your condition of connective tissue disease-related interstitial lung disease, which remains stable. Your recent CT scans and lung function tests show that your disease is well-managed, and your oxygen exchange capacity has improved. We discussed your current medications and the plan for ongoing monitoring.  YOUR PLAN:  -CONNECTIVE TISSUE DISEASE-RELATED INTERSTITIAL LUNG DISEASE: Connective tissue disease-related interstitial lung disease is a condition where the lung tissue becomes scarred and stiff due to an underlying connective tissue disease. Your recent tests show that your condition is stable and your lung function has improved. You should continue taking your current medications: CellCept 1.5 grams twice daily, Esbriet, Bactrim on Monday, Wednesday, and Friday, and Plaquenil as prescribed by your rheumatologist. We will monitor your white blood cell count with a blood test tomorrow due to the potential immunosuppressive effects of CellCept. If your white blood cell count is low, we may consider reducing your CellCept dose. Your Bactrim prescription will be changed to a 90-day supply with three refills.  INSTRUCTIONS:  Please schedule a follow-up appointment in six months. We will review your blood test results and consider any necessary adjustments to your medication at that time.

## 2023-12-28 NOTE — Progress Notes (Signed)
Chelsea Jennings    [id number]    Sep 08, 1971  Primary Care Physician:Johnson, Binnie Rail, MD  Referring Physician: Marcine Matar, MD [address] [place] [address] [place],  [place] [address]  Virtual Visit via Video Note  I connected with Akshita Italiano on 12/28/23 at  3:00 PM EST by a video enabled telemedicine application and verified that I am speaking with the correct person using two identifiers.  Location: Patient: Home  Provider: Pulmonary office   I discussed the limitations of evaluation and management by telemedicine and the availability of in person appointments. The patient expressed understanding and agreed to proceed.  Chief complaint: Follow-up for CTD related interstitial lung disease, NSIP  HPI: 53 y.o.  with history of hypertension, unspecified connective tissue disease, NSIP interstitial lung disease here to establish care She was previously followed by Dr.  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  She was put on prednisone 40 mg and then CellCept which is titrated up to 1 g twice daily.  She was able to be weaned off prednisone in December 2021 with improvement in symptoms of dyspnea. She also follows with Dr. Gevena Mart, rheumatology at the [facility] and is on Plaquenil. She completed pulmonary rehab in 2021  States that her dyspnea on exertion has been stable on CellCept.  She is also on Bactrim for prophylaxis. Had echocardiogram in 2021 at the [place] with no evidence of pulmonary hypertension  She also has history of GERD and was on omeprazole but developed side effects of rash.  She is currently on pantoprazole 20 mg a day which appears to control her symptoms Postnasal drip was treated with over-the-counter antihistamines and nasal spray.  She was on chlorpheniramine which at least helped with her anxiety.  2023 She developed COVID-19 in January 2023 and treated as an  outpatient with Paxlovid.  Subsequently she had treatment with Z-Pak and amoxicillin for persistent symptoms.    Started Durel Salts in July 2023.  This was poorly tolerated due to diarrhea and fatigue Started Esbriet in November 2023 which is better tolerated She is on PPI twice daily for cough  Pets: She is to have cats in the past.  No pets currently Occupation: Works for [employer].  Previously in the [employer] Exposures: Exposed to chemicals at [employer] camp in [place] Smoking history: Never smoker Travel history: No significant recent travel Relevant family history:No significant family history of lung disease  Interim History: Discussed the use of AI scribe software for clinical note transcription with the patient, who gave verbal consent to proceed.  Chelsea Jennings is a 53 year old female with connective tissue disease-related interstitial lung disease who presents for follow-up.  She is being followed for connective tissue disease-related interstitial lung disease, which is currently stable. Recent CT scans and lung function tests confirm this stability.  Her current medication regimen includes CellCept at 1.5 grams twice daily, Esbriet, Bactrim on Monday, Wednesday, and Friday, and Plaquenil, which she takes two tablets once daily. The Plaquenil is prescribed by her rheumatologist. She tolerates CellCept well and her breathing is stable.   Outpatient Encounter Medications as of 12/28/2023  Medication Sig   albuterol (VENTOLIN HFA) 108 (90 Base) MCG/ACT inhaler INHALE 2 PUFFS EVERY 6 HOURS AS NEEDED FOR WHEEZE/SHORTNESS OF BREATH   amLODipine (NORVASC) 5 MG tablet Take 5 mg by mouth daily.   benzonatate (TESSALON)  200 MG capsule Take 1 capsule (200 mg total) by mouth 3 (three) times daily as needed for cough.   clobetasol ointment (TEMOVATE) 0.05 % Apply 1 application topically 2 (two) times daily.   dorzolamide-timolol (COSOPT) 22.3-6.8 MG/ML ophthalmic solution 1 drop 2 (two) times daily.    Fezolinetant (VEOZAH) 45 MG TABS Take 1 tablet (45 mg total) by mouth daily.   HYDROcodone bit-homatropine (HYCODAN) 5-1.5 MG/5ML syrup Take 5 mLs by mouth every 8 (eight) hours as needed for cough.   hydroxychloroquine (PLAQUENIL) 200 MG tablet Take 200 mg by mouth 2 (two) times daily.   latanoprost (XALATAN) 0.005 % ophthalmic solution Place 1 drop into both eyes at bedtime.   levocetirizine (XYZAL) 5 MG tablet Take 1 tablet (5 mg total) by mouth every evening.   mycophenolate (CELLCEPT) 500 MG tablet TAKE 3 TABLETS BY MOUTH TWICE DAILY.   pantoprazole (PROTONIX) 40 MG tablet Take 1 tablet (40 mg total) by mouth 2 (two) times daily.   Pirfenidone (ESBRIET) 267 MG TABS Take 3 tablets (801 mg total) by mouth 3 (three) times daily with meals. Month 2 and onwards: take 3 tablets by mouth three times daily with meals   simethicone (MYLICON) 80 MG chewable tablet Chew 80 mg by mouth.   sucralfate (CARAFATE) 1 g tablet TAKE 1 TABLET THREE TIMES DAILY WITH MEALS AS NEEDED   sulfamethoxazole-trimethoprim (BACTRIM DS) 800-160 MG tablet Take 1 tablet by mouth 3 (three) times a week.   No facility-administered encounter medications on file as of 12/28/2023.    Physical Exam: Tele  Data Reviewed: [facility]-FVC 3.05 [79%], TLC 4.39 [80%], DLCO 65% 02/2020-FVC 2.47 [72%], TLC 3.39 [60%], DLCO 42%  CT chest 03/2020-baseline ILD with subpleural sparing, groundglass opacities with traction bronchiectasis.  Cysts versus honeycombing at the base.  Worsening compared to 2019    Echocardiogram May 2021-no pulmonary hypertension, positive for diastolic dysfunction  Imaging: High-res CT 09/14/2021-probable UIP pattern interstitial pneumonia, soft tissue mass in the anterior mediastinum, low-attenuation lesion in the liver.  MRI abdomen 10/13/2021-benign hepatic hemangiomas in the liver  CT chest 05/11/2022-Mild progression of interstitial lung disease with suggestion of  honeycombing.  CT high-resolution 12/01/2023-stable findings of interstitial lung disease in alternate pattern, likely NSIP I have reviewed the images personally.  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 Recent CT scan shows well-managed disease from June 2024 to January 2025. Lung function tests indicate improved oxygen exchange capacity, increasing from 55% to 61%.  Ofev was poorly tolerated in the past.  Current medications include CellCept, Esbriet, Bactrim, and Plaquenil, all well-tolerated. Discussed monitoring white blood cell count due to potential immunosuppressive effects of CellCept. Potential dose reduction if blood counts are low.  - Continue CellCept 1.5 grams twice daily - Continue Esbriet - Continue Bactrim on Monday, Wednesday, and Friday - Continue Plaquenil as prescribed by rheumatologist - Order blood tests tomorrow to monitor white blood cell count - Change Bactrim prescription to a 90-day supply with three refills - Follow up in six months - Review blood test results and consider reducing CellCept dose if  white blood cell count is low  Post COVID-19 Suspect COVID may have set off progression of her ILD.  She is off prednisone.  On antifibrotics as above  Chronic cough Felt to be multifactorial from interstitial lung disease, GERD, postnasal drip Continue PPI twice daily, over-the-counter antihistamine  Liver lesions Noted on high-res CT Follow-up  MRI shows benign hemangiomas   Follow-up - Schedule follow-up appointment in six months.    Plan/Recommendations: Continue CellCept, Bactrim, Plaquenil Continue Esbriet Labs for monitoring  Chilton Greathouse MD [facility] 12/28/2023, 2:53 PM  CC: Marcine Matar, MD   I discussed the assessment and treatment plan with the patient. The patient was provided an opportunity to ask questions and all were answered. The patient agreed with the plan and demonstrated an understanding of the instructions.   The patient was advised to call back or seek an in-person evaluation if the symptoms worsen or if the condition fails to improve as anticipated.

## 2024-01-01 LAB — COMPREHENSIVE METABOLIC PANEL
ALT: 9 U/L (ref 0–35)
AST: 26 U/L (ref 0–37)
Albumin: 4.5 g/dL (ref 3.5–5.2)
Alkaline Phosphatase: 43 U/L (ref 39–117)
BUN: 9 mg/dL (ref 6–23)
CO2: 23 meq/L (ref 19–32)
Calcium: 9.3 mg/dL (ref 8.4–10.5)
Chloride: 108 meq/L (ref 96–112)
Creatinine, Ser: 0.76 mg/dL (ref 0.40–1.20)
GFR: 90.01 mL/min (ref >60.00)
Glucose, Bld: 108 mg/dL — ABNORMAL HIGH (ref 70–99)
Potassium: 4.9 meq/L (ref 3.5–5.1)
Sodium: 143 meq/L (ref 135–145)
Total Bilirubin: 0.4 mg/dL (ref 0.2–1.2)
Total Protein: 8.3 g/dL (ref 6.0–8.3)

## 2024-01-01 LAB — CBC WITH DIFFERENTIAL/PLATELET
Basophils Absolute: 0 10*3/uL (ref 0.0–0.1)
Basophils Relative: 0.6 % (ref 0.0–3.0)
Eosinophils Absolute: 0 10*3/uL (ref 0.0–0.7)
Eosinophils Relative: 0.5 % (ref 0.0–5.0)
HCT: 41.6 % (ref 36.0–46.0)
Hemoglobin: 13.3 g/dL (ref 12.0–15.0)
Lymphocytes Relative: 34.7 % (ref 12.0–46.0)
Lymphs Abs: 0.8 10*3/uL (ref 0.7–4.0)
MCHC: 31.9 g/dL (ref 30.0–36.0)
MCV: 91.3 fL (ref 78.0–100.0)
Monocytes Absolute: 0.3 10*3/uL (ref 0.1–1.0)
Monocytes Relative: 12.9 % — ABNORMAL HIGH (ref 3.0–12.0)
Neutro Abs: 1.3 10*3/uL — ABNORMAL LOW (ref 1.4–7.7)
Neutrophils Relative %: 51.3 % (ref 43.0–77.0)
Platelets: 341 10*3/uL (ref 150.0–400.0)
RBC: 4.55 Mil/uL (ref 3.87–5.11)
RDW: 13.5 % (ref 11.5–15.5)
WBC: 2.4 10*3/uL — ABNORMAL LOW (ref 4.0–10.5)

## 2024-01-02 ENCOUNTER — Encounter: Payer: Self-pay | Admitting: Pulmonary Disease

## 2024-01-02 ENCOUNTER — Other Ambulatory Visit: Payer: Self-pay | Admitting: Pulmonary Disease

## 2024-01-02 DIAGNOSIS — Z5181 Encounter for therapeutic drug level monitoring: Secondary | ICD-10-CM

## 2024-01-02 DIAGNOSIS — J849 Interstitial pulmonary disease, unspecified: Secondary | ICD-10-CM

## 2024-01-02 NOTE — Progress Notes (Signed)
 [facility] note  Reviewed CBC with patient.  WBC count is low at 2.4 Reduce CellCept dose to 500 mg twice daily from current dose of 1500 mg twice daily Recheck CBC in 1 month.  Patient will come to the office to get this drawn.  Chilton Greathouse MD [facility] See Amion for pager  If no response to pager , please call [phone] until 7pm After 7:00 pm call Elink  [phone] 01/02/2024, 9:53 AM

## 2024-01-08 ENCOUNTER — Ambulatory Visit: Payer: 59 | Admitting: Dietician

## 2024-02-02 ENCOUNTER — Encounter: Payer: Self-pay | Admitting: Pulmonary Disease

## 2024-02-02 LAB — CBC WITH DIFFERENTIAL/PLATELET
Basophils Absolute: 0 10*3/uL (ref 0.0–0.1)
Basophils Relative: 0.9 % (ref 0.0–3.0)
Eosinophils Absolute: 0 10*3/uL (ref 0.0–0.7)
Eosinophils Relative: 0.5 % (ref 0.0–5.0)
HCT: 40.1 % (ref 36.0–46.0)
Hemoglobin: 13.1 g/dL (ref 12.0–15.0)
Lymphocytes Relative: 35.7 % (ref 12.0–46.0)
Lymphs Abs: 1 10*3/uL (ref 0.7–4.0)
MCHC: 32.6 g/dL (ref 30.0–36.0)
MCV: 89.5 fl (ref 78.0–100.0)
Monocytes Absolute: 0.4 10*3/uL (ref 0.1–1.0)
Monocytes Relative: 13.7 % — ABNORMAL HIGH (ref 3.0–12.0)
Neutro Abs: 1.4 10*3/uL (ref 1.4–7.7)
Neutrophils Relative %: 49.2 % (ref 43.0–77.0)
Platelets: 308 10*3/uL (ref 150.0–400.0)
RBC: 4.49 Mil/uL (ref 3.87–5.11)
RDW: 13.6 % (ref 11.5–15.5)
WBC: 2.8 10*3/uL — ABNORMAL LOW (ref 4.0–10.5)

## 2024-02-05 ENCOUNTER — Ambulatory Visit: Payer: 59 | Admitting: Pulmonary Disease

## 2024-02-09 ENCOUNTER — Telehealth: Payer: Self-pay

## 2024-02-09 ENCOUNTER — Ambulatory Visit: Payer: 59 | Admitting: Internal Medicine

## 2024-02-09 DIAGNOSIS — J849 Interstitial pulmonary disease, unspecified: Secondary | ICD-10-CM

## 2024-02-09 DIAGNOSIS — Z5181 Encounter for therapeutic drug level monitoring: Secondary | ICD-10-CM

## 2024-02-09 NOTE — Telephone Encounter (Signed)
 I called and discussed with patient and reviewed lab results.  WBC count is slightly improved to 2.8 Advised to continue CellCept at current reduced dose as her counts are still not back to normal Follow-up CBC ordered for 1 month.  Nothing further needed.

## 2024-02-09 NOTE — Telephone Encounter (Signed)
 Copied from CRM [id number]. Topic: Clinical - Lab/Test Results >> Feb 09, 2024 10:09 AM Renie Ora wrote: Reason for CRM: Patient came in on last week, and she would like to know her bloodwork results and she would like to know if he would like her to stay on the lower medication or go back to the normal one.  ATC pt x1 LVMTCB Pt is concerned if she needs to reduce the CellCept dose based off results of lab work. Please advise Dr. Isaiah Serge

## 2024-02-09 NOTE — Telephone Encounter (Signed)
 Copied from CRM [id number]. Topic: Clinical - Lab/Test Results >> Feb 09, 2024 10:09 AM Renie Ora wrote: Reason for CRM: Patient came in on last week, and she would like to know her bloodwork results and she would like to know if he would like her to stay on the lower medication or go back to the normal one.  Dr Isaiah Serge, please advise.

## 2024-02-12 ENCOUNTER — Other Ambulatory Visit: Payer: Self-pay | Admitting: Internal Medicine

## 2024-02-16 ENCOUNTER — Ambulatory Visit: Payer: 59 | Attending: Internal Medicine | Admitting: Internal Medicine

## 2024-02-16 ENCOUNTER — Encounter: Payer: Self-pay | Admitting: Internal Medicine

## 2024-02-16 VITALS — BP 116/76 | HR 77 | Temp 98.1°F | Ht 67.0 in | Wt 217.2 lb

## 2024-02-16 DIAGNOSIS — Z1501 Genetic susceptibility to malignant neoplasm of breast: Secondary | ICD-10-CM

## 2024-02-16 DIAGNOSIS — R232 Flushing: Secondary | ICD-10-CM

## 2024-02-16 DIAGNOSIS — M359 Systemic involvement of connective tissue, unspecified: Secondary | ICD-10-CM | POA: Diagnosis not present

## 2024-02-16 DIAGNOSIS — D702 Other drug-induced agranulocytosis: Secondary | ICD-10-CM

## 2024-02-16 DIAGNOSIS — J849 Interstitial pulmonary disease, unspecified: Secondary | ICD-10-CM

## 2024-02-16 DIAGNOSIS — I10 Essential (primary) hypertension: Secondary | ICD-10-CM

## 2024-02-16 MED ORDER — VEOZAH 45 MG PO TABS: 45.0000 mg | ORAL_TABLET | Freq: Every day | ORAL | 1 refills | Status: DC

## 2024-02-16 NOTE — Progress Notes (Signed)
 Patient ID: Chelsea Jennings, female    DOB: 1971-04-19  MRN: [id number]  CC: Hypertension (HTN f/u. Med refill. Franchot Erichsen lab work from pulmonologist )   Subjective: Chelsea Jennings is a 53 y.o. female who presents for chronic ds management. Her concerns today include:  Patient with history of HTN, GERD, Fhx breast CA ([facility] 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  Followed by rheumatology at [facility] and pulmonary Dr. Isaiah Serge), gastric sleeve   Discussed the use of AI scribe software for clinical note transcription with the patient, who gave verbal consent to proceed.  History of Present Illness   The patient, with a history of interstitial lung disease and connective tissue disease, presents for a four month follow-up visit.   ILD:  She reports a recent decrease in her CellCept dosage by Dr. Isaiah Serge due to a persistently low white blood cell count. Despite the significant reduction in medication, she has not noticed any changes in her breathing or coughing. She has been taking vitamin A, folic acid, and a B complex supplement in an attempt to increase her white blood cell count.  CTD:  In addition to her lung disease, she continues to see rheumatology at the [facility] who manages her connective tissue disease with Plaquenil and has recently added a 4% Lidocaine cream for occasional shoulder, knee, and back pain.   HTN: She also reports good control of her blood pressure with Amlodipine 5mg  daily. Checks BP 3-4x/wk with good results.  For her menopausal symptoms, she has found relief with Veozah, despite occasional mild hot flashes. She has been undergoing frequent breast cancer screenings due to a BARD1 gene mutation, with a recent MRI  12/2023 showing no evidence of malignancy. She is scheduled for a mammogram in September.      HM:  reports having Tdapt 02/2023 at [facility]  Patient Active  Problem List   Diagnosis Date Noted   Connective tissue disease (HCC) 10/10/2023   Class 1 obesity with serious comorbidity and body mass index (BMI) of 34.0 to 34.9 in adult 10/10/2023   History of sleeve gastrectomy 10/10/2023   Mixed connective tissue disease (HCC) 09/12/2023   Essential hypertension 06/02/2023   Chronic cough 06/02/2023   GERD (gastroesophageal reflux disease) 10/26/2022   BARD1 gene mutation positive 12/29/2021   Genetic testing 12/16/2021   Family history of breast cancer 12/06/2021   COVID-19 virus infection 11/30/2021   Acute bronchitis 11/30/2021   ILD (interstitial lung disease) (HCC) 11/30/2021     Current Outpatient Medications on File Prior to Visit  Medication Sig Dispense Refill   albuterol (VENTOLIN HFA) 108 (90 Base) MCG/ACT inhaler INHALE 2 PUFFS EVERY 6 HOURS AS NEEDED FOR WHEEZE/SHORTNESS OF BREATH 6.7 each 2   amLODipine (NORVASC) 5 MG tablet Take 5 mg by mouth daily.     benzonatate (TESSALON) 200 MG capsule Take 1 capsule (200 mg total) by mouth 3 (three) times daily as needed for cough. 30 capsule 1   clobetasol ointment (TEMOVATE) 0.05 % Apply 1 application topically 2 (two) times daily.     dorzolamide-timolol (COSOPT) 22.3-6.8 MG/ML ophthalmic solution 1 drop 2 (two) times daily.     HYDROcodone bit-homatropine (HYCODAN) 5-1.5 MG/5ML syrup Take 5 mLs by mouth every 8 (eight) hours as needed for cough. 120 mL 0   hydroxychloroquine (PLAQUENIL) 200 MG tablet Take 200 mg by mouth 2 (two) times daily.  latanoprost (XALATAN) 0.005 % ophthalmic solution Place 1 drop into both eyes at bedtime.     levocetirizine (XYZAL) 5 MG tablet Take 1 tablet (5 mg total) by mouth every evening. 90 tablet 1   mycophenolate (CELLCEPT) 500 MG tablet TAKE 3 TABLETS BY MOUTH TWICE DAILY. 180 tablet 5   pantoprazole (PROTONIX) 20 MG tablet Take 20 mg by mouth 2 (two) times daily.     Pirfenidone (ESBRIET) 267 MG TABS Take 3 tablets (801 mg total) by mouth 3 (three)  times daily with meals. Month 2 and onwards: take 3 tablets by mouth three times daily with meals 270 tablet 5   simethicone (MYLICON) 80 MG chewable tablet Chew 80 mg by mouth.     sucralfate (CARAFATE) 1 g tablet TAKE 1 TABLET THREE TIMES DAILY WITH MEALS AS NEEDED 270 tablet 1   sulfamethoxazole-trimethoprim (BACTRIM DS) 800-160 MG tablet Take 1 tablet by mouth 3 (three) times a week. 36 tablet 3   No current facility-administered medications on file prior to visit.    Allergies  Allergen Reactions   Nintedanib Diarrhea and Nausea Only    Dizziness, fatigue   Prilosec [Omeprazole] Other (See Comments)    Alopecia   Famotidine Anxiety and Palpitations   Premarin [Estrogens Conjugated] Rash    Social History   Socioeconomic History   Marital status: Single    Spouse name: Not on file   Number of children: 0   Years of education: Not on file   Highest education level: Associate degree: occupational, Scientist, product/process development, or vocational program  Occupational History   Occupation: Retail banker: [employer]  Tobacco Use   Smoking status: Never   Smokeless tobacco: Never  Vaping Use   Vaping status: Never Used  Substance and Sexual Activity   Alcohol use: Not Currently   Drug use: Never   Sexual activity: Not on file  Other Topics Concern   Not on file  Social History Narrative   Not on file   Social Drivers of Health   Financial Resource Strain: Low Risk  (02/15/2024)   Overall Financial Resource Strain (CARDIA)    Difficulty of Paying Living Expenses: Not very hard  Food Insecurity: No Food Insecurity (02/15/2024)   Hunger Vital Sign    Worried About Running Out of Food in the Last Year: Never true    Ran Out of Food in the Last Year: Never true  Transportation Needs: No Transportation Needs (02/15/2024)   PRAPARE - Administrator, Civil Service (Medical): No    Lack of Transportation (Non-Medical): No  Physical Activity: Insufficiently Active  (02/15/2024)   Exercise Vital Sign    Days of Exercise per Week: 2 days    Minutes of Exercise per Session: 20 min  Stress: Stress Concern Present (02/15/2024)   [employer] of Occupational Health - Occupational Stress Questionnaire    Feeling of Stress : Rather much  Social Connections: Socially Isolated (02/15/2024)   Social Connection and Isolation Panel [NHANES]    Frequency of Communication with Friends and Family: More than three times a week    Frequency of Social Gatherings with Friends and Family: Once a week    Attends Religious Services: Never    Database administrator or Organizations: No    Attends Engineer, structural: Not on file    Marital Status: Divorced  Intimate Partner Violence: Not on file    Family History  Problem Relation Age of Onset  Breast cancer Mother        dx 47s; dx 53 (bilateral w/ mets)   Throat cancer Maternal Uncle    Breast cancer Paternal Aunt        dx 69s   Breast cancer Paternal Grandmother        dx before 54   Brain cancer Other        MGM's brothers x2   Lung cancer Other        MGM's brother    Past Surgical History:  Procedure Laterality Date   ABDOMINAL HYSTERECTOMY     LAPAROSCOPIC GASTRIC BAND REMOVAL WITH LAPAROSCOPIC GASTRIC SLEEVE RESECTION  04/2015    ROS: Review of Systems Negative except as stated above  PHYSICAL EXAM: BP 116/76 (BP Location: Left Arm, Patient Position: Sitting, Cuff Size: Normal)   Pulse 77   Temp 98.1 F (36.7 C) (Oral)   Ht 5\' 7"  (1.702 m)   Wt 217 lb 3.2 oz (98.5 kg)   SpO2 99%   BMI 34.02 kg/m   Wt Readings from Last 3 Encounters:  02/16/24 217 lb 3.2 oz (98.5 kg)  12/12/23 213 lb 12.8 oz (97 kg)  11/06/23 219 lb 8 oz (99.6 kg)    Physical Exam  General appearance - alert, well appearing, and in no distress Mental status - normal mood, behavior, speech, dress, motor activity, and thought processes Neck - supple, no significant adenopathy Chest - clear to  auscultation, no wheezes, rales or rhonchi, symmetric air entry Heart - normal rate, regular rhythm, normal S1, S2, no murmurs, rubs, clicks or gallops Extremities - peripheral pulses normal, no pedal edema, no clubbing or cyanosis      Latest Ref Rng & Units 12/29/2023   10:34 AM 10/10/2023   11:53 AM 07/12/2023    3:54 PM  CMP  Glucose 70 - 99 mg/dL 161   70   BUN 6 - 23 mg/dL 9   9   Creatinine 0.96 - 1.20 mg/dL 0.45   4.09   Sodium 811 - 145 mEq/L 143   138   Potassium 3.5 - 5.1 mEq/L 4.9   4.0   Chloride 96 - 112 mEq/L 108   103   CO2 19 - 32 mEq/L 23   29   Calcium 8.4 - 10.5 mg/dL 9.3   9.6   Total Protein 6.0 - 8.3 g/dL 8.3  7.8  7.7   Total Bilirubin 0.2 - 1.2 mg/dL 0.4  0.4  0.3   Alkaline Phos 39 - 117 U/L 43  64  60   AST 0 - 37 U/L 26  13  17    ALT 0 - 35 U/L 9  14  11     Lipid Panel  No results found for: "CHOL", "TRIG", "HDL", "CHOLHDL", "VLDL", "LDLCALC", "LDLDIRECT"  CBC    Component Value Date/Time   WBC 2.8 (L) 02/02/2024 1105   RBC 4.49 02/02/2024 1105   HGB 13.1 02/02/2024 1105   HGB 12.4 10/10/2023 1153   HCT 40.1 02/02/2024 1105   HCT 39.6 10/10/2023 1153   PLT 308.0 02/02/2024 1105   PLT 341 10/10/2023 1153   MCV 89.5 02/02/2024 1105   MCV 93 10/10/2023 1153   MCH 29.0 10/10/2023 1153   MCHC 32.6 02/02/2024 1105   RDW 13.6 02/02/2024 1105   RDW 12.3 10/10/2023 1153   LYMPHSABS 1.0 02/02/2024 1105   MONOABS 0.4 02/02/2024 1105   EOSABS 0.0 02/02/2024 1105   BASOSABS 0.0 02/02/2024 1105  ASSESSMENT AND PLAN: 1. Essential hypertension (Primary) At goal.  Continue amlodipine 5 mg daily.  2. Hot flash not due to menopause Well-controlled with Veozah.  Refills given.  Recent LFTs done in February of this year normal. - Fezolinetant (VEOZAH) 45 MG TABS; Take 1 tablet (45 mg total) by mouth daily.  Dispense: 90 tablet; Refill: 1  3. ILD (interstitial lung disease) (HCC) Followed by pulmonary Dr. Isaiah Serge.  Patient reports no increased cough or  shortness of breath with recent decrease in dose of CellCept.  4. Connective tissue disease (HCC) Followed by rheumatology.  Stable on Plaquenil  5. Other drug-induced neutropenia (HCC) See #3 above  6. BARD1 gene mutation positive I was able to print a copy of her recent breast MRI report to be scanned and put in our system.  Patient was given the opportunity to ask questions.  Patient verbalized understanding of the plan and was able to repeat key elements of the plan.   This documentation was completed using Paediatric nurse.  Any transcriptional errors are unintentional.  No orders of the defined types were placed in this encounter.    Requested Prescriptions   Signed Prescriptions Disp Refills   Fezolinetant (VEOZAH) 45 MG TABS 90 tablet 1    Sig: Take 1 tablet (45 mg total) by mouth daily.    Return in about 4 months (around 06/17/2024).  Jonah Blue, MD, FACP

## 2024-02-16 NOTE — Patient Instructions (Signed)
 VISIT SUMMARY:  You came in today for your four-month follow-up visit. We discussed your interstitial lung disease, connective tissue disease, hypertension, and menopausal symptoms. We also reviewed your recent breast cancer screenings and general health maintenance.  YOUR PLAN:  -INTERSTITIAL LUNG DISEASE: Interstitial lung disease is a group of disorders that cause scarring of the lungs. Your CellCept dosage has been reduced to 500 mg twice daily due to a low white blood cell count, but you have not noticed any changes in your symptoms. You should continue taking CellCept and Perfenadone as prescribed. You may consider taking a standard dose of B12 supplements if you wish.  -CONNECTIVE TISSUE DISEASE: Connective tissue disease affects the tissues that support, bind, or separate other tissues and organs. You are managing this condition with Plaquenil and using 4% lidocaine cream as needed for joint pain. Continue with this treatment plan.  -HYPERTENSION: Hypertension, or high blood pressure, is well-controlled with your current medication. Continue taking Amlodipine 5 mg daily and monitor your blood pressure at home 3-4 times a week.  -MENOPAUSAL SYMPTOMS: Menopausal symptoms, such as hot flashes, have improved with Veozah. Your liver function tests were normal in February. We will send a refill for Veozah and continue to monitor your liver function as needed.  -GENERAL HEALTH MAINTENANCE: You are up to date with your breast cancer screenings due to your BRCA1 mutation. Your recent MRI showed no signs of cancer, and you have a mammogram scheduled for September 9th. You received your tetanus and first shingles vaccine in April last year. Please ensure you complete your mammogram as scheduled and verify your vaccination status.  INSTRUCTIONS:  Please schedule a follow-up visit in four months to reassess your conditions and treatment plans.

## 2024-02-28 ENCOUNTER — Encounter: Payer: Self-pay | Admitting: Pulmonary Disease

## 2024-03-01 ENCOUNTER — Encounter: Payer: Self-pay | Admitting: Pulmonary Disease

## 2024-03-01 ENCOUNTER — Other Ambulatory Visit (INDEPENDENT_AMBULATORY_CARE_PROVIDER_SITE_OTHER)

## 2024-03-01 DIAGNOSIS — J849 Interstitial pulmonary disease, unspecified: Secondary | ICD-10-CM | POA: Diagnosis not present

## 2024-03-01 DIAGNOSIS — Z5181 Encounter for therapeutic drug level monitoring: Secondary | ICD-10-CM

## 2024-03-01 LAB — CBC WITH DIFFERENTIAL/PLATELET
Basophils Absolute: 0 10*3/uL (ref 0.0–0.1)
Basophils Relative: 1.1 % (ref 0.0–3.0)
Eosinophils Absolute: 0 10*3/uL (ref 0.0–0.7)
Eosinophils Relative: 1.4 % (ref 0.0–5.0)
HCT: 39 % (ref 36.0–46.0)
Hemoglobin: 12.6 g/dL (ref 12.0–15.0)
Lymphocytes Relative: 32.3 % (ref 12.0–46.0)
Lymphs Abs: 1 10*3/uL (ref 0.7–4.0)
MCHC: 32.2 g/dL (ref 30.0–36.0)
MCV: 89.7 fl (ref 78.0–100.0)
Monocytes Absolute: 0.4 10*3/uL (ref 0.1–1.0)
Monocytes Relative: 13.9 % — ABNORMAL HIGH (ref 3.0–12.0)
Neutro Abs: 1.6 10*3/uL (ref 1.4–7.7)
Neutrophils Relative %: 51.3 % (ref 43.0–77.0)
Platelets: 297 10*3/uL (ref 150.0–400.0)
RBC: 4.35 Mil/uL (ref 3.87–5.11)
RDW: 13.5 % (ref 11.5–15.5)
WBC: 3.2 10*3/uL — ABNORMAL LOW (ref 4.0–10.5)

## 2024-04-04 ENCOUNTER — Telehealth: Payer: Self-pay

## 2024-04-04 NOTE — Telephone Encounter (Signed)
 Copied from CRM [id number]. Topic: Clinical - Prescription Issue >> Apr 02, 2024 10:12 AM Juliaette Ober wrote: Reason for CRM: patient calling because her pharmacy needs dr Waylan Haggard to send them something sating he lowered patient dosage for mycophenolate  (CELLCEPT )

## 2024-04-09 ENCOUNTER — Telehealth: Payer: Self-pay | Admitting: *Deleted

## 2024-04-09 NOTE — Telephone Encounter (Signed)
 Copied from CRM [id number]. Topic: Clinical - Prescription Issue >> Apr 02, 2024 10:12 AM Juliaette Ober wrote: Reason for CRM: patient calling because her pharmacy needs dr Waylan Haggard to send them something sating he lowered patient dosage for mycophenolate  (CELLCEPT )   DUPLICATE

## 2024-05-07 ENCOUNTER — Other Ambulatory Visit: Payer: Self-pay

## 2024-05-09 ENCOUNTER — Other Ambulatory Visit: Payer: Self-pay

## 2024-05-09 ENCOUNTER — Telehealth: Payer: Self-pay

## 2024-05-09 NOTE — Telephone Encounter (Signed)
 Pharmacy Patient Advocate Encounter  Received notification from [facility] that Prior Authorization for VEOZAH  has been APPROVED from 05/09/2024 to 05/09/2025   PA #/Case ID/Reference #: [id number]

## 2024-06-17 ENCOUNTER — Ambulatory Visit: Attending: Internal Medicine | Admitting: Internal Medicine

## 2024-06-17 VITALS — BP 110/74 | HR 97 | Temp 98.4°F | Ht 67.0 in | Wt 215.0 lb

## 2024-06-17 DIAGNOSIS — J849 Interstitial pulmonary disease, unspecified: Secondary | ICD-10-CM

## 2024-06-17 DIAGNOSIS — K219 Gastro-esophageal reflux disease without esophagitis: Secondary | ICD-10-CM | POA: Diagnosis not present

## 2024-06-17 DIAGNOSIS — R1319 Other dysphagia: Secondary | ICD-10-CM

## 2024-06-17 DIAGNOSIS — M359 Systemic involvement of connective tissue, unspecified: Secondary | ICD-10-CM

## 2024-06-17 DIAGNOSIS — Z1211 Encounter for screening for malignant neoplasm of colon: Secondary | ICD-10-CM

## 2024-06-17 DIAGNOSIS — I10 Essential (primary) hypertension: Secondary | ICD-10-CM | POA: Diagnosis not present

## 2024-06-17 NOTE — Progress Notes (Signed)
 Patient ID: Chelsea Jennings, female    DOB: August 16, 1971  MRN: [id number]  CC: Hypertension (HTN f/u. Layvonne rx for milk of magnesium  to our pharm - Gaviscon Regular strength )   Subjective: Chelsea Jennings is a 53 y.o. female who presents for chronic ds management. Her concerns today include:  Patient with history of HTN, GERD, Fhx breast CA ([facility] Dr. 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 Followed by rheumatology at [facility] and pulmonary Dr. Theophilus), gastric sleeve   Discussed the use of AI scribe software for clinical note transcription with the patient, who gave verbal consent to proceed.  History of Present Illness Chelsea Jennings is a 53 year old female with hypertension, interstitial lung disease, and connective tissue disease who presents for a four-month follow-up visit.  HTN: Her blood pressure is well-controlled with amlodipine 5 mg daily, with home readings typically around 117/81 to 82 mmHg. She checks her blood pressure four times a week, especially on days she goes to the office, as it tends to be higher then. Her blood pressure today was 110/74 mmHg. She is limiting her salt intake.  Regarding her interstitial lung disease, her pulmonologist had decreased her CellCept  dosage, but the prescription was not updated in the pharmacy system, resulting in an excess supply. She is currently taking CellCept  500 mg twice a day, although the prescription is for three pills twice a day. She also takes Bactrim  three times a week and Pirfenidone  twice a day, which was also decreased from three times a day. She experiences occasional shortness of breath, which is relieved by using her Albuterol  inhaler.  For her connective tissue disease, she is managed by a rheumatologist at the [facility] and is currently on Plaquenil. She uses Tylenol and ibuprofen for pain management as needed.  She  experiences acid reflux symptoms despite taking Carafate  twice a day and pantoprazole  20 mg twice a day. She reports breakthrough symptoms at least once a week, characterized by pressure in her chest after eating like food gets stuck, which is relieved by vomiting. She has been using milk of magnesia for occasional relief, although it is not intended for acid reflux.    Patient Active Problem List   Diagnosis Date Noted   Connective tissue disease (HCC) 10/10/2023   Class 1 obesity with serious comorbidity and body mass index (BMI) of 34.0 to 34.9 in adult 10/10/2023   History of sleeve gastrectomy 10/10/2023   Mixed connective tissue disease (HCC) 09/12/2023   Essential hypertension 06/02/2023   Chronic cough 06/02/2023   GERD (gastroesophageal reflux disease) 10/26/2022   BARD1 gene mutation positive 12/29/2021   Genetic testing 12/16/2021   Family history of breast cancer 12/06/2021   COVID-19 virus infection 11/30/2021   Acute bronchitis 11/30/2021   ILD (interstitial lung disease) (HCC) 11/30/2021     Current Outpatient Medications on File Prior to Visit  Medication Sig Dispense Refill   albuterol  (VENTOLIN  HFA) 108 (90 Base) MCG/ACT inhaler INHALE 2 PUFFS EVERY 6 HOURS AS NEEDED FOR WHEEZE/SHORTNESS OF BREATH 6.7 each 2   amLODipine (NORVASC) 5 MG tablet Take 5 mg by mouth daily.     benzonatate  (TESSALON ) 200 MG capsule Take 1 capsule (200 mg total) by mouth 3 (three) times daily as needed for cough. 30 capsule 1   clobetasol ointment (TEMOVATE) 0.05 % Apply 1 application topically 2 (two) times daily.  dorzolamide-timolol (COSOPT) 22.3-6.8 MG/ML ophthalmic solution 1 drop 2 (two) times daily.     Fezolinetant  (VEOZAH ) 45 MG TABS Take 1 tablet (45 mg total) by mouth daily. 90 tablet 1   hydroxychloroquine (PLAQUENIL) 200 MG tablet Take 200 mg by mouth 2 (two) times daily.     latanoprost (XALATAN) 0.005 % ophthalmic solution Place 1 drop into both eyes at bedtime.      levocetirizine (XYZAL ) 5 MG tablet Take 1 tablet (5 mg total) by mouth every evening. 90 tablet 1   mycophenolate  (CELLCEPT ) 500 MG tablet TAKE 3 TABLETS BY MOUTH TWICE DAILY. 180 tablet 5   pantoprazole  (PROTONIX ) 20 MG tablet Take 20 mg by mouth 2 (two) times daily.     Pirfenidone  (ESBRIET ) 267 MG TABS Take 3 tablets (801 mg total) by mouth 3 (three) times daily with meals. Month 2 and onwards: take 3 tablets by mouth three times daily with meals 270 tablet 5   simethicone (MYLICON) 80 MG chewable tablet Chew 80 mg by mouth.     sucralfate  (CARAFATE ) 1 g tablet TAKE 1 TABLET THREE TIMES DAILY WITH MEALS AS NEEDED 270 tablet 1   sulfamethoxazole -trimethoprim  (BACTRIM  DS) 800-160 MG tablet Take 1 tablet by mouth 3 (three) times a week. 36 tablet 3   No current facility-administered medications on file prior to visit.    Allergies  Allergen Reactions   Nintedanib Diarrhea and Nausea Only    Dizziness, fatigue   Prilosec [Omeprazole ] Other (See Comments)    Alopecia   Famotidine Anxiety and Palpitations   Premarin [Estrogens Conjugated] Rash    Social History   Socioeconomic History   Marital status: Single    Spouse name: Not on file   Number of children: 0   Years of education: Not on file   Highest education level: Some college, no degree  Occupational History   Occupation: Retail banker: [employer]  Tobacco Use   Smoking status: Never   Smokeless tobacco: Never  Vaping Use   Vaping status: Never Used  Substance and Sexual Activity   Alcohol use: Not Currently   Drug use: Never   Sexual activity: Not on file  Other Topics Concern   Not on file  Social History Narrative   Not on file   Social Drivers of Health   Financial Resource Strain: Low Risk  (06/14/2024)   Overall Financial Resource Strain (CARDIA)    Difficulty of Paying Living Expenses: Not hard at all  Food Insecurity: No Food Insecurity (06/14/2024)   Hunger Vital Sign    Worried About  Running Out of Food in the Last Year: Never true    Ran Out of Food in the Last Year: Never true  Transportation Needs: No Transportation Needs (06/14/2024)   PRAPARE - Administrator, Civil Service (Medical): No    Lack of Transportation (Non-Medical): No  Physical Activity: Insufficiently Active (06/14/2024)   Exercise Vital Sign    Days of Exercise per Week: 5 days    Minutes of Exercise per Session: 20 min  Stress: Stress Concern Present (06/14/2024)   [employer] of Occupational Health - Occupational Stress Questionnaire    Feeling of Stress: To some extent  Social Connections: Socially Isolated (06/14/2024)   Social Connection and Isolation Panel    Frequency of Communication with Friends and Family: Three times a week    Frequency of Social Gatherings with Friends and Family: Once a week    Attends Religious  Services: Patient declined    Active Member of Clubs or Organizations: No    Attends Engineer, structural: Not on file    Marital Status: Divorced  Intimate Partner Violence: Not on file    Family History  Problem Relation Age of Onset   Breast cancer Mother        dx 64s; dx 38 (bilateral w/ mets)   Throat cancer Maternal Uncle    Breast cancer Paternal Aunt        dx 13s   Breast cancer Paternal Grandmother        dx before 61   Brain cancer Other        MGM's brothers x2   Lung cancer Other        MGM's brother    Past Surgical History:  Procedure Laterality Date   ABDOMINAL HYSTERECTOMY     LAPAROSCOPIC GASTRIC BAND REMOVAL WITH LAPAROSCOPIC GASTRIC SLEEVE RESECTION  04/2015    ROS: Review of Systems Negative except as stated above  PHYSICAL EXAM: BP 110/74 (BP Location: Left Arm, Patient Position: Sitting, Cuff Size: Normal)   Pulse 97   Temp 98.4 F (36.9 C) (Oral)   Ht 5' 7 (1.702 m)   Wt 215 lb (97.5 kg)   SpO2 97%   BMI 33.67 kg/m   Wt Readings from Last 3 Encounters:  06/17/24 215 lb (97.5 kg)  02/16/24 217 lb 3.2 oz  (98.5 kg)  12/12/23 213 lb 12.8 oz (97 kg)    Physical Exam  General appearance - alert, well appearing middle age AAF, and in no distress Mental status - normal mood, behavior, speech, dress, motor activity, and thought processes Chest - clear to auscultation, no wheezes, rales or rhonchi, symmetric air entry Heart - normal rate, regular rhythm, normal S1, S2, no murmurs, rubs, clicks or gallops Extremities - peripheral pulses normal, no pedal edema, no clubbing or cyanosis      Latest Ref Rng & Units 12/29/2023   10:34 AM 10/10/2023   11:53 AM 07/12/2023    3:54 PM  CMP  Glucose 70 - 99 mg/dL 891   70   BUN 6 - 23 mg/dL 9   9   Creatinine 9.59 - 1.20 mg/dL 9.23   9.18   Sodium 864 - 145 mEq/L 143   138   Potassium 3.5 - 5.1 mEq/L 4.9   4.0   Chloride 96 - 112 mEq/L 108   103   CO2 19 - 32 mEq/L 23   29   Calcium 8.4 - 10.5 mg/dL 9.3   9.6   Total Protein 6.0 - 8.3 g/dL 8.3  7.8  7.7   Total Bilirubin 0.2 - 1.2 mg/dL 0.4  0.4  0.3   Alkaline Phos 39 - 117 U/L 43  64  60   AST 0 - 37 U/L 26  13  17    ALT 0 - 35 U/L 9  14  11     Lipid Panel  No results found for: CHOL, TRIG, HDL, CHOLHDL, VLDL, LDLCALC, LDLDIRECT  CBC    Component Value Date/Time   WBC 3.2 (L) 03/01/2024 1132   RBC 4.35 03/01/2024 1132   HGB 12.6 03/01/2024 1132   HGB 12.4 10/10/2023 1153   HCT 39.0 03/01/2024 1132   HCT 39.6 10/10/2023 1153   PLT 297.0 03/01/2024 1132   PLT 341 10/10/2023 1153   MCV 89.7 03/01/2024 1132   MCV 93 10/10/2023 1153   MCH 29.0 10/10/2023 1153  MCHC 32.2 03/01/2024 1132   RDW 13.5 03/01/2024 1132   RDW 12.3 10/10/2023 1153   LYMPHSABS 1.0 03/01/2024 1132   MONOABS 0.4 03/01/2024 1132   EOSABS 0.0 03/01/2024 1132   BASOSABS 0.0 03/01/2024 1132    ASSESSMENT AND PLAN: 1. Essential hypertension (Primary) At goal.  Continue amlodipine 5 mg daily  2. ILD (interstitial lung disease) (HCC) Stable and followed by pulmonary.  Advised to call her  pulmonologist to let him know about the dosing on the CellCept  so that she does not continue to be send excess amounts.  3. Connective tissue disease (HCC) Stable on Plaquenil.  Followed by rheumatology at the [facility]  4. Gastroesophageal reflux disease without esophagitis GERD precautions discussed.  Advised to avoid certain foods like spicy foods, tomato-based foods, juices and excessive caffeine.  Advised to eat his last meal at least 2 to 3 hours before laying down at nights and to sleep with his head slightly elevated.   Continue pantoprazole  and Carafate .  Milk of magnesia was not prescribed  5. Esophageal dysphagia Differential diagnosis include esophageal dysmotility versus stricture - Ambulatory referral to Gastroenterology  6. Screening for colon cancer Discussed colon cancer screening methods.  She prefers Cologuard test. - Cologuard   Patient was given the opportunity to ask questions.  Patient verbalized understanding of the plan and was able to repeat key elements of the plan.   This documentation was completed using Paediatric nurse.  Any transcriptional errors are unintentional.  Orders Placed This Encounter  Procedures   Cologuard   Ambulatory referral to Gastroenterology     Requested Prescriptions    No prescriptions requested or ordered in this encounter    Return in about 4 months (around 10/17/2024).  Barnie Louder, MD, FACP

## 2024-06-17 NOTE — Patient Instructions (Signed)
 VISIT SUMMARY:  Today, you came in for your four-month follow-up visit. We reviewed your conditions, including hypertension, interstitial lung disease, connective tissue disease, and gastroesophageal reflux disease. Your blood pressure is well-controlled, and we discussed your current medications and any necessary adjustments. We also addressed your acid reflux symptoms and planned further evaluations for your esophageal dysphagia.  YOUR PLAN:  -HYPERTENSION: Hypertension means high blood pressure. Your blood pressure is well-controlled with your current medication, amlodipine 5 mg daily. Please continue taking this medication and limit your salt intake.  -INTERSTITIAL LUNG DISEASE: Interstitial lung disease is a group of disorders that cause scarring of the lungs. Your condition is stable with your current medications: CellCept , Bactrim , and pirfenidone . Please contact your pulmonologist to correct the CellCept  prescription and continue taking your medications as directed.  -CONNECTIVE TISSUE DISEASE: Connective tissue disease affects the tissues that support, bind, or separate other tissues and organs. You are managing this condition with Plaquenil, Tylenol, and ibuprofen as needed for pain. Please continue with these medications as prescribed.  -GASTROESOPHAGEAL REFLUX DISEASE (GERD): GERD is a condition where stomach acid frequently flows back into the tube connecting your mouth and stomach. You are experiencing breakthrough symptoms once a week. We will refer you to a gastroenterologist for further evaluation. In the meantime, continue taking pantoprazole  20 mg twice a day and Carafate  twice a day. Avoid known dietary triggers, eat your last meal 2-3 hours before lying down, and sleep with your head elevated.  -ESOPHAGEAL DYSPHAGIA: Esophageal dysphagia is difficulty swallowing due to problems in the esophagus. You have intermittent difficulty swallowing solids, causing chest pressure relieved by  vomiting. We will refer you to a gastroenterologist for further evaluation.  INSTRUCTIONS:  Please follow up with your pulmonologist to correct the CellCept  prescription. We will also refer you to a gastroenterologist for further evaluation of your GERD and esophageal dysphagia. Continue with your current medications and follow the lifestyle recommendations provided.

## 2024-07-04 ENCOUNTER — Ambulatory Visit: Payer: Self-pay | Admitting: Internal Medicine

## 2024-07-04 LAB — COLOGUARD: COLOGUARD: NEGATIVE

## 2024-07-12 ENCOUNTER — Encounter: Payer: Self-pay | Admitting: Physician Assistant

## 2024-08-01 ENCOUNTER — Ambulatory Visit: Admitting: Pulmonary Disease

## 2024-08-01 ENCOUNTER — Telehealth: Payer: Self-pay | Admitting: Pulmonary Disease

## 2024-08-01 NOTE — Telephone Encounter (Signed)
 Patient has increased cough and benzoate is not helping. She would like a call for suggestions.

## 2024-08-02 MED ORDER — AMOXICILLIN-POT CLAVULANATE 875-125 MG PO TABS: 1.0000 | ORAL_TABLET | Freq: Two times a day (BID) | ORAL | 0 refills | Status: DC

## 2024-08-02 MED ORDER — PROMETHAZINE-DM 6.25-15 MG/5ML PO SYRP: 5.0000 mL | ORAL_SOLUTION | Freq: Four times a day (QID) | ORAL | 0 refills | Status: AC | PRN

## 2024-08-02 MED ORDER — PREDNISONE 10 MG PO TABS: 40.0000 mg | ORAL_TABLET | Freq: Every day | ORAL | 0 refills | Status: AC

## 2024-08-02 NOTE — Telephone Encounter (Signed)
 Pt went to see PCP 9/15 for increased coughing, was told she had sinus infection and was prescribed antibiotic for x5 days. Pt has finished course and still not feeling well. Pt is using hydrocodone  cough syrup and seems to be helping at night. Pt stopped taking Tessalon  due to it not helping. Pt is using albuterol  inhaler. She would like stronger antibiotic and recommendations to help coughing.  Dr. Theophilus can you please advise

## 2024-08-02 NOTE — Telephone Encounter (Signed)
 I called and disused with patient. She already too cefdinir for 5 days There is no stronger cough medication than hydrocodone  cough syrup which she is already on I sent in a prescription for Augmentin  for 7 days for sinus infection, prednisone  40mg /day for 5 days and a prescription for Promethazine  DM to help with the cough. Nothing further needed

## 2024-08-09 ENCOUNTER — Ambulatory Visit (INDEPENDENT_AMBULATORY_CARE_PROVIDER_SITE_OTHER): Admitting: Pulmonary Disease

## 2024-08-09 ENCOUNTER — Encounter: Payer: Self-pay | Admitting: Pulmonary Disease

## 2024-08-09 VITALS — BP 122/82 | HR 89 | Temp 97.9°F | Ht 67.0 in | Wt 216.0 lb

## 2024-08-09 DIAGNOSIS — J84113 Idiopathic non-specific interstitial pneumonitis: Secondary | ICD-10-CM | POA: Diagnosis not present

## 2024-08-09 DIAGNOSIS — K219 Gastro-esophageal reflux disease without esophagitis: Secondary | ICD-10-CM

## 2024-08-09 DIAGNOSIS — R0600 Dyspnea, unspecified: Secondary | ICD-10-CM

## 2024-08-09 DIAGNOSIS — Z5181 Encounter for therapeutic drug level monitoring: Secondary | ICD-10-CM

## 2024-08-09 DIAGNOSIS — R062 Wheezing: Secondary | ICD-10-CM | POA: Diagnosis not present

## 2024-08-09 DIAGNOSIS — Z23 Encounter for immunization: Secondary | ICD-10-CM | POA: Diagnosis not present

## 2024-08-09 DIAGNOSIS — J849 Interstitial pulmonary disease, unspecified: Secondary | ICD-10-CM

## 2024-08-09 DIAGNOSIS — R053 Chronic cough: Secondary | ICD-10-CM | POA: Diagnosis not present

## 2024-08-09 LAB — COMPREHENSIVE METABOLIC PANEL WITH GFR
ALT: 16 U/L (ref 0–35)
AST: 15 U/L (ref 0–37)
Albumin: 4.3 g/dL (ref 3.5–5.2)
Alkaline Phosphatase: 41 U/L (ref 39–117)
BUN: 10 mg/dL (ref 6–23)
CO2: 29 meq/L (ref 19–32)
Calcium: 9.7 mg/dL (ref 8.4–10.5)
Chloride: 103 meq/L (ref 96–112)
Creatinine, Ser: 0.76 mg/dL (ref 0.40–1.20)
GFR: 89.63 mL/min (ref >60.00)
Glucose, Bld: 96 mg/dL (ref 70–99)
Potassium: 3.7 meq/L (ref 3.5–5.1)
Sodium: 140 meq/L (ref 135–145)
Total Bilirubin: 0.5 mg/dL (ref 0.2–1.2)
Total Protein: 8 g/dL (ref 6.0–8.3)

## 2024-08-09 LAB — CBC WITH DIFFERENTIAL/PLATELET
Basophils Absolute: 0 K/uL (ref 0.0–0.1)
Basophils Relative: 1.2 % (ref 0.0–3.0)
Eosinophils Absolute: 0 K/uL (ref 0.0–0.7)
Eosinophils Relative: 0.5 % (ref 0.0–5.0)
HCT: 39.4 % (ref 36.0–46.0)
Hemoglobin: 12.7 g/dL (ref 12.0–15.0)
Lymphocytes Relative: 22.7 % (ref 12.0–46.0)
Lymphs Abs: 0.9 K/uL (ref 0.7–4.0)
MCHC: 32.3 g/dL (ref 30.0–36.0)
MCV: 88.9 fl (ref 78.0–100.0)
Monocytes Absolute: 0.4 K/uL (ref 0.1–1.0)
Monocytes Relative: 9.2 % (ref 3.0–12.0)
Neutro Abs: 2.7 K/uL (ref 1.4–7.7)
Neutrophils Relative %: 66.4 % (ref 43.0–77.0)
Platelets: 337 K/uL (ref 150.0–400.0)
RBC: 4.43 Mil/uL (ref 3.87–5.11)
RDW: 13.6 % (ref 11.5–15.5)
WBC: 4 K/uL (ref 4.0–10.5)

## 2024-08-09 LAB — BRAIN NATRIURETIC PEPTIDE: Pro B Natriuretic peptide (BNP): 7 pg/mL (ref 0.0–100.0)

## 2024-08-09 MED ORDER — MYCOPHENOLATE MOFETIL 500 MG PO TABS: 500.0000 mg | ORAL_TABLET | Freq: Two times a day (BID) | ORAL | 5 refills | Status: DC

## 2024-08-09 MED ORDER — BUDESONIDE-FORMOTEROL FUMARATE 160-4.5 MCG/ACT IN AERO: 2.0000 | INHALATION_SPRAY | Freq: Two times a day (BID) | RESPIRATORY_TRACT | 6 refills | Status: AC

## 2024-08-09 MED ORDER — PREDNISONE 10 MG PO TABS: 60.0000 mg | ORAL_TABLET | Freq: Every day | ORAL | 0 refills | Status: DC

## 2024-08-09 NOTE — Patient Instructions (Signed)
  VISIT SUMMARY: During your visit, we discussed your worsening breathlessness, chronic cough, and wheezing. We reviewed your current medications and made some adjustments to better manage your symptoms.  YOUR PLAN: CONNECTIVE TISSUE DISEASE-RELATED INTERSTITIAL LUNG DISEASE (NSIP FIBROSIS): You have NSIP fibrosis, which recently worsened, possibly due to a viral infection. Your symptoms improved with prednisone  and antibiotics. -We will continue your current medications, Cellcept  500 mg twice a day and pirfenidone . -We will order a blood count to check your white blood cell levels. -We will order a chest CT to evaluate your interstitial lung disease. -We will order pulmonary function tests (PFTs). -We will adjust your Cellcept  prescription to the correct quantity. -We will prescribe another course of prednisone .  CHRONIC COUGH AND RECENT RESPIRATORY INFECTION: Your chronic cough has improved by 60% with prednisone . A recent respiratory infection, possibly viral, may have contributed to your symptoms. -We will prescribe another course of prednisone .  ASTHMA-LIKE SYMPTOMS (WHEEZING): You have been experiencing wheezing, possibly due to a recent infection causing asthma-like symptoms. -We will prescribe a regular inhaler with a steroid for you to use twice daily. -We will provide a spacer for your inhaler use.  GENERAL HEALTH MAINTENANCE: You need a flu vaccination [facility]. -Please get a flu vaccination.

## 2024-08-09 NOTE — Progress Notes (Signed)
 Chelsea Jennings    [id number]    10-30-1971  Primary Care Physician:Johnson, Barnie NOVAK, MD  Referring Physician: Vicci Barnie NOVAK, MD [address] [place] [address] [place],  [place] [address]   Chief complaint:  Follow-up for CTD related interstitial lung disease, NSIP On MMF and pirfenidone  since 2023  HPI: 53 y.o.  with history of hypertension, unspecified connective tissue disease, NSIP interstitial lung disease here to establish care She was previously followed by Dr.  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  She was put on prednisone  40 mg and then MMF which is titrated up to 1 g twice daily.  She was able to be weaned off prednisone  in December 2021 with improvement in symptoms of dyspnea. She also follows with Dr. Francena Cramp, rheumatology at the [facility] and is on Plaquenil. She completed pulmonary rehab in 2021  States that her dyspnea on exertion has been stable on CellCept .  She is also on Bactrim  for prophylaxis. Had echocardiogram in 2021 at the [facility] with no evidence of pulmonary hypertension  She also has history of GERD and was on omeprazole  but developed side effects of rash.  She is currently on pantoprazole  20 mg a day which appears to control her symptoms Postnasal drip was treated with over-the-counter antihistamines and nasal spray.  She was on chlorpheniramine which at least helped with her anxiety.  2023 She developed COVID-19 in January 2023 and treated as an outpatient with Paxlovid.  Subsequently she had treatment with Z-Pak and amoxicillin  for persistent symptoms.    Started nintedanib in July 2023.  This was poorly tolerated due to diarrhea and fatigue Started pirfenidone  in November 2023 which is better tolerated She is on PPI twice daily for cough  2025 MMF dose reduced to 500 mg twice daily due to leukopenia  Interim History: Discussed the use of AI scribe  software for clinical note transcription with the patient, who gave verbal consent to proceed.  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  No pets currently Occupation: Works for [employer].  Previously in the [employer] Exposures: Exposed to chemicals at [employer] camp in [place]  Smoking history: Never smoker Travel history: No significant recent travel Relevant family history:No significant family history of lung disease  Outpatient Encounter Medications as of 08/09/2024  Medication Sig   albuterol  (VENTOLIN  HFA) 108 (90 Base) MCG/ACT inhaler INHALE 2 PUFFS EVERY 6 HOURS AS NEEDED FOR WHEEZE/SHORTNESS OF BREATH   amLODipine (NORVASC) 5 MG tablet Take 5 mg by mouth daily.   amoxicillin -clavulanate (AUGMENTIN ) 875-125 MG tablet Take 1 tablet by mouth  2 (two) times daily.   benzonatate  (TESSALON ) 200 MG capsule Take 1 capsule (200 mg total) by mouth 3 (three) times daily as needed for cough.   clobetasol ointment (TEMOVATE) 0.05 % Apply 1 application topically 2 (two) times  daily.   dorzolamide-timolol (COSOPT) 22.3-6.8 MG/ML ophthalmic solution 1 drop 2 (two) times daily.   Fezolinetant  (VEOZAH ) 45 MG TABS Take 1 tablet (45 mg total) by mouth daily.   hydroxychloroquine (PLAQUENIL) 200 MG tablet Take 200 mg by mouth 2 (two) times daily.   levocetirizine (XYZAL ) 5 MG tablet Take 1 tablet (5 mg total) by mouth every evening.   lidocaine (XYLOCAINE) 5 % ointment Apply topically.   mycophenolate  (CELLCEPT ) 500 MG tablet TAKE 3 TABLETS BY MOUTH TWICE DAILY.   pantoprazole  (PROTONIX ) 20 MG tablet Take 20 mg by mouth 2 (two) times daily.   Pirfenidone  (ESBRIET ) 267 MG TABS Take 3 tablets (801 mg total) by mouth 3 (three) times daily with meals. Month 2 and onwards: take 3 tablets by mouth three times daily with meals   promethazine -dextromethorphan (PROMETHAZINE -DM) 6.25-15 MG/5ML syrup Take 5 mLs by mouth 4 (four) times daily as needed for cough.   simethicone (MYLICON) 80 MG chewable tablet Chew 80 mg by mouth.   sucralfate  (CARAFATE ) 1 g tablet TAKE 1 TABLET THREE TIMES DAILY WITH MEALS AS NEEDED   sulfamethoxazole -trimethoprim  (BACTRIM  DS) 800-160 MG tablet Take 1 tablet by mouth 3 (three) times a week.   latanoprost (XALATAN) 0.005 % ophthalmic solution Place 1 drop into both eyes at bedtime. (Patient not taking: Reported on 08/09/2024)   No facility-administered encounter medications on file as of 08/09/2024.    Vitals:   08/09/24 1114  BP: 122/82  Pulse: 89  Temp: 97.9 F (36.6 C)  Height: 5' 7 (1.702 m)  Weight: 216 lb (98 kg)  SpO2: 100%  TempSrc: Oral  BMI (Calculated): 33.82     Physical Exam GEN: No acute distress. CV: Regular rate and rhythm, no murmurs. LUNGS: Wheezing present, otherwise clear to auscultation bilaterally with normal respiratory effort. SKIN JOINTS: Warm and dry, no rash.     Data Reviewed: Reviewed data from the [facility] PFTs 2015-FVC 3.05 [79%], TLC 4.39 [80%], DLCO 65% 02/2020-FVC 2.47 [72%], TLC 3.39 [60%], DLCO 42%  CT  chest 03/2020-baseline ILD with subpleural sparing, groundglass opacities with traction bronchiectasis.  Cysts versus honeycombing at the base.  Worsening compared to 2019  Imaging: High-res CT 09/14/2021-probable UIP pattern interstitial pneumonia, soft tissue mass in the anterior mediastinum, low-attenuation lesion in the liver.  MRI abdomen 10/13/2021-benign hepatic hemangiomas in the liver  CT chest 05/11/2022-Mild progression of interstitial lung disease with suggestion of honeycombing.  CT high-resolution 12/01/2023-stable findings of interstitial lung disease in alternate pattern, likely NSIP I have reviewed the images personally.  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 Symptoms improved with prednisone  and antibiotics. Current treatment includes MMF and pirfenidone .  MMF dose was reduced in early January 2025 due to low WBC count. Need to assess if the reduction in  MMF has affected the interstitial lung disease. - Order blood count to assess WBC levels, check CMP, BNP for therapeutic monitoring - Order high-resolution chest CT to evaluate interstitial lung disease - Order pulmonary function tests (PFTs) - Continue MMF 500 mg twice a day - Continue pirfenidone   Chronic cough and recent respiratory infection Chronic cough improved by 60% with prednisone . Recent respiratory  infection suspected, possibly viral, coinciding with dental appointment. Cough syrup used minimally since prednisone  treatment. - Prescribe another course of prednisone .  Taper starting at 60 mg/day  Asthma-like symptoms (wheezing) Wheezing noted, possibly due to recent infection causing asthma-like symptoms. She reports using inhaler as needed. - Prescribe regular Symbicort inhaler with steroid for twice daily use - Provide spacer for inhaler use  General Health Maintenance Requires flu [facility].  GERD, postnasal drip Continue PPI twice daily, over-the-counter antihistamine  Liver lesions Noted on high-res CT Follow-up MRI shows benign hemangiomas  Follow-up - Schedule follow-up appointment in 3 months   Plan/Recommendations: Continue MMF, Bactrim , Plaquenil Continue pirfenidone  Labs for monitoring Pred taper Start Symbicort with spacer Follow-up high-res CT, PFTs  Lonna Coder MD [facility] 08/09/2024, 11:23 AM  CC: Vicci Barnie NOVAK, MD

## 2024-08-11 LAB — IGE: IgE (Immunoglobulin E), Serum: 4 kU/L (ref <=114)

## 2024-08-13 ENCOUNTER — Ambulatory Visit: Admitting: Pulmonary Disease

## 2024-08-13 ENCOUNTER — Telehealth: Payer: Self-pay

## 2024-08-13 MED ORDER — SPACER/AERO-HOLDING CHAMBERS DEVI: 1.0000 | 0 refills | Status: AC | PRN

## 2024-08-13 NOTE — Telephone Encounter (Signed)
 Copied from CRM [id number]. Topic: Clinical - Prescription Issue >> Aug 12, 2024  3:54 PM Isabell A wrote: Reason for CRM:  [facility] her [facility]er.   [id number] [address] [place], [place] - [address] [place] [address] Phone: [phone]  Fax: [phone]  Spacer sent to pharmacy.

## 2024-08-13 NOTE — Telephone Encounter (Signed)
  Rx was sent 08/09/24 by PM  Copied from CRM #[id number]. Topic: Clinical - Medication Prior Auth >> Aug 07, 2024  3:16 PM Devaughn RAMAN wrote: Reason for CRM: [facility] and stated she needs a new prescription for patient medication for mycophenolate  (CELLCEPT ) 500 MG tablet. It can be faxed to the information below.  [facility]  Fax- [phone] Phone-[phone]

## 2024-08-14 ENCOUNTER — Other Ambulatory Visit: Payer: Self-pay | Admitting: Pulmonary Disease

## 2024-08-15 ENCOUNTER — Telehealth: Payer: Self-pay

## 2024-08-15 DIAGNOSIS — J849 Interstitial pulmonary disease, unspecified: Secondary | ICD-10-CM

## 2024-08-15 MED ORDER — MYCOPHENOLATE MOFETIL 500 MG PO TABS: 500.0000 mg | ORAL_TABLET | Freq: Two times a day (BID) | ORAL | 2 refills | Status: AC

## 2024-08-15 NOTE — Telephone Encounter (Signed)
 Received prescription clarification request from [facility] regarding mycophenolate  mofetil.   Upon chart review, [facility] has prescription for incorrect quantity. Prescription for mycophenolate  mofetil 500mg  tab with SIG: Take 3 tablets by mouth 2 times a day.   Chart review shows dose was lowered due to leukopenia. Taking mycophenolate  mofetil 500mg  BID. Last OV 08/09/24 with instructions to continue taking 500mg  BID.   [facility] [place]   [facility] discontinued mycophenolate  mofetil prescription for the wrong quantity. Patient can receive Rx [facility] correct dose [facility] [place]. Called patient to inform of above. She reports per insurance, this medication has to be filled with [facility].   Will [facility] electronic [facility] for mycophenolate  mofetil 500mg  BID. Patient verbalizes understanding.   Aleck Puls, PharmD, BCPS, CPP Clinical Pharmacist  [facility]

## 2024-08-16 NOTE — Telephone Encounter (Signed)
 Copied from CRM [id number]. Topic: Clinical - Medication Question >> Aug 15, 2024 11:57 AM Benton O wrote: Reason for CRM: [facility]  this is for the prescription mycophenolate  (CELLCEPT ) 500 MG tablet received just want to clarify the written date for the prescription  [id number]  Any agent available will be able to assist  Our pharmacist caroline verified this information 10/6. Nfn

## 2024-08-20 ENCOUNTER — Other Ambulatory Visit: Payer: Self-pay | Admitting: Internal Medicine

## 2024-08-20 DIAGNOSIS — R232 Flushing: Secondary | ICD-10-CM

## 2024-08-23 ENCOUNTER — Ambulatory Visit
Admission: RE | Admit: 2024-08-23 | Discharge: 2024-08-23 | Disposition: A | Discharge status: Home / Self Care | Source: Ambulatory Visit | Attending: Pulmonary Disease | Admitting: Pulmonary Disease

## 2024-08-23 DIAGNOSIS — J849 Interstitial pulmonary disease, unspecified: Secondary | ICD-10-CM

## 2024-08-23 DIAGNOSIS — Z5181 Encounter for therapeutic drug level monitoring: Secondary | ICD-10-CM

## 2024-08-24 ENCOUNTER — Ambulatory Visit: Payer: Self-pay | Admitting: Pulmonary Disease

## 2024-09-02 NOTE — Progress Notes (Unsigned)
 09/03/2024 Chelsea Jennings [id number] 29-Nov-1970  Referring provider: Vicci Barnie NOVAK, MD Primary GI doctor: Dr. San  ASSESSMENT AND PLAN:  Esophageal dysphagia with liquids/food odynophagia/spasm/pain, will regurgitation, no oropharyngeal symptoms Protonix  40 mg BID gave anxiety, 20 mg BID on at this time, improved with carafate , now 1-2 x a week S/p gastric sleeve 2016 As needed prednisone  taper, on inhalers On MMF and pirfenidone  since 2023  Possible GERD dysmotility complicated by history of gastric sleeve/hiatal hernia repair, candidal esophagitis, esophagitis, connective tissue, etc - Order esophagogastroduodenoscopy (EGD) to evaluate esophageal structure and function.I discussed risks of EGD with patient today, including risk of sedation, bleeding or perforation.  Patient provides understanding and gave verbal consent to proceed. - Order barium swallow to assess esophageal motility with issues with liquids and solids. - Prescribe hyoscyamine (Levsin) sublingually as needed for esophageal spasm. - Continue pantoprazole  20 mg twice daily. - Add famotidine 20 mg at bedtime, monitor for anxiety or palpitations. - Provide samples of Reflux Gourmet for post-meal acid reflux prevention. - Obtain pulmonary clearance from Dr. Synthia prior to EGD.  Interstitial lung disease Follows with Dr. Theophilus, last seen 08/09/2024 PFT 09/2023 Spirometry suggests restrictive lung disease. Moderately decreased diffusion capacity.  Pending repeat PFT - Obtain pulmonary clearance from Dr. Synthia prior to EGD.  Fatty liver with hepatic hemangioma 10/13/2021 MR abdomen with and without contrast 3 benign hepatic hemangiomas largest 8.7 cm, hepatic iron deposits unremarkable gallbladder, pancreas, spleen, stomach and bowel 10/2023 MRI AB with and without at the [facility] Getting monitored for BRCA and pancreatic cancer risk    Latest Ref Rng & Units 08/09/2024   11:53 AM 12/29/2023   10:34 AM  10/10/2023   11:53 AM  Hepatic Function  Total Protein 6.0 - 8.3 g/dL 8.0  8.3  7.8   Albumin 3.5 - 5.2 g/dL 4.3  4.5  4.3   AST 0 - 37 U/L 15  26  13    ALT 0 - 35 U/L 16  9  14    Alk Phosphatase 39 - 117 U/L 41  43  64   Total Bilirubin 0.2 - 1.2 mg/dL 0.5  0.4  0.4   Bilirubin, Direct 0.00 - 0.40 mg/dL   9.85    Platelets 662.9  - need LFTs and CBC monitored every 6 months, - evaluation with imaging every 2-3 years.  - Encouraged diet/exercise for modest 10% body weight loss as treatment for hepatic steatosis -Continue to work on risk factor modification including diet exercise and control of risk factors including blood sugars.  Connective tissue On plaquenil, follows with rheumatology at [facility]  [facility]  06/28/2024 Cologuard negative  BRCA 1 gene   Patient Care Team: Chelsea Barnie NOVAK, MD as PCP - General (Internal Medicine)  HISTORY OF PRESENT ILLNESS: 53 y.o. female with a past medical history listed below presents for evaluation of dysphagia.   Discussed the use of AI scribe software for clinical note transcription with the patient, who gave verbal consent to proceed.  History of Present Illness   Chelsea Jennings is a 53 year old female with interstitial lung disease and BRCA1 gene mutation who presents with gastrointestinal symptoms including regurgitation and reflux.  She experiences episodes of regurgitation and reflux with both solid and liquid intake, characterized by a sensation of pressure or spasm in the chest, leading to burping and eventual vomiting, which provides relief. These episodes occur once or twice a week and have become less frequent since starting Claripay. No nausea, but  the episodes are painful and uncomfortable.  She has a history of gastric sleeve surgery in 2016, after which she experienced discomfort with eating for the first two years. Food and liquids sometimes cause severe gas and feel like they get stuck until she vomits. She has been on  pantoprazole  20 mg twice a day for reflux, which was previously increased to 40 mg twice a day but caused anxiety. She occasionally takes an extra dose if symptoms worsen.  She has interstitial lung disease and reports improved breathing since her last visit, although she still experiences a persistent cough despite completing a two-week course of prednisone . She is not on oxygen therapy.  She has a BRCA1 gene mutation and is undergoing pancreatic screening due to her family history of breast cancer. She denies alcohol and smoking and has no history of surgery or radiation in the chest area.  Her current medications include hydroxychloroquine for connective tissue disease, pantoprazole  20 mg twice a day, and Carafate  twice a day. She avoids using Aleve, ibuprofen, or cream powders.  During the review of symptoms, she denies heartburn but acknowledges reflux. She experiences pain during episodes of regurgitation but not outside of them.        She  reports that she has never smoked. She has never used smokeless tobacco. She reports that she does not currently use alcohol. She reports that she does not use drugs.  RELEVANT GI HISTORY, IMAGING AND LABS: Results   LABS Cologuard: Negative Genetic testing: Positive for BRCA1 gene mutation  RADIOLOGY Upper GI series: Small hiatal hernia, gastroesophageal reflux, gastric band device in expected location (2016)     10/2023 MRI AB  1. 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   Upper GI series and SB 03/2015 FINDINGS:  There is no obstruction to the antegrade flow of contrast from  the mouth to the small bowels. No evidence of esophageal  dilatation or stricturing. There is a small sliding hiatal hernia  at the distal esophagus. There is mild  gastroesophageal reflux.   There is a gastric band device at the expected location in the  region of the gastroesophageal junction. No frank slippage  identified. The visualized gastric folds are unremarkable. The  duodenum is of normal caliber and configuration.   There was quick transit time of contrast from the small bowels  into the large bowel. The terminal ileum is unremarkable. There  is normal fold pattern of the small bowels with normal caliber.  The appendix was unremarkable.   Impression: Small hiatal hernia with mild gastroesophageal reflux.  Gastric band device in the expected location at the  gastroesophageal region.   The terminal ileum and appendix were unremarkable.   EGD 09/2012 at [place], [place] Medications: Mod sed Impressions: - Z line at 40 cm and GE junctional appeared normal. - Normal entire esophagus. - On retroflexion surgical changes similar to those of Nissen Fundoplication  with near complete wrap like picture from prior lab band at the cardia/fundus. - Gastric body cavity appeared essentially normal except minimal diffuse antral  and gastric body erythema Random gastric bx taken to r/o H pylori. - Normal entire duodenum.  Recommendations: Continue current medications. F/u biopsy, r/o H.pylori. If positive, treat w/ triple therapy In addition to omeperazole add Ramitidine 150- mg BID as needed empirically. Cionsider opinion from  [place] [facility] surgeon is this was the intended anatomy  CBC    Component Value Date/Time   WBC 4.0 08/09/2024 1153   RBC 4.43 08/09/2024 1153   HGB 12.7 08/09/2024 1153   HGB 12.4 10/10/2023 1153   HCT 39.4 08/09/2024 1153   HCT 39.6 10/10/2023 1153   PLT 337.0 08/09/2024 1153   PLT 341 10/10/2023 1153   MCV 88.9 08/09/2024 1153   MCV 93 10/10/2023 1153   MCH 29.0 10/10/2023 1153   MCHC 32.3 08/09/2024 1153   RDW 13.6 08/09/2024 1153   RDW 12.3 10/10/2023 1153   LYMPHSABS 0.9 08/09/2024 1153   MONOABS 0.4  08/09/2024 1153   EOSABS 0.0 08/09/2024 1153   BASOSABS 0.0 08/09/2024 1153   Recent Labs    10/10/23 1153 12/29/23 1034 02/02/24 1105 03/01/24 1132 08/09/24 1153  HGB 12.4 13.3 13.1 12.6 12.7    CMP     Component Value Date/Time   NA 140 08/09/2024 1153   K 3.7 08/09/2024 1153   CL 103 08/09/2024 1153   CO2 29 08/09/2024 1153   GLUCOSE 96 08/09/2024 1153   BUN 10 08/09/2024 1153   CREATININE 0.76 08/09/2024 1153   CALCIUM 9.7 08/09/2024 1153   PROT 8.0 08/09/2024 1153   PROT 7.8 10/10/2023 1153   ALBUMIN 4.3 08/09/2024 1153   ALBUMIN 4.3 10/10/2023 1153   AST 15 08/09/2024 1153   ALT 16 08/09/2024 1153   ALKPHOS 41 08/09/2024 1153   BILITOT 0.5 08/09/2024 1153   BILITOT 0.4 10/10/2023 1153      Latest Ref Rng & Units 08/09/2024   11:53 AM 12/29/2023   10:34 AM 10/10/2023   11:53 AM  Hepatic Function  Total Protein 6.0 - 8.3 g/dL 8.0  8.3  7.8   Albumin 3.5 - 5.2 g/dL 4.3  4.5  4.3   AST 0 - 37 U/L 15  26  13    ALT 0 - 35 U/L 16  9  14    Alk Phosphatase 39 - 117 U/L 41  43  64   Total Bilirubin 0.2 - 1.2 mg/dL 0.5  0.4  0.4   Bilirubin, Direct 0.00 - 0.40 mg/dL   9.85       Current Medications:   Current Outpatient Medications (Endocrine & Metabolic):    Fezolinetant  (VEOZAH ) 45 MG TABS, TAKE 1 TABLET BY MOUTH EVERY DAY   predniSONE  (DELTASONE ) 10 MG tablet, Take 6 tablets (60 mg total) by mouth daily with breakfast. Take 60 mg/day on day 1 and reduce dose by 10 mg every 2 days until taper is complete  Current Outpatient Medications (Cardiovascular):    amLODipine (NORVASC) 5 MG tablet, Take 5 mg by mouth daily.  Current Outpatient Medications (Respiratory):    albuterol  (VENTOLIN  HFA) 108 (90 Base) MCG/ACT inhaler, INHALE 2 PUFFS EVERY 6 HOURS AS NEEDED FOR WHEEZE/SHORTNESS OF BREATH   benzonatate  (TESSALON ) 200 MG capsule, Take 1 capsule (200 mg total) by mouth 3 (three) times daily as needed for cough.   budesonide-formoterol (SYMBICORT) 160-4.5 MCG/ACT  inhaler, Inhale 2 puffs into the lungs in the morning and at bedtime.   levocetirizine (XYZAL ) 5 MG tablet, TAKE 1 TABLET BY MOUTH EVERY DAY IN THE EVENING   Pirfenidone  (ESBRIET ) 267 MG TABS, Take 3 tablets (801 mg total) by mouth 3 (three) times daily with meals. Month 2 and onwards: take 3 tablets by mouth three times daily with meals   promethazine -dextromethorphan (PROMETHAZINE -DM) 6.25-15 MG/5ML syrup, Take 5 mLs by mouth 4 (four)  times daily as needed for cough.    Current Outpatient Medications (Other):    amoxicillin -clavulanate (AUGMENTIN ) 875-125 MG tablet, Take 1 tablet by mouth 2 (two) times daily.   clobetasol ointment (TEMOVATE) 0.05 %, Apply 1 application topically 2 (two) times daily.   dorzolamide-timolol (COSOPT) 22.3-6.8 MG/ML ophthalmic solution, 1 drop 2 (two) times daily.   famotidine (PEPCID) 20 MG tablet, Take 1 tablet (20 mg total) by mouth at bedtime.   hydroxychloroquine (PLAQUENIL) 200 MG tablet, Take 200 mg by mouth 2 (two) times daily.   hyoscyamine (LEVSIN SL) 0.125 MG SL tablet, Place 1 tablet (0.125 mg total) under the tongue every 6 (six) hours as needed for cramping (esophageal spasm).   latanoprost (XALATAN) 0.005 % ophthalmic solution, Place 1 drop into both eyes at bedtime.   lidocaine (XYLOCAINE) 5 % ointment, Apply topically.   mycophenolate  (CELLCEPT ) 500 MG tablet, Take 1 tablet (500 mg total) by mouth 2 (two) times daily.   pantoprazole  (PROTONIX ) 20 MG tablet, Take 20 mg by mouth 2 (two) times daily.   SIMBRINZA 1-0.2 % SUSP, Apply 16 mLs to eye in the morning and at bedtime.   simethicone (MYLICON) 80 MG chewable tablet, Chew 80 mg by mouth.   Spacer/Aero-Holding Chambers DEVI, 1 each by Does not apply route as needed.   sucralfate  (CARAFATE ) 1 g tablet, TAKE 1 TABLET THREE TIMES DAILY WITH MEALS AS NEEDED   sulfamethoxazole -trimethoprim  (BACTRIM  DS) 800-160 MG tablet, TAKE 1 TABLET BY MOUTH THREE TIMES A WEEK  Medical History:  Past Medical  History:  Diagnosis Date   Asthma    BARD1 gene mutation positive 12/29/2021   Family history of breast cancer 12/06/2021   GERD (gastroesophageal reflux disease)    Glaucoma    Hypertension    Interstitial lung disease (HCC)    Obesity    UTI (urinary tract infection)    Allergies:  Allergies  Allergen Reactions   Nintedanib Diarrhea and Nausea Only    Dizziness, fatigue   Prilosec [Omeprazole ] Other (See Comments)    Alopecia   Famotidine Anxiety and Palpitations   Premarin [Estrogens Conjugated] Rash     Surgical History:  She  has a past surgical history that includes Abdominal hysterectomy and Laparoscopic gastric band removal with laparoscopic gastric sleeve resection (04/2015). Family History:  Her family history includes Brain cancer in an other family member; Breast cancer in her mother, paternal aunt, and paternal grandmother; Lung cancer in an other family member; Throat cancer in her maternal uncle.  REVIEW OF SYSTEMS  : All other systems reviewed and negative except where noted in the History of Present Illness.  PHYSICAL EXAM: BP 114/62 (BP Location: Left Arm, Patient Position: Sitting, Cuff Size: Normal)   Pulse 92   Ht 5' 7 (1.702 m)   Wt 216 lb 6 oz (98.1 kg)   BMI 33.89 kg/m  Physical Exam   GENERAL APPEARANCE: Well nourished, in no apparent distress. HEENT: No cervical lymphadenopathy, unremarkable thyroid, sclerae anicteric, conjunctiva pink. RESPIRATORY: Respiratory effort normal, breath sounds equal bilaterally without rales, rhonchi, or wheezing. CARDIO: Regular rate and rhythm with no murmurs, rubs, or gallops, peripheral pulses intact. ABDOMEN: Soft, non-distended, active bowel sounds in all four quadrants, localized discomfort and pain upon palpation, no rebound, no mass appreciated. RECTAL: Declines. MUSCULOSKELETAL: Full range of motion, normal gait, without edema. SKIN: Dry, intact without rashes or lesions. No jaundice. NEURO: Alert,  oriented, no focal deficits. PSYCH: Cooperative, normal mood and affect.  [name] JONELLE Coombs, PA-C 2:46 PM

## 2024-09-03 ENCOUNTER — Ambulatory Visit (INDEPENDENT_AMBULATORY_CARE_PROVIDER_SITE_OTHER): Admitting: Physician Assistant

## 2024-09-03 ENCOUNTER — Telehealth: Payer: Self-pay | Admitting: Physician Assistant

## 2024-09-03 ENCOUNTER — Encounter: Payer: Self-pay | Admitting: Physician Assistant

## 2024-09-03 VITALS — BP 114/62 | HR 92 | Ht 67.0 in | Wt 216.4 lb

## 2024-09-03 DIAGNOSIS — Z903 Acquired absence of stomach [part of]: Secondary | ICD-10-CM

## 2024-09-03 DIAGNOSIS — R1319 Other dysphagia: Secondary | ICD-10-CM

## 2024-09-03 DIAGNOSIS — J849 Interstitial pulmonary disease, unspecified: Secondary | ICD-10-CM | POA: Diagnosis not present

## 2024-09-03 DIAGNOSIS — K219 Gastro-esophageal reflux disease without esophagitis: Secondary | ICD-10-CM

## 2024-09-03 DIAGNOSIS — M359 Systemic involvement of connective tissue, unspecified: Secondary | ICD-10-CM

## 2024-09-03 DIAGNOSIS — K760 Fatty (change of) liver, not elsewhere classified: Secondary | ICD-10-CM | POA: Diagnosis not present

## 2024-09-03 DIAGNOSIS — R053 Chronic cough: Secondary | ICD-10-CM

## 2024-09-03 DIAGNOSIS — Z15068 Genetic susceptibility to other malignant neoplasm of digestive system: Secondary | ICD-10-CM

## 2024-09-03 MED ORDER — FAMOTIDINE 20 MG PO TABS: 20.0000 mg | ORAL_TABLET | Freq: Every day | ORAL | 0 refills | Status: DC

## 2024-09-03 MED ORDER — HYOSCYAMINE SULFATE 0.125 MG SL SUBL
0.1250 mg | SUBLINGUAL_TABLET | Freq: Four times a day (QID) | SUBLINGUAL | 1 refills | Status: AC | PRN
Start: 2024-09-03 — End: ?

## 2024-09-03 NOTE — Telephone Encounter (Signed)
 Inbound call from patient stating that she called the pharmacy to see if they had received her prescriptions. She states that they did not receive  Levsin and is requesting we send prescription again, please advise.

## 2024-09-03 NOTE — Patient Instructions (Addendum)
 _______________________________________________________  If your blood pressure at your visit was 140/90 or greater, please contact your primary care physician to follow up on this.  _______________________________________________________  If you are age 53 or older, your body mass index should be between 23-30. Your Body mass index is 33.89 kg/m. If this is out of the aforementioned range listed, please consider follow up with your Primary Care Provider.  If you are age 54 or younger, your body mass index should be between 19-25. Your Body mass index is 33.89 kg/m. If this is out of the aformentioned range listed, please consider follow up with your Primary Care Provider.   ________________________________________________________  The [facility] providers would like to encourage you to use MYCHART to communicate with providers for non-urgent requests or questions.  Due to long hold times on the telephone, sending your provider a message by [facility] may be a faster and more efficient way to get a response.  Please allow 48 business hours for a response.  Please remember that this is for non-urgent requests.  _______________________________________________________  [facility] is using a team-based approach to care.  Your team is made up of your doctor and two to three APPS. Our APPS (Nurse Practitioners and Physician Assistants) work with your physician to ensure care continuity for you. They are fully qualified to address your health concerns and develop a treatment plan. They communicate directly with your gastroenterologist to care for you. Seeing the Advanced Practice Practitioners on your physician's team can help you by facilitating care more promptly, often allowing for earlier appointments, access to diagnostic testing, procedures, and other specialty referrals.   You have been scheduled for a Barium Esophogram at [facility] (1st floor of the hospital) on 09-20-24 at  11am. Please arrive 30 minutes prior to your appointment for registration. Make certain not to have anything to eat or drink 3 hours prior to your test. If you need to reschedule for any reason, please contact radiology at [phone] to do so. __________________________________________________________________ A barium swallow is an examination that concentrates on views of the esophagus. This tends to be a double contrast exam (barium and two liquids which, when combined, create a gas to distend the wall of the oesophagus) or single contrast (non-ionic iodine based). The study is usually tailored to your symptoms so a good history is essential. Attention is paid during the study to the form, structure and configuration of the esophagus, looking for functional disorders (such as aspiration, dysphagia, achalasia, motility and reflux) EXAMINATION You may be asked to change into a gown, depending on the type of swallow being performed. A radiologist and radiographer will perform the procedure. The radiologist will advise you of the type of contrast selected for your procedure and direct you during the exam. You will be asked to stand, sit or lie in several different positions and to hold a small amount of fluid in your mouth before being asked to swallow while the imaging is performed .In some instances you may be asked to swallow barium coated marshmallows to assess the motility of a solid food bolus. The exam can be recorded as a digital or video fluoroscopy procedure. POST PROCEDURE It will take 1-2 days for the barium to pass through your system. To facilitate this, it is important, unless otherwise directed, to increase your fluids for the next 24-48hrs and to resume your normal diet.  This test typically takes about 30 minutes to perform. __________________________________________________________________________________  Rosine have been scheduled for an endoscopy. Please  follow written instructions  given to you at your visit today.  If you use inhalers (even only as needed), please bring them with you on the day of your procedure.  If you take any of the following medications, they will need to be adjusted prior to your procedure:   DO NOT TAKE 7 DAYS PRIOR TO TEST- Trulicity (dulaglutide) Ozempic, Wegovy (semaglutide) Mounjaro (tirzepatide) Bydureon Bcise (exanatide extended release)  DO NOT TAKE 1 DAY PRIOR TO YOUR TEST Rybelsus (semaglutide) Adlyxin (lixisenatide) Victoza (liraglutide) Byetta (exanatide) ___________________________________________________________________________   Please take your proton pump inhibitor medication, pantoprazole  20 mg twice a day, carafate  1 gram twice a day, can go up to 4 x a day. Add on pepcid 40 mg at night  Please take this medication 30 minutes to 1 hour before meals- this makes it more effective.  Avoid spicy and acidic foods Avoid fatty foods Limit your intake of coffee, tea, alcohol, and carbonated drinks Work to maintain a healthy weight Keep the head of the bed elevated at least 3 inches with blocks or a wedge pillow if you are having any nighttime symptoms Stay upright for 2 hours after eating Avoid meals and snacks three to four hours before bedtime  Reflux Gourmet Rescue  It is an ALGINATE THERAPY which is the only intervention that works to safeguard the esophagus by creating a protective barrier that actually stops reflux from happening. -The general directions for use are as stated on the packaging: Take 1 teaspoon (5 ml), or more as needed or as directed by your physician, after meals and before bed. -These general directions address the most common times for reflux to occur, but our Rescue products may be taken anytime. Some individuals may take our product preemptively, when they know they will suffer from reflux, or as needed - when discomfort arises. (If taken around food, it should be consumed last.) -You do not have  to take 1 teaspoon (5 ml) of the product. While one teaspoon (5ml) may be the perfect average amount to relieve reflux suffering in some, others may require more or less. You may adjust the amount of Mint Chocolate Rescue and Vanilla Caramel Rescue to the lowest amount necessary to meet your individual needs to improve your quality of life. -You may dilute the product if it is too viscous for you to consume. Keep in mind, however, that the thickness of the product was formulated to provide optimal coating and protection of your throat and esophagus. Though diluting the product is possible, it may reduce the protective function and/or length of action. -This can be used in conjunction with reflux medications and lifestyle changes.  100% ALL-NATURAL  Paraben FREE, glycerin FREE, & potassium FREE  Made entirely from all-natural ingredients considered safe for children and during pregnancy  No known side effects  All-natural flavor Gluten FREE  Allergen FREE  Vegan  Can find more information here: nameseizer.co.nz   Behavioral and Dietary Strategies for Management of Esophageal Dysmotility/dysphagia 1. Take reflux medications 30+ minutes before food in the morning.  2. Begin meals with warm beverage 3. Eat smaller more frequent meals 4. Eat slowly, taking small bites and sips 5. Alternate solids and liquids 6. Avoid foods/liquids that increase acid production 7. Sit upright during and for 30+ minutes after meals to facilitate esophageal clearing 8. Can try altoid melting in mouth before food

## 2024-09-03 NOTE — Telephone Encounter (Signed)
 Patient said that Pepcid is ready and they are working on the Levsin. Patient aware

## 2024-09-05 ENCOUNTER — Telehealth: Payer: Self-pay | Admitting: Pulmonary Disease

## 2024-09-05 NOTE — Telephone Encounter (Signed)
 Fax received from [facility] to perform a endoscopic procedure on patient.  Patient needs surgery clearance. Surgery is 10/09/24. Patient was seen on 08/09/24. Office protocol is a risk assessment can be sent to surgeon if patient has been seen in 60 days or less.   Sending to Dr Theophilus for risk assessment or recommendations if patient needs to be seen in office prior to surgical procedure.

## 2024-09-11 ENCOUNTER — Encounter: Payer: Self-pay | Admitting: Pulmonary Disease

## 2024-09-11 NOTE — Progress Notes (Signed)
 Agree with the assessment and plan as outlined by Quentin Mulling, PA-C. ? ?Keron Neenan, DO, FACG ? ?

## 2024-09-12 NOTE — Telephone Encounter (Signed)
 Dr. Theophilus, Please see message from patient and advise regarding cough.  Thank you.

## 2024-09-17 ENCOUNTER — Encounter: Payer: Self-pay | Admitting: Pulmonary Disease

## 2024-09-17 MED ORDER — NYSTATIN 100000 UNIT/ML MT SUSP: 5.0000 mL | Freq: Three times a day (TID) | OROMUCOSAL | 2 refills | Status: AC | PRN

## 2024-09-17 NOTE — Telephone Encounter (Signed)
 Preop assessment completed in a separate note

## 2024-09-17 NOTE — Telephone Encounter (Signed)
 It may be thrush from the Symbicort.  Make sure that you rinse your mouth after every use.  If there is persistent issue then you use Magic mouthwash to help clear the thrush and I have sent in a prescription.  We can reduce the dose of Symbicort as needed 1 puff twice daily to help reduce incidence of thrush.

## 2024-09-17 NOTE — Telephone Encounter (Signed)
 Dr Theophilus, GI clinic is calling to check status of risk assessment. Please advise, thanks!

## 2024-09-17 NOTE — Progress Notes (Signed)
 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  Last CT scan showed stability of her ILD.  She is not on supplemental oxygen.  By BUCKY criteria patient has   Low risk 1.6% risk of in-hospital post-op pulmonary complications (composite including respiratory failure, respiratory infection, pleural effusion, atelectasis, pneumothorax, bronchospasm, aspiration pneumonitis)  [name], risk of Chelsea pulmonary complications is increased by:  Interstitial lung disease   Respiratory complications generally occur in 1% of ASA Class I patients, 5% of ASA Class II and 10% of ASA Class III-IV patients These complications rarely result in mortality and iclude postoperative pneumonia, atelectasis, pulmonary embolism, ARDS and increased time requiring postoperative mechanical ventilation.  Overall, I recommend proceeding with the surgery if the risk for respiratory complications are outweighed by the potential benefits. This will need to be discussed between the patient and surgeon.  To reduce risks of respiratory complications, I recommend: --Pre- and post-operative incentive spirometry performed frequently while awake --Avoiding use of pancuronium during anesthesia.  Chelsea Selvy MD [facility] See Amion for pager  If no response to pager , please call [phone] until 7pm After 7:00 pm call Elink  [phone] 09/17/2024, 4:58 PM

## 2024-09-19 NOTE — Telephone Encounter (Signed)
Patient canceled procedure.

## 2024-09-20 ENCOUNTER — Ambulatory Visit (HOSPITAL_COMMUNITY)
Admission: RE | Admit: 2024-09-20 | Discharge: 2024-09-20 | Disposition: A | Discharge status: Home / Self Care | Source: Ambulatory Visit | Attending: Physician Assistant | Admitting: Physician Assistant

## 2024-09-20 DIAGNOSIS — R1319 Other dysphagia: Secondary | ICD-10-CM | POA: Diagnosis present

## 2024-09-20 NOTE — Telephone Encounter (Signed)
 [facility]. Requesting to know if she will follow up visit after procedure. Please advise, thank you

## 2024-09-20 NOTE — Telephone Encounter (Signed)
**Note De-identified  [place] Obfuscation** Please advise 

## 2024-09-20 NOTE — Telephone Encounter (Signed)
 She can follow up with us  if needed.  [name]

## 2024-09-20 NOTE — Telephone Encounter (Signed)
 Risk assessment faxed to [facility]

## 2024-09-23 ENCOUNTER — Ambulatory Visit: Payer: Self-pay | Admitting: Physician Assistant

## 2024-09-23 NOTE — Telephone Encounter (Signed)
 Patient said that the office visit was filled through the [facility] and that is why they are now doing her procedure and she said she wants to know her results. I told her that the [facility] should follow up with her regarding her procedures because that is what we do and they should do the same. She said that we should have access to the [facility] since her pulmonary does and I told her from experience it has been a pain to get records from them so they would need to send the report to them but she needs to talk to billing about this issue because since she has 2 insurance they will run office visit etc through both insurances but the [facility] should've been first they would fill and she said ok

## 2024-09-26 ENCOUNTER — Other Ambulatory Visit: Payer: Self-pay | Admitting: Physician Assistant

## 2024-10-09 ENCOUNTER — Encounter: Admitting: Gastroenterology

## 2024-10-17 ENCOUNTER — Ambulatory Visit: Attending: Internal Medicine | Admitting: Internal Medicine

## 2024-10-17 ENCOUNTER — Encounter: Payer: Self-pay | Admitting: Internal Medicine

## 2024-10-17 VITALS — BP 124/72 | HR 87 | Temp 97.7°F | Ht 67.0 in | Wt 214.6 lb

## 2024-10-17 DIAGNOSIS — M7918 Myalgia, other site: Secondary | ICD-10-CM

## 2024-10-17 DIAGNOSIS — K219 Gastro-esophageal reflux disease without esophagitis: Secondary | ICD-10-CM

## 2024-10-17 DIAGNOSIS — J849 Interstitial pulmonary disease, unspecified: Secondary | ICD-10-CM

## 2024-10-17 DIAGNOSIS — I10 Essential (primary) hypertension: Secondary | ICD-10-CM | POA: Diagnosis not present

## 2024-10-17 DIAGNOSIS — R1319 Other dysphagia: Secondary | ICD-10-CM

## 2024-10-17 DIAGNOSIS — E66811 Obesity, class 1: Secondary | ICD-10-CM

## 2024-10-17 MED ORDER — CYCLOBENZAPRINE HCL 5 MG PO TABS: 5.0000 mg | ORAL_TABLET | Freq: Two times a day (BID) | ORAL | 0 refills | Status: AC | PRN

## 2024-10-17 NOTE — Patient Instructions (Signed)
°  VISIT SUMMARY: Today, we discussed your acute left shoulder pain, which started after drying your leg post-shower. We also reviewed your ongoing conditions, including hypertension, gastroesophageal reflux disease, and interstitial lung disease. Your current medications and their effectiveness were evaluated, and new recommendations were made to help manage your symptoms.  YOUR PLAN: -ACUTE MYOFASCIAL PAIN OF THE LEFT SHOULDER AND UPPER ARM: This type of pain is likely due to muscle strain. We have prescribed cyclobenzaprine 5 mg to be taken as needed for pain relief. Additionally, you should use Icy Hot rub 2-3 times daily and apply a heating pad for 15 minutes 2-3 times a day to help relieve tension.  -GASTROESOPHAGEAL REFLUX DISEASE WITH ESOPHAGEAL DYSPHAGIA: This condition involves severe acid reflux and difficulty swallowing. Continue taking pantoprazole  20 mg twice daily. You have an endoscopy scheduled at the [facility] next Tuesday, and we will review the report once it is available.  -INTERSTITIAL LUNG DISEASE DUE TO CONNECTIVE TISSUE DISEASE: This is a chronic lung condition managed with medications. Continue taking CellCept  500 mg twice daily, Bactrim  three times a week, and pirfenidone  twice daily as prescribed.  -ESSENTIAL HYPERTENSION: Your blood pressure is well-controlled with your current medication. Continue taking amlodipine 5 mg daily and monitor your blood pressure at home intermittently.  -GENERAL HEALTH MAINTENANCE: You are up to date with your flu and COVID vaccinations. However, you are due for a shingles vaccination, which we will administer today.  INSTRUCTIONS: Please follow up with the endoscopy at the [facility] next Tuesday and ensure we receive the report for review. Continue monitoring your blood pressure at home and report any significant changes. If your shoulder pain persists or worsens, please contact our office.                      Contains text generated  by Abridge.                                 Contains text generated by Abridge.

## 2024-10-17 NOTE — Progress Notes (Addendum)
 Patient ID: Chelsea Jennings, female    DOB: 1970-11-12  MRN: [id number]  CC: Hypertension (HTN f/u. Wyn received flu vax.) and Shoulder Pain (Pain on left shoulder X4 days )   Subjective: Chelsea Jennings is a 53 y.o. female who presents for chronic ds management. Her concerns today include:  Patient with history of HTN, GERD, Fhx breast CA ([facility] 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 Followed [facility]ology [facility] and pulmonary Dr. Theophilus), gastric sleeve   Discussed the use of AI scribe software for clinical note transcription with the patient, who gave verbal consent to proceed.  History of Present Illness Chelsea Jennings is a 53 year old female with hypertension and gastroesophageal reflux disease who presents with left shoulder pain.  She has been experiencing acute left shoulder pain since Monday, which began after leaning forward to dry her leg post-shower. The pain initially spread across her back and then localized to the left shoulder blade. It is described as sharp and radiates into the tricep muscles, exacerbated by leaning forward or backward, but not significantly affected by shoulder movement. She has been using a lidocaine patch daily and taking Motrin, but the pain persists. No numbness or tingling is associated with the shoulder pain.  HTN: Her hypertension is managed with amlodipine 5 mg daily, with home blood pressure readings mostly below 130/83 mmHg, except for a higher reading during a pain episode. No new chest pain or shortness of breath beyond her usual symptoms.  She has a history of gastroesophageal reflux disease with dysphagia. Referred to GI and noted to have severe spontaneous reflux up to the thoracic inlet, as shown on a barium swallow. She is on pantoprazole  20 mg twice daily and famotidine  20 mg twice daily was added, which helps reduce  breakthrough symptoms but does not completely alleviate them. She experiences painful swallowing and occasional vomiting. An endoscopy scheduled at the [facility] next Tuesday.  ILD: Follows with Dr. Theophilus. She is managing interstitial lung disease with medications including CellCept  500 mg twice daily, Bactrim  three times a week, and pirfenidone  twice daily. and had a short course of prednisone  for a recent exacerbation of her cough, which has since been discontinued along with a short-term antibiotic.  For her connective tissue disease, she takes Plaquenil     Patient Active Problem List   Diagnosis Date Noted   Connective tissue disease 10/10/2023   Class 1 obesity with serious comorbidity and body mass index (BMI) of 34.0 to 34.9 in adult 10/10/2023   History of sleeve gastrectomy 10/10/2023   Mixed connective tissue disease 09/12/2023   Essential hypertension 06/02/2023   Chronic cough 06/02/2023   GERD (gastroesophageal reflux disease) 10/26/2022   BARD1 gene mutation positive 12/29/2021   Genetic testing 12/16/2021   Family history of breast cancer 12/06/2021   COVID-19 virus infection 11/30/2021   Acute bronchitis 11/30/2021   ILD (interstitial lung disease) (HCC) 11/30/2021     Medications Ordered Prior to Encounter[1]  Allergies[2]  Social History   Socioeconomic History   Marital status: Single    Spouse name: Not on file   Number of children: 0   Years of education: Not on file   Highest education level: Associate degree: occupational, scientist, product/process development, or vocational program  Occupational History   Occupation: Retail Banker: [employer]  Tobacco Use   Smoking status: Never  Smokeless tobacco: Never  Vaping Use   Vaping status: Never Used  Substance and Sexual Activity   Alcohol use: Not Currently   Drug use: Never   Sexual activity: Not on file  Other Topics Concern   Not on file  Social History Narrative   Not on file   Social Drivers of Health    Tobacco Use: Low Risk (10/17/2024)   Patient History    Smoking Tobacco Use: Never    Smokeless Tobacco Use: Never    Passive Exposure: Not on file  Financial Resource Strain: Low Risk (10/15/2024)   Overall Financial Resource Strain (CARDIA)    Difficulty of Paying Living Expenses: Not hard at all  Food Insecurity: No Food Insecurity (10/15/2024)   Epic    Worried About Programme Researcher, Broadcasting/film/video in the Last Year: Never true    Ran Out of Food in the Last Year: Never true  Transportation Needs: No Transportation Needs (10/15/2024)   Epic    Lack of Transportation (Medical): No    Lack of Transportation (Non-Medical): No  Physical Activity: Unknown (10/15/2024)   Exercise Vital Sign    Days of Exercise per Week: Patient declined    Minutes of Exercise per Session: Not on file  Stress: Stress Concern Present (10/15/2024)   [employer] of Occupational Health - Occupational Stress Questionnaire    Feeling of Stress: To some extent  Social Connections: Socially Isolated (10/15/2024)   Social Connection and Isolation Panel    Frequency of Communication with Friends and Family: More than three times a week    Frequency of Social Gatherings with Friends and Family: More than three times a week    Attends Religious Services: Patient declined    Active Member of Clubs or Organizations: No    Attends Engineer, Structural: Not on file    Marital Status: Divorced  Intimate Partner Violence: Not on file  Depression (PHQ2-9): Low Risk (06/17/2024)   Depression (PHQ2-9)    PHQ-2 Score: 4  Alcohol Screen: Low Risk (10/15/2024)   Alcohol Screen    Last Alcohol Screening Score (AUDIT): 1  Housing: Low Risk (10/15/2024)   Epic    Unable to Pay for Housing in the Last Year: No    Number of Times Moved in the Last Year: 0    Homeless in the Last Year: No  Utilities: Not on file  Health Literacy: Not on file    Family History  Problem Relation Age of Onset   Breast cancer Mother         dx 19s; dx 19 (bilateral w/ mets)   Throat cancer Maternal Uncle    Breast cancer Paternal Aunt        dx 31s   Breast cancer Paternal Grandmother        dx before 39   Brain cancer Other        MGM's brothers x2   Lung cancer Other        MGM's brother    Past Surgical History:  Procedure Laterality Date   ABDOMINAL HYSTERECTOMY     LAPAROSCOPIC GASTRIC BAND REMOVAL WITH LAPAROSCOPIC GASTRIC SLEEVE RESECTION  04/2015    ROS: Review of Systems Negative except as stated above  PHYSICAL EXAM: BP 124/72   Pulse 87   Temp 97.7 F (36.5 C) (Oral)   Ht 5' 7 (1.702 m)   Wt 214 lb 9.6 oz (97.3 kg)   SpO2 96%   BMI 33.61 kg/m   Physical  Exam  General appearance - alert, well appearing,middle age AAF  and in no distress Mental status - normal mood, behavior, speech, dress, motor activity, and thought processes Chest - clear to auscultation, no wheezes, rales or rhonchi, symmetric air entry. Heart - normal rate, regular rhythm, normal S1, S2, no murmurs, rubs, clicks or gallops Musculoskeletal - mild moderate tenderness on palpation over medial aspect LT shoulder blade. Godd ROM LT shoulder jt without discomfort      Latest Ref Rng & Units 08/09/2024   11:53 AM 12/29/2023   10:34 AM 10/10/2023   11:53 AM  CMP  Glucose 70 - 99 mg/dL 96  891    BUN 6 - 23 mg/dL 10  9    Creatinine 9.59 - 1.20 mg/dL 9.23  9.23    Sodium 864 - 145 mEq/L 140  143    Potassium 3.5 - 5.1 mEq/L 3.7  4.9    Chloride 96 - 112 mEq/L 103  108    CO2 19 - 32 mEq/L 29  23    Calcium 8.4 - 10.5 mg/dL 9.7  9.3    Total Protein 6.0 - 8.3 g/dL 8.0  8.3  7.8   Total Bilirubin 0.2 - 1.2 mg/dL 0.5  0.4  0.4   Alkaline Phos 39 - 117 U/L 41  43  64   AST 0 - 37 U/L 15  26  13    ALT 0 - 35 U/L 16  9  14     Lipid Panel  No results found for: CHOL, TRIG, HDL, CHOLHDL, VLDL, LDLCALC, LDLDIRECT  CBC    Component Value Date/Time   WBC 4.0 08/09/2024 1153   RBC 4.43 08/09/2024 1153   HGB 12.7  08/09/2024 1153   HGB 12.4 10/10/2023 1153   HCT 39.4 08/09/2024 1153   HCT 39.6 10/10/2023 1153   PLT 337.0 08/09/2024 1153   PLT 341 10/10/2023 1153   MCV 88.9 08/09/2024 1153   MCV 93 10/10/2023 1153   MCH 29.0 10/10/2023 1153   MCHC 32.3 08/09/2024 1153   RDW 13.6 08/09/2024 1153   RDW 12.3 10/10/2023 1153   LYMPHSABS 0.9 08/09/2024 1153   MONOABS 0.4 08/09/2024 1153   EOSABS 0.0 08/09/2024 1153   BASOSABS 0.0 08/09/2024 1153    ASSESSMENT AND PLAN: 1. Acute myofascial pain (Primary) I suspect myofascial pain. Advised to use some IcyHot rub over-the-counter 2-3 times a day. She is willing to try Flexeril  as needed.  Advised that it can cause some drowsiness. Use a heating pad as needed. - cyclobenzaprine  (FLEXERIL ) 5 MG tablet; Take 1 tablet (5 mg total) by mouth 2 (two) times daily as needed for muscle spasms.  Dispense: 15 tablet; Refill: 0  2. Essential hypertension At goal.  Continue amlodipine 5 mg daily  3. Gastroesophageal reflux disease without esophagitis 4. Esophageal dysphagia Her reflux appears to be quite severe not just based on her symptoms but also from esophagram.  She will continue pantoprazole  twice a day and famotidine  20 mg twice a day.  Keep upcoming appointment [facility]  5. ILD (interstitial lung disease) (HCC) Stable and followed by pulmonary.  Continue medications as listed above.     Patient was given the opportunity to ask questions.  Patient verbalized understanding of the plan and was able to repeat key elements of the plan.   This documentation was completed using Paediatric nurse.  Any transcriptional errors are unintentional.  No orders of the defined types  were placed in this encounter.    Requested Prescriptions   Signed Prescriptions Disp Refills   cyclobenzaprine  (FLEXERIL ) 5 MG tablet 15 tablet 0    Sig: Take 1 tablet (5 mg total) by mouth 2 (two) times daily as needed for muscle  spasms.    Return in about 4 months (around 02/15/2025).  Barnie Louder, MD, FACP     [1]  Current Outpatient Medications on File Prior to Visit  Medication Sig Dispense Refill   albuterol  (VENTOLIN  HFA) 108 (90 Base) MCG/ACT inhaler INHALE 2 PUFFS EVERY 6 HOURS AS NEEDED FOR WHEEZE/SHORTNESS OF BREATH 6.7 each 2   amLODipine (NORVASC) 5 MG tablet Take 5 mg by mouth daily.     benzonatate  (TESSALON ) 200 MG capsule Take 1 capsule (200 mg total) by mouth 3 (three) times daily as needed for cough. 30 capsule 1   budesonide -formoterol  (SYMBICORT ) 160-4.5 MCG/ACT inhaler Inhale 2 puffs into the lungs in the morning and at bedtime. 1 each 6   clobetasol ointment (TEMOVATE) 0.05 % Apply 1 application topically 2 (two) times daily.     dorzolamide-timolol (COSOPT) 22.3-6.8 MG/ML ophthalmic solution 1 drop 2 (two) times daily.     famotidine  (PEPCID ) 20 MG tablet Take 1 tablet (20 mg total) by mouth 2 (two) times daily. 180 tablet 1   Fezolinetant  (VEOZAH ) 45 MG TABS TAKE 1 TABLET BY MOUTH EVERY DAY 90 tablet 0   hydroxychloroquine (PLAQUENIL) 200 MG tablet Take 200 mg by mouth 2 (two) times daily.     latanoprost (XALATAN) 0.005 % ophthalmic solution Place 1 drop into both eyes at bedtime.     levocetirizine (XYZAL ) 5 MG tablet TAKE 1 TABLET BY MOUTH EVERY DAY IN THE EVENING 90 tablet 0   lidocaine (XYLOCAINE) 5 % ointment Apply topically.     magic mouthwash (nystatin , lidocaine, diphenhydrAMINE, alum & mag hydroxide) suspension Swish and spit 5 mLs 3 (three) times daily as needed for mouth pain. 180 mL 2   mycophenolate  (CELLCEPT ) 500 MG tablet Take 1 tablet (500 mg total) by mouth 2 (two) times daily. 60 tablet 2   pantoprazole  (PROTONIX ) 20 MG tablet Take 20 mg by mouth 2 (two) times daily.     Pirfenidone  (ESBRIET ) 267 MG TABS Take 3 tablets (801 mg total) by mouth 3 (three) times daily with meals. Month 2 and onwards: take 3 tablets by mouth three times daily with meals 270 tablet 5    promethazine -dextromethorphan (PROMETHAZINE -DM) 6.25-15 MG/5ML syrup Take 5 mLs by mouth 4 (four) times daily as needed for cough. 118 mL 0   SIMBRINZA 1-0.2 % SUSP Apply 16 mLs to eye in the morning and at bedtime.     simethicone (MYLICON) 80 MG chewable tablet Chew 80 mg by mouth.     Spacer/Aero-Holding Chambers DEVI 1 each by Does not apply route as needed. 1 each 0   sucralfate  (CARAFATE ) 1 g tablet TAKE 1 TABLET THREE TIMES DAILY WITH MEALS AS NEEDED 270 tablet 1   sulfamethoxazole -trimethoprim  (BACTRIM  DS) 800-160 MG tablet TAKE 1 TABLET BY MOUTH THREE TIMES A WEEK 39 tablet 1   No current facility-administered medications on file prior to visit.  [2]  Allergies Allergen Reactions   Nintedanib Diarrhea and Nausea Only    Dizziness, fatigue   Prilosec [Omeprazole ] Other (See Comments)    Alopecia   Famotidine  Anxiety and Palpitations   Premarin [Estrogens Conjugated] Rash

## 2024-10-18 ENCOUNTER — Ambulatory Visit: Payer: Self-pay | Admitting: Internal Medicine

## 2024-10-19 ENCOUNTER — Telehealth: Payer: Self-pay | Admitting: Internal Medicine

## 2024-10-19 NOTE — Telephone Encounter (Signed)
 PC placed to pt today. LVMM on her cell [phone]) letting her know that the BS reading of 180 that she saw on her Mychart was removed. This was entered in error. Mychart message also sent.

## 2024-11-15 ENCOUNTER — Ambulatory Visit

## 2024-11-15 DIAGNOSIS — J849 Interstitial pulmonary disease, unspecified: Secondary | ICD-10-CM | POA: Diagnosis not present

## 2024-11-15 DIAGNOSIS — Z5181 Encounter for therapeutic drug level monitoring: Secondary | ICD-10-CM

## 2024-11-15 LAB — PULMONARY FUNCTION TEST
DL/VA % pred: 70 %
DL/VA: 2.96 ml/min/mmHg/L
DLCO unc % pred: 44 %
DLCO unc: 10.32 ml/min/mmHg
FEF 25-75 Post: 2.01 L/s
FEF 25-75 Pre: 1.52 L/s
FEF2575-%Change-Post: 32 %
FEF2575-%Pred-Post: 71 %
FEF2575-%Pred-Pre: 53 %
FEV1-%Change-Post: 6 %
FEV1-%Pred-Post: 72 %
FEV1-%Pred-Pre: 67 %
FEV1-Post: 2.18 L
FEV1-Pre: 2.05 L
FEV1FVC-%Change-Post: 7 %
FEV1FVC-%Pred-Pre: 94 %
FEV6-%Change-Post: 0 %
FEV6-%Pred-Post: 71 %
FEV6-%Pred-Pre: 72 %
FEV6-Post: 2.68 L
FEV6-Pre: 2.71 L
FEV6FVC-%Change-Post: 0 %
FEV6FVC-%Pred-Post: 102 %
FEV6FVC-%Pred-Pre: 102 %
FVC-%Change-Post: -1 %
FVC-%Pred-Post: 69 %
FVC-%Pred-Pre: 70 %
FVC-Post: 2.69 L
FVC-Pre: 2.72 L
Post FEV1/FVC ratio: 81 %
Post FEV6/FVC ratio: 100 %
Pre FEV1/FVC ratio: 75 %
Pre FEV6/FVC Ratio: 100 %
RV % pred: 71 %
RV: 1.43 L
TLC % pred: 73 %
TLC: 4.04 L

## 2024-11-15 NOTE — Progress Notes (Signed)
 Full pft performed today

## 2024-11-15 NOTE — Patient Instructions (Signed)
 Full pft performed today

## 2024-11-17 ENCOUNTER — Other Ambulatory Visit: Payer: Self-pay | Admitting: Internal Medicine

## 2024-11-18 ENCOUNTER — Other Ambulatory Visit: Payer: Self-pay | Admitting: Internal Medicine

## 2024-11-18 DIAGNOSIS — R232 Flushing: Secondary | ICD-10-CM

## 2024-11-22 ENCOUNTER — Telehealth: Payer: Self-pay

## 2024-11-22 NOTE — Telephone Encounter (Signed)
 Received a form from [facility] stating patient is needing a refill on Symbicort . Patient states she not needing any refill at this time. nfn

## 2024-11-26 ENCOUNTER — Ambulatory Visit: Admitting: Pulmonary Disease

## 2024-11-26 ENCOUNTER — Encounter: Payer: Self-pay | Admitting: Pulmonary Disease

## 2024-11-26 VITALS — BP 108/74 | HR 98 | Temp 98.3°F | Ht 67.0 in | Wt 214.0 lb

## 2024-11-26 DIAGNOSIS — M359 Systemic involvement of connective tissue, unspecified: Secondary | ICD-10-CM | POA: Diagnosis not present

## 2024-11-26 DIAGNOSIS — K219 Gastro-esophageal reflux disease without esophagitis: Secondary | ICD-10-CM

## 2024-11-26 DIAGNOSIS — R0982 Postnasal drip: Secondary | ICD-10-CM | POA: Diagnosis not present

## 2024-11-26 DIAGNOSIS — R062 Wheezing: Secondary | ICD-10-CM

## 2024-11-26 DIAGNOSIS — T380X5S Adverse effect of glucocorticoids and synthetic analogues, sequela: Secondary | ICD-10-CM | POA: Diagnosis not present

## 2024-11-26 DIAGNOSIS — Z5181 Encounter for therapeutic drug level monitoring: Secondary | ICD-10-CM

## 2024-11-26 DIAGNOSIS — R0689 Other abnormalities of breathing: Secondary | ICD-10-CM

## 2024-11-26 DIAGNOSIS — J849 Interstitial pulmonary disease, unspecified: Secondary | ICD-10-CM | POA: Diagnosis not present

## 2024-11-26 NOTE — Progress Notes (Unsigned)
 "              Chelsea Jennings    [id number]    May 16, 1971  Primary Care Physician:Johnson, Barnie NOVAK, MD  Referring Physician: Vicci Barnie NOVAK, MD [address] [place] [address] [place],  [place] [address]   Chief complaint:  Follow-up for CTD related interstitial lung disease, NSIP On MMF and pirfenidone  since 2023  HPI: 54 y.o.  with history of hypertension, unspecified connective tissue disease, NSIP interstitial lung disease here to establish care She was previously followed by Dr.  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  She was put on prednisone  40 mg and then MMF which is titrated up to 1 g twice daily.  She was able to be weaned off prednisone  in December 2021 with improvement in symptoms of dyspnea. She also follows with Dr. Francena Cramp, rheumatology at the [facility] and is on Plaquenil. She completed pulmonary rehab in 2021  States that her dyspnea on exertion has been stable on CellCept .  She is also on Bactrim  for prophylaxis. Had echocardiogram in 2021 at the [facility] with no evidence of pulmonary hypertension  She also has history of GERD and was on omeprazole  but developed side effects of rash.  She is currently on pantoprazole  20 mg a day which appears to control her symptoms Postnasal drip was treated with over-the-counter antihistamines and nasal spray.  She was on chlorpheniramine which at least helped with her anxiety.  2023 She developed COVID-19 in January 2023 and treated as an outpatient with Paxlovid.  Subsequently she had treatment with Z-Pak and amoxicillin  for persistent symptoms.    Started nintedanib in July 2023.  This was poorly tolerated due to diarrhea and fatigue Started pirfenidone  in November 2023 which is better tolerated She is on PPI twice daily for cough  2025 MMF dose reduced to 500 mg twice daily due to leukopenia  Interim History: Discussed the use of AI scribe  software for clinical note transcription with the patient, who gave verbal consent to proceed.  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) - Currently under rheumatologic care at [facility]  Cough - Chronic cough attributed to acid reflux - Cough improved by approximately 60% with prednisone   Wheezing - Wheeze present - Uses inhaler as needed and requests a spacer for improved effectiveness  Immunosuppressive therapy and medication adjustment - Current medications include MMF, pirfenidone , and hydroxychloroquine - Cellcept  dosage reduced due to low white blood cell count   Chelsea Jennings is a 54 year old female with interstitial lung disease who presents for follow-up of her respiratory symptoms.  Cough and respiratory symptoms - Cough worsened in September, improved with antibiotics and prednisone , and resolved around Thanksgiving - No current cough - No ongoing prednisone  use  Chest tightness and inhaler use - Uses Symbicort  for chest tightness, finds it helpful - Experiences mouth irritation despite spacer use and gargling - Currently uses Symbicort  as needed instead of daily  Immunosuppressive and antifibrotic therapy - Takes Cellcept  500 mg twice daily and Esbriet  for interstitial lung disease - Cellcept  dose previously reduced  due to decreased blood counts - Recent laboratory studies show stable blood counts   Relevant pulmonary history Pets: She is to have cats in the past.  No pets currently Occupation: Works for [employer].  Previously in the  [employer] Exposures: Exposed to chemicals at [employer] camp in [place]  Smoking history: Never smoker Travel history: No significant recent travel Relevant family history:No significant family history of lung disease  Outpatient Encounter Medications as of 11/26/2024  Medication Sig   albuterol  (VENTOLIN  HFA) 108 (90 Base) MCG/ACT inhaler INHALE 2 PUFFS EVERY 6 HOURS AS NEEDED FOR WHEEZE/SHORTNESS OF BREATH   amLODipine (NORVASC) 5 MG tablet Take 5 mg by mouth daily.   benzonatate  (TESSALON ) 200 MG capsule Take 1 capsule (200 mg total) by mouth 3 (three) times daily as needed for cough.   budesonide -formoterol  (SYMBICORT ) 160-4.5 MCG/ACT inhaler Inhale 2 puffs into the lungs in the morning and at bedtime.   clobetasol ointment (TEMOVATE) 0.05 % Apply 1 application topically 2 (two) times daily.   cyclobenzaprine  (FLEXERIL ) 5 MG tablet Take 1 tablet (5 mg total) by mouth 2 (two) times daily as needed for muscle spasms.   famotidine  (PEPCID ) 20 MG tablet Take 1 tablet (20 mg total) by mouth 2 (two) times daily.   hydroxychloroquine (PLAQUENIL) 200 MG tablet Take 200 mg by mouth 2 (two) times daily.   levocetirizine (XYZAL ) 5 MG tablet TAKE 1 TABLET BY MOUTH EVERY DAY IN THE EVENING   lidocaine (XYLOCAINE) 5 % ointment Apply topically.   mycophenolate  (CELLCEPT ) 500 MG tablet Take 1 tablet (500 mg total) by mouth 2 (two) times daily.   pantoprazole  (PROTONIX ) 20 MG tablet Take 20 mg by mouth 2 (two) times daily.   Pirfenidone  (ESBRIET ) 267 MG TABS Take 3 tablets (801 mg total) by mouth 3 (three) times daily with meals. Month 2 and onwards: take 3 tablets by mouth three times daily with meals   promethazine -dextromethorphan (PROMETHAZINE -DM) 6.25-15 MG/5ML syrup Take 5 mLs by mouth 4 (four) times daily as needed for cough.   SIMBRINZA 1-0.2 % SUSP Apply 16 mLs to eye in the morning and at bedtime.   sucralfate  (CARAFATE ) 1 g tablet TAKE 1 TABLET THREE TIMES DAILY WITH MEALS AS NEEDED    sulfamethoxazole -trimethoprim  (BACTRIM  DS) 800-160 MG tablet TAKE 1 TABLET BY MOUTH THREE TIMES A WEEK   VEOZAH  45 MG TABS TAKE 1 TABLET BY MOUTH EVERY DAY   dorzolamide-timolol (COSOPT) 22.3-6.8 MG/ML ophthalmic solution 1 drop 2 (two) times daily. (Patient not taking: Reported on 11/26/2024)   latanoprost (XALATAN) 0.005 % ophthalmic solution Place 1 drop into both eyes at bedtime. (Patient not taking: Reported on 11/26/2024)   simethicone (MYLICON) 80 MG chewable tablet Chew 80 mg by mouth. (Patient not taking: Reported on 11/26/2024)   Spacer/Aero-Holding Chambers DEVI 1 each by Does not apply route as needed. (Patient not taking: Reported on 11/26/2024)   No facility-administered encounter medications on file as of 11/26/2024.    Vitals:   11/26/24 1539  BP: 108/74  Pulse: 98  Temp: 98.3 F (36.8 C)  Height: 5' 7 (1.702 m)  Weight: 214 lb (97.1 kg)  SpO2: 98%  TempSrc: Oral  BMI (Calculated): 33.51     Physical Exam GEN: No acute distress CV: Regular rate and rhythm no murmurs LUNGS: Clear to auscultation bilaterally normal respiratory effort SKIN JOINTS: Warm and dry no rash     Data Reviewed: Reviewed data from the [facility] PFTs 2015-FVC 3.05 [79%], TLC 4.39 [80%], DLCO 65% 02/2020-FVC 2.47 [  72%], TLC 3.39 [60%], DLCO 42%  CT chest 03/2020-baseline ILD with subpleural sparing, groundglass opacities with traction bronchiectasis.  Cysts versus honeycombing at the base.  Worsening compared to 2019  Imaging: High-res CT 09/14/2021-probable UIP pattern interstitial pneumonia, soft tissue mass in the anterior mediastinum, low-attenuation lesion in the liver.  MRI abdomen 10/13/2021-benign hepatic hemangiomas in the liver  CT chest 05/11/2022-Mild progression of interstitial lung disease with suggestion of honeycombing.  CT high-resolution 12/01/2023-stable findings of interstitial lung disease in alternate pattern, likely NSIP  CT chest 08/26/2024-interstitial lung disease, alternate  diagnosis.  Stable compared to June 2025 I have reviewed the images personally.  PFTs: 11/10/2021 FVC 2.55 [78%], FEV1 2.20 [84%], F/F 86, TLC 3.75 [68%], DLCO 11.22 [48%] Mild restriction, moderate-severe diffusion defect  12/12/2023 FVC 2.62 [69%], FEV1 2.05 [67%], DLCO 14.10 [61%] Restrictive physiology, moderate diffusion defect  11/15/2024 FVC 2.69 [69%], FEV1 2.18 [72%], F/F81, TLC 4.04 [73%], DLCO 10.32 [44%] Moderate restriction, severe diffusion defect  Labs: CMP 08/24/2021-within normal limits CBC 02/07/2022-WBC slightly lower at 3.7 CTD serologies 08/24/2021-ANA 1:1280  Cardiac: Echocardiogram May 2021-no pulmonary hypertension, positive for diastolic dysfunction  Assessment & Plan 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 Symptoms improved with prednisone  and antibiotics. Current treatment includes MMF and pirfenidone .  MMF dose was reduced in early January 2025 due to low WBC count.  CT is stable. Lung volumes have improved, but diffusion capacity has slightly worsened. Blood counts are stable after dose adjustment of Cellcept . - Ordered echocardiogram to monitor for pulmonary hypertension.  Last echocardiogram was in 2021 - Ordered CT scan in six months - Checked blood counts today  Asthma-like symptoms (wheezing) Wheezing noted, possibly due to past viral infection infection causing asthma-like symptoms. - Continue Symbicort  with spacer.  She is using it as needed - Provide spacer for inhaler use  Adverse effect of inhaled corticosteroid therapy Symbicort  causes oral irritation despite using a spacer and gargling with Listerine. She uses it as needed for rescue rather than daily. - Continue Symbicort  as needed for rescue inhalation  GERD, postnasal drip Continue PPI twice daily, over-the-counter antihistamine  Liver lesions Noted on  high-res CT Follow-up MRI shows benign hemangiomas  Follow-up - Schedule follow-up appointment in 6 months  Plan/Recommendations: Continue MMF, Bactrim , Plaquenil Continue pirfenidone  Labs for monitoring Symbicort  with spacer Follow-up high-res CT  I personally spent a total of 40 minutes in the care of the patient today including preparing to see the patient, performing a medically appropriate exam/evaluation, placing orders, documenting clinical information in the EHR, independently interpreting results, communicating results, and coordinating care.   Lonna Coder MD [facility] 11/26/2024, 3:56 PM  CC: Vicci Barnie NOVAK, MD     "

## 2024-11-26 NOTE — Patient Instructions (Signed)
" °  VISIT SUMMARY: Today, we discussed your interstitial lung disease and respiratory symptoms. Your condition is well-managed, and we reviewed your current medications and their effects.  YOUR PLAN: INTERSTITIAL LUNG DISEASE: Your lung disease is stable with improved lung volumes, though diffusion capacity has slightly worsened. -Continue taking Cellcept  500 mg twice daily and Esbriet  as prescribed. -We checked your blood counts today to ensure they remain stable. -An echocardiogram has been ordered to monitor for pulmonary hypertension. -A CT scan has been scheduled for six months from now.  ADVERSE EFFECT OF INHALED CORTICOSTEROID THERAPY: You experience mouth irritation from Symbicort  despite using a spacer and gargling. -Continue using Symbicort  as needed for chest tightness.  PULMONARY HYPERTENSION (SUSPECTED): There is a suspicion of pulmonary hypertension due to low diffusion capacity. -An echocardiogram has been ordered to evaluate for pulmonary hypertension.    Contains text generated by [employer].   "

## 2024-11-27 ENCOUNTER — Telehealth: Payer: Self-pay | Admitting: Pulmonary Disease

## 2024-11-27 LAB — COMPREHENSIVE METABOLIC PANEL WITH GFR
ALT: 18 U/L (ref 3–35)
AST: 17 U/L (ref 5–37)
Albumin: 4.3 g/dL (ref 3.5–5.2)
Alkaline Phosphatase: 37 U/L — ABNORMAL LOW (ref 39–117)
BUN: 11 mg/dL (ref 6–23)
CO2: 29 meq/L (ref 19–32)
Calcium: 9.9 mg/dL (ref 8.4–10.5)
Chloride: 104 meq/L (ref 96–112)
Creatinine, Ser: 0.81 mg/dL (ref 0.40–1.20)
GFR: 82.86 mL/min
Glucose, Bld: 71 mg/dL (ref 70–99)
Potassium: 4 meq/L (ref 3.5–5.1)
Sodium: 139 meq/L (ref 135–145)
Total Bilirubin: 0.4 mg/dL (ref 0.2–1.2)
Total Protein: 8 g/dL (ref 6.0–8.3)

## 2024-11-27 LAB — CBC WITH DIFFERENTIAL/PLATELET
Basophils Absolute: 0 K/uL (ref 0.0–0.1)
Basophils Relative: 0.8 % (ref 0.0–3.0)
Eosinophils Absolute: 0 K/uL (ref 0.0–0.7)
Eosinophils Relative: 0.6 % (ref 0.0–5.0)
HCT: 36.7 % (ref 36.0–46.0)
Hemoglobin: 12.1 g/dL (ref 12.0–15.0)
Lymphocytes Relative: 15.8 % (ref 12.0–46.0)
Lymphs Abs: 0.8 K/uL (ref 0.7–4.0)
MCHC: 32.9 g/dL (ref 30.0–36.0)
MCV: 89 fl (ref 78.0–100.0)
Monocytes Absolute: 0.5 K/uL (ref 0.1–1.0)
Monocytes Relative: 9.4 % (ref 3.0–12.0)
Neutro Abs: 3.7 K/uL (ref 1.4–7.7)
Neutrophils Relative %: 73.4 % (ref 43.0–77.0)
Platelets: 311 K/uL (ref 150.0–400.0)
RBC: 4.12 Mil/uL (ref 3.87–5.11)
RDW: 13.9 % (ref 11.5–15.5)
WBC: 5.1 K/uL (ref 4.0–10.5)

## 2024-11-27 NOTE — Telephone Encounter (Signed)
 Waiting on fax from [facility]

## 2024-11-27 NOTE — Telephone Encounter (Signed)
 Copied from CRM #[id number]. Topic: General - Other >> Nov 27, 2024  9:01 AM Corean SAUNDERS wrote: Reason for CRM: Patient states Dr. Theophilus ordered a echocardi[facility] for [facility] but [facility] is [facility] a request for additional services along with office notes in order for this to be covered.  Patient is kindly requesting an update when this is completed so that she may move forward with scheduling her echocardiogram.

## 2024-11-29 NOTE — Telephone Encounter (Signed)
 Copied from CRM #[id number]. Topic: General - Other >> Nov 27, 2024  9:01 AM Corean SAUNDERS wrote: Reason for CRM: Patient states Dr. Theophilus ordered a echocardiogram for her but the [facility] is requiring a request for additional services along with office notes in order for this to be covered.  Patient is kindly requesting an update when this is completed so that she may move forward with scheduling her echocardiogram. >> Nov 29, 2024  9:49 AM Leila BROCKS wrote: Patient [phone] states called yesterday and is checking in if Dr. Theophilus orderd a echocardiogram, [facility] needs additional information from Dr. Theophilus before patient can schedule echocardiogram. Patient states the [facility] fax number is the same as the [facility] authorization listed. Please advise and call back.

## 2024-12-04 NOTE — Telephone Encounter (Signed)
 Patient is calling because she called in last week and to request the additional service authorization forms to be faxed to the va and the va say they have not received them and spoke with someone last week and they said they were waiting for the va and someone will call patient back and patient has not heard from anyone . Patient is needing for these forms to be faxed to the va asap . [facility] is waiting on forms . The forms and patient last clinical notes faxed over  [phone]

## 2024-12-05 NOTE — Telephone Encounter (Addendum)
 Called- Echocardiogram order signed by Dr Theophilus with office note. Received fax confirmation. Nfn

## 2024-12-08 ENCOUNTER — Other Ambulatory Visit: Payer: Self-pay | Admitting: Pulmonary Disease

## 2024-12-10 ENCOUNTER — Telehealth: Payer: Self-pay | Admitting: Pulmonary Disease

## 2024-12-10 NOTE — Telephone Encounter (Signed)
 Called- Echocardiogram order signed by Dr Theophilus with office note. Received fax confirmation. Nfn

## 2024-12-10 NOTE — Telephone Encounter (Signed)
 Copied from CRM #[id number]. Topic: General - Other >> Dec 10, 2024  9:43 AM [name] wrote: Reason for CRM: Patient calling for [facility].  States she has not heard from her.  Please call patient regarding application for additional services.  Call [phone]

## 2025-02-21 ENCOUNTER — Ambulatory Visit: Payer: Self-pay | Admitting: Internal Medicine

## 2025-05-26 ENCOUNTER — Other Ambulatory Visit
